# Patient Record
Sex: Male | Born: 1960 | Race: White | Hispanic: No | Marital: Married | Smoking: Never smoker
Health system: Southern US, Community
[De-identification: ages and names within clinical notes are randomized; demographics above are authoritative.]

## PROBLEM LIST (undated history)

## (undated) DIAGNOSIS — R5381 Other malaise: Secondary | ICD-10-CM

## (undated) DIAGNOSIS — J0100 Acute maxillary sinusitis, unspecified: Secondary | ICD-10-CM

## (undated) DIAGNOSIS — E785 Hyperlipidemia, unspecified: Secondary | ICD-10-CM

## (undated) DIAGNOSIS — J069 Acute upper respiratory infection, unspecified: Secondary | ICD-10-CM

## (undated) DIAGNOSIS — K219 Gastro-esophageal reflux disease without esophagitis: Secondary | ICD-10-CM

## (undated) DIAGNOSIS — Z136 Encounter for screening for cardiovascular disorders: Secondary | ICD-10-CM

## (undated) DIAGNOSIS — R5383 Other fatigue: Secondary | ICD-10-CM

## (undated) DIAGNOSIS — I10 Essential (primary) hypertension: Secondary | ICD-10-CM

## (undated) DIAGNOSIS — K644 Residual hemorrhoidal skin tags: Secondary | ICD-10-CM

## (undated) DIAGNOSIS — J9383 Other pneumothorax: Secondary | ICD-10-CM

## (undated) DIAGNOSIS — R609 Edema, unspecified: Secondary | ICD-10-CM

## (undated) HISTORY — PX: LAPAROSCOPIC INGUINAL HERNIA REPAIR: SUR788

## (undated) HISTORY — DX: Other pneumothorax: J93.83

## (undated) HISTORY — DX: Residual hemorrhoidal skin tags: K64.4

## (undated) HISTORY — DX: Hyperlipidemia, unspecified: E78.5

## (undated) HISTORY — DX: Gastro-esophageal reflux disease without esophagitis: K21.9

## (undated) HISTORY — DX: Acute maxillary sinusitis, unspecified: J01.00

## (undated) HISTORY — DX: Acute upper respiratory infection, unspecified: J06.9

## (undated) HISTORY — PX: TALC PLEURODESIS: SHX2506

## (undated) HISTORY — DX: Other malaise: R53.83

## (undated) HISTORY — DX: Edema, unspecified: R60.9

## (undated) HISTORY — DX: Encounter for screening for cardiovascular disorders: Z13.6

## (undated) HISTORY — PX: INGUINAL HERNIA REPAIR: SUR1180

## (undated) HISTORY — DX: Essential (primary) hypertension: I10

## (undated) HISTORY — PX: OTHER SURGICAL HISTORY: SHX169

## (undated) HISTORY — DX: Other malaise: R53.81

## (undated) HISTORY — PX: HEMORRHOID SURGERY: SHX153

---

## 1985-03-01 HISTORY — PX: THORACOTOMY: SUR1349

## 1986-03-01 HISTORY — PX: COLOSTOMY REVERSAL: SHX5782

## 1986-03-01 HISTORY — PX: EXPLORATORY LAPAROTOMY: SUR591

## 1995-03-02 HISTORY — PX: THORACOTOMY: SUR1349

## 2003-11-30 ENCOUNTER — Encounter: Payer: Self-pay | Admitting: General Practice

## 2003-12-31 ENCOUNTER — Encounter: Payer: Self-pay | Admitting: General Practice

## 2004-03-01 DIAGNOSIS — Z136 Encounter for screening for cardiovascular disorders: Secondary | ICD-10-CM

## 2004-03-01 HISTORY — DX: Encounter for screening for cardiovascular disorders: Z13.6

## 2004-05-04 ENCOUNTER — Ambulatory Visit: Payer: Self-pay | Admitting: Family Medicine

## 2004-05-11 ENCOUNTER — Ambulatory Visit: Payer: Self-pay | Admitting: Family Medicine

## 2004-11-16 ENCOUNTER — Ambulatory Visit: Payer: Self-pay | Admitting: Urology

## 2009-07-22 LAB — HEPATIC FUNCTION PANEL
ALT: 59 U/L — AB (ref 10–40)
AST: 41 U/L — AB (ref 14–40)
Bilirubin, Total: 1.3 mg/dL

## 2010-03-01 HISTORY — PX: INGUINAL HERNIA REPAIR: SUR1180

## 2010-10-15 ENCOUNTER — Encounter: Payer: Self-pay | Admitting: Internal Medicine

## 2010-10-26 ENCOUNTER — Encounter: Payer: Self-pay | Admitting: Internal Medicine

## 2010-10-26 ENCOUNTER — Other Ambulatory Visit: Payer: Self-pay | Admitting: Internal Medicine

## 2010-10-26 ENCOUNTER — Ambulatory Visit (INDEPENDENT_AMBULATORY_CARE_PROVIDER_SITE_OTHER): Payer: Managed Care, Other (non HMO) | Admitting: Internal Medicine

## 2010-10-26 DIAGNOSIS — R079 Chest pain, unspecified: Secondary | ICD-10-CM

## 2010-10-26 DIAGNOSIS — E785 Hyperlipidemia, unspecified: Secondary | ICD-10-CM

## 2010-10-26 DIAGNOSIS — Z79899 Other long term (current) drug therapy: Secondary | ICD-10-CM

## 2010-10-26 DIAGNOSIS — I10 Essential (primary) hypertension: Secondary | ICD-10-CM

## 2010-10-26 LAB — COMPREHENSIVE METABOLIC PANEL
ALT: 34 U/L (ref 0–53)
CO2: 29 mEq/L (ref 19–32)
Calcium: 9.3 mg/dL (ref 8.4–10.5)
Chloride: 105 mEq/L (ref 96–112)
GFR: 83.4 mL/min (ref >60.00)
Glucose, Bld: 97 mg/dL (ref 70–99)
Sodium: 141 mEq/L (ref 135–145)
Total Protein: 7.7 g/dL (ref 6.0–8.3)

## 2010-10-26 LAB — LIPID PANEL
Total CHOL/HDL Ratio: 3
Triglycerides: 51 mg/dL (ref 0.0–149.0)

## 2010-10-26 NOTE — Patient Instructions (Signed)
I am referring you for a CARDIOLOGIC EVALUATION OF YOUR CHEST PAIN. If your evaluation is normal, we will then refer you back to Dr Carl Best for your colonoscopy and to evaluate your esophagus for spasms.   We did not change any of your medications today

## 2010-10-26 NOTE — Progress Notes (Signed)
  Subjective:    Patient ID: Jonathan Simon, male    DOB: 1960-12-10, 50 y.o.   MRN: [id number]  HPI  Mr. Artman is a 50 yr old male with a history of hypertension and reflux , last seen Sept 2011 who presents with recurrent episodes of sharp left sided chest pain  accompanied by dizziness without nausea or diaphoresis.  He reports feeling hard heart beat but no racing or irregular pulse.  The episodes last  about 3 to 5 minutes occurring about once or twice daily for the past month and have been occurring both at rest and with exertion.  Has denies any  Overt reflux symptoms but has also noticed periodic episodes of dysphagia for all food types  and liquids.   Review of Systems  Constitutional: Negative for fever, chills, activity change, appetite change, fatigue and unexpected weight change.  Eyes: Negative for visual disturbance.  Respiratory: Negative for cough and shortness of breath.   Cardiovascular: Negative for chest pain, palpitations and leg swelling.  Gastrointestinal: Negative for abdominal pain and abdominal distention.  Genitourinary: Negative for dysuria, urgency and difficulty urinating.  Musculoskeletal: Negative for arthralgias and gait problem.  Skin: Negative for color change and rash.  Hematological: Negative for adenopathy.  Psychiatric/Behavioral: Negative for sleep disturbance and dysphoric mood. The patient is not nervous/anxious.        Objective:   Physical Exam  Constitutional: He is oriented to person, place, and time. He appears well-developed and well-nourished.  HENT:  Head: Normocephalic.  Eyes: EOM are normal. Pupils are equal, round, and reactive to light.  Neck: Normal range of motion. No JVD present. No thyromegaly present.  Cardiovascular: Normal rate, regular rhythm and normal heart sounds.   Pulmonary/Chest: Effort normal and breath sounds normal.  Abdominal: Soft. Bowel sounds are normal. There is no tenderness. There is no rebound.    Musculoskeletal: Normal range of motion.  Lymphadenopathy:    He has no cervical adenopathy.  Neurological: He is alert and oriented to person, place, and time. He has normal reflexes.  Skin: Skin is warm and dry.  Psychiatric: He has a normal mood and affect.          Assessment & Plan:  Chest pain:  New onset, atypical , with cardiac risk factors of  HTN and relative hyperlipidemia (LDL 96 today).  EKG in office shows TWI in lead IIII only and his last episode was yesterday. Since the episodes are accomopanied by dizziness, we also need to rule out arrhythmias.  Will refer to Dossie Arbour for cardiac evaluation.  If cardiac eval is normal, will proceed with GI evaluation for esophageal spasm and overdue colonoscopy.   Hypertension:  Currently controlled,  No changes to regimen.   Hyperlipidemia: relative, LDL 96 which in the absenceof heart disease is fine.   will defer treatment for now.      Reflux:  continue omeprazole daily

## 2010-10-27 ENCOUNTER — Encounter: Payer: Self-pay | Admitting: Internal Medicine

## 2010-10-27 DIAGNOSIS — Z136 Encounter for screening for cardiovascular disorders: Secondary | ICD-10-CM | POA: Insufficient documentation

## 2010-10-27 DIAGNOSIS — J9383 Other pneumothorax: Secondary | ICD-10-CM | POA: Insufficient documentation

## 2010-10-27 LAB — TROPONIN I: Troponin I: <0.01 ng/mL (ref <0.06)

## 2010-10-27 NOTE — Progress Notes (Signed)
Can you check and see if the consult was placed for me in [facility] patient thx

## 2010-10-30 LAB — TROPONIN I: Troponin I: <0.01 ng/mL (ref <0.06)

## 2010-11-09 ENCOUNTER — Other Ambulatory Visit: Payer: Self-pay | Admitting: *Deleted

## 2010-11-09 MED ORDER — AMLODIPINE BESYLATE 10 MG PO TABS: 10.0000 mg | ORAL_TABLET | Freq: Every day | ORAL | Status: DC

## 2010-11-09 NOTE — Telephone Encounter (Signed)
Received faxed refill request from pharmacy. Is it okay to refill Amlodipine?

## 2010-11-10 ENCOUNTER — Encounter: Payer: Self-pay | Admitting: Cardiovascular Disease

## 2010-11-10 ENCOUNTER — Encounter: Payer: Self-pay | Admitting: *Deleted

## 2010-11-10 ENCOUNTER — Ambulatory Visit (INDEPENDENT_AMBULATORY_CARE_PROVIDER_SITE_OTHER): Payer: Managed Care, Other (non HMO) | Admitting: Cardiovascular Disease

## 2010-11-10 DIAGNOSIS — R079 Chest pain, unspecified: Secondary | ICD-10-CM | POA: Insufficient documentation

## 2010-11-10 DIAGNOSIS — R42 Dizziness and giddiness: Secondary | ICD-10-CM

## 2010-11-10 NOTE — Progress Notes (Signed)
Exercise Treadmill Test  Treadmill ordered for recent epsiodes of chest pain and dizziness.  Resting EKG shows NSR with rate of 63 bpm, no significant ST or T wave changes Resting blood pressure of 1230/80. Stand bruce protocal was used.  Patient exercised for 10 min Peak heart rate of 162 bpm.  This was 96% of the maximum predicted heart rate (target heart rate 168). No symptoms of chest pain or lightheadedness were reported at peak stress or in recovery.  Peak Blood pressure recorded was 164/98. Heart rate at 3 minutes in recovery was 79 bpm.  FINAL IMPRESSION: Normal exercise stress test. No significant EKG changes concerning for ischemia. Excellent exercise tolerance.

## 2010-11-10 NOTE — Progress Notes (Signed)
Patient ID: Jonathan Simon, male    DOB: 09-21-1960, 50 y.o.   MRN: [id number]  HPI Comments: Jonathan Simon is a very pleasant 50 year old gentleman, patient of Dr. Darrick Huntsman, who presents by referral for symptoms of arm pain, chest pain and dizziness.  He reports that he has had symptoms for 2 months, approximately 40 episodes. It starts as a ache in his arm, radiating to his chest described as a light pressure. He feels lightheaded for approximately 15 minutes and then it resolves on its own. Typically this occurs while he is standing or doing something. He is able to be very active with minimal symptoms. For example, he can push while once or twice a week on a incline with no significant symptoms.  He does have some trouble swallowing, sometimes after eating and wonders if it could be related.  It is uncertain if his symptoms are secondary to previous treatment for pneumothorax with corrective surgery bilaterally.  He states that his father was a smoker and had a heart attack and passed away at age 35. Grandfather passed away at 54. He is otherwise healthy, active, no smoking history, no diabetes.   EKG shows normal sinus rhythm with rate 60 beats per minute with no significant ST or T wave changes ( borderline T wave in lead 3)   Outpatient Encounter Prescriptions as of 11/10/2010  Medication Sig Dispense Refill  . amLODipine (NORVASC) 10 MG tablet Take 1 tablet (10 mg total) by mouth daily.  90 tablet  3  . budesonide-formoterol (SYMBICORT) 160-4.5 MCG/ACT inhaler Inhale 2 puffs into the lungs daily.        . fluticasone (VERAMYST) 27.5 MCG/SPRAY nasal spray Place 2 sprays into the nose daily.        Marland Kitchen lisinopril (PRINIVIL,ZESTRIL) 40 MG tablet Take 40 mg by mouth daily.        Marland Kitchen losartan (COZAAR) 50 MG tablet Take 50 mg by mouth daily.        . niacin 500 MG tablet Take 500 mg by mouth daily with breakfast.        . omeprazole (PRILOSEC) 40 MG capsule Take 40 mg by mouth daily.        .  pseudoephedrine (SUDAFED) 30 MG tablet Take 30 mg by mouth every 6 (six) hours as needed.        . simvastatin (ZOCOR) 20 MG tablet Take 10 mg by mouth at bedtime.           Review of Systems  Constitutional: Negative.   HENT: Negative.   Eyes: Negative.   Respiratory: Negative.   Cardiovascular: Positive for chest pain.       Pain radiating to the arm on left  Gastrointestinal: Negative.   Musculoskeletal: Negative.   Skin: Negative.   Neurological: Positive for dizziness.  Hematological: Negative.   Psychiatric/Behavioral: Negative.   All other systems reviewed and are negative.    BP 157/94  Pulse 60  Ht 5\' 10"  (1.778 m)  Wt 210 lb (95.255 kg)  BMI 30.13 kg/m2  Physical Exam  Nursing note and vitals reviewed. Constitutional: He is oriented to person, place, and time. He appears well-developed and well-nourished.  HENT:  Head: Normocephalic.  Nose: Nose normal.  Mouth/Throat: Oropharynx is clear and moist.  Eyes: Conjunctivae are normal. Pupils are equal, round, and reactive to light.  Neck: Normal range of motion. Neck supple. No JVD present.  Cardiovascular: Normal rate, regular rhythm, S1 normal, S2 normal, normal heart sounds and intact  distal pulses.  Exam reveals no gallop and no friction rub.   No murmur heard. Pulmonary/Chest: Effort normal and breath sounds normal. No respiratory distress. He has no wheezes. He has no rales. He exhibits no tenderness.  Abdominal: Soft. Bowel sounds are normal. He exhibits no distension. There is no tenderness.  Musculoskeletal: Normal range of motion. He exhibits no edema and no tenderness.  Lymphadenopathy:    He has no cervical adenopathy.  Neurological: He is alert and oriented to person, place, and time. Coordination normal.  Skin: Skin is warm and dry. No rash noted. No erythema.  Psychiatric: He has a normal mood and affect. His behavior is normal. Judgment and thought content normal.           Assessment and Plan

## 2010-11-10 NOTE — Assessment & Plan Note (Signed)
He does have dizziness with his episodes of chest pain. Uncertain if this could be secondary to arrhythmia. I have asked him to by a automatic blood pressure cuff or borrow one and monitor his blood pressure and heart rate when he has these episodes. If he has hypotension or underlying arrhythmia have asked him to contact the office for further evaluation. If heart rhythm is abnormal, we could perform a Holter monitor. If he is hypotensive, we could change his blood pressure medication.

## 2010-11-10 NOTE — Patient Instructions (Signed)
We will perform a treadmill today to rule out heart blockage. Please monitor your blood pressure and heart rate at home when you have the episodes of chest pain and dizziness. Call the office if you have low blood pressure or arrhythmia/low heart rate.  If it appears that you are having arrhythmia, we will order a holter monitor.  No medication changes were made. Please call us if you have new issues that need to be addressed

## 2010-11-10 NOTE — Assessment & Plan Note (Signed)
Etiology of his chest pain is uncertain though somewhat atypical for cardiac. We will schedule him for a routine treadmill study. His symptoms seem to present at rest, not as much with exertion also ordering for noncardiac etiology. He does have some dysphasia and he may benefit from followup with GI.

## 2010-11-10 NOTE — Progress Notes (Signed)
Addended by: Phoebe Sharps on: 11/10/2010 01:07 PM   Modules accepted: Level of Service

## 2010-12-11 ENCOUNTER — Other Ambulatory Visit: Payer: Self-pay | Admitting: Internal Medicine

## 2010-12-11 MED ORDER — OMEPRAZOLE 40 MG PO CPDR: 40.0000 mg | DELAYED_RELEASE_CAPSULE | Freq: Every day | ORAL | Status: DC

## 2010-12-29 ENCOUNTER — Emergency Department: Payer: Self-pay | Admitting: Emergency Medicine

## 2010-12-29 IMAGING — CT CT ABD-PELV W/ CM
1 of 3 series · 14 of 32 positions shown, 19 images · IV contrast (isovue)
Comparison: none

REASON FOR EXAM: (1) rlq pain and loss of appetite xc 2 weeks; (2) abd
pain
COMMENTS:

PROCEDURE:     CT  - CT ABDOMEN / PELVIS  W  - [DATE]  [DATE]
RESULT:     Comparison:  None
TECHNIQUE: Multiple axial images of the abdomen and pelvis were performed
from the lung bases to the pubic symphysis, with p.o. contrast and with 100
mL of Isovue 370 intravenous contrast.

[Series 2: 3mm soft tissue · axial · 0.79mm/px · z∈[-584,-134]mm · 14 of 168 slices shown, 19 images]
[im 9/168  soft-tissue]
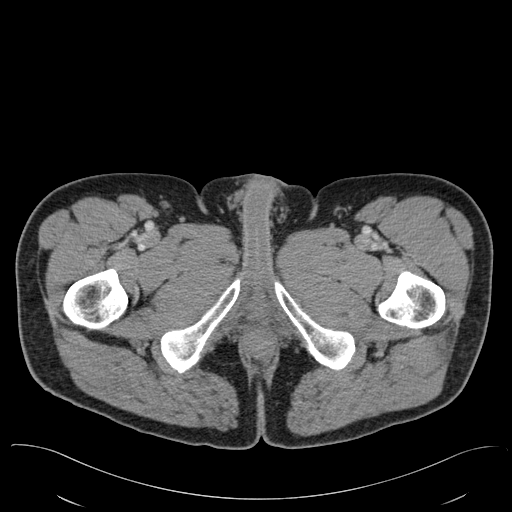
[im 9/168  bone]
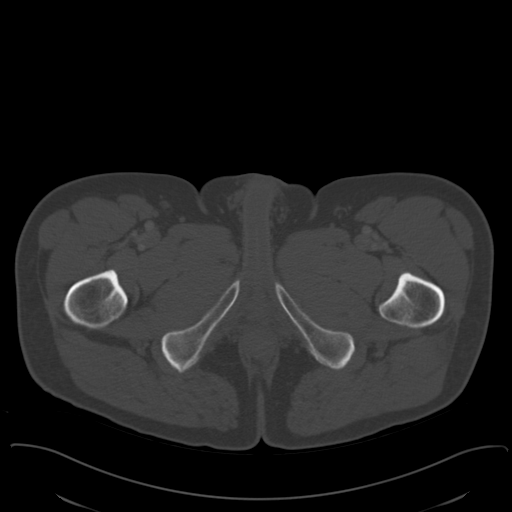
[im 27/168  soft-tissue]
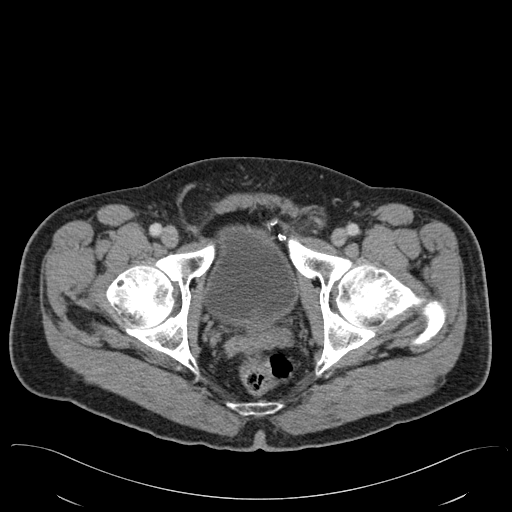
[im 36/168  soft-tissue]
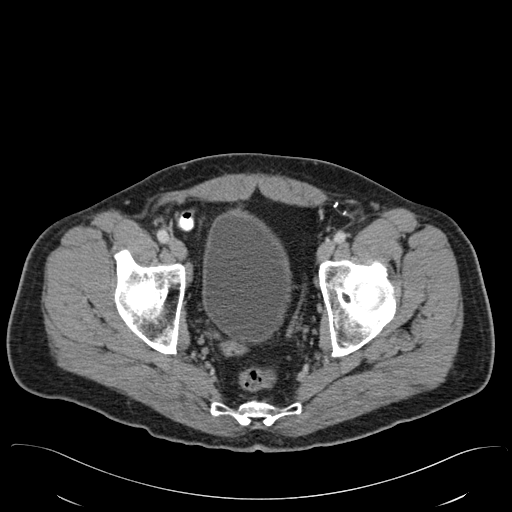
[im 44/168  soft-tissue]
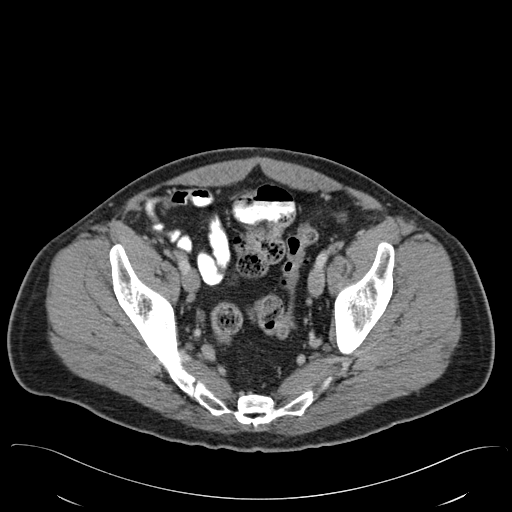
[im 62/168  soft-tissue]
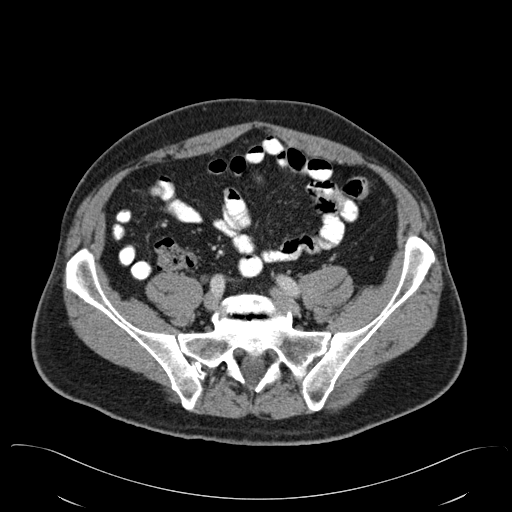
[im 71/168  soft-tissue]
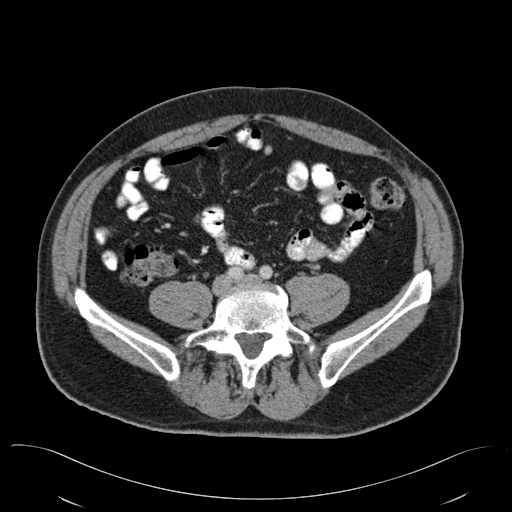
[im 88/168  soft-tissue]
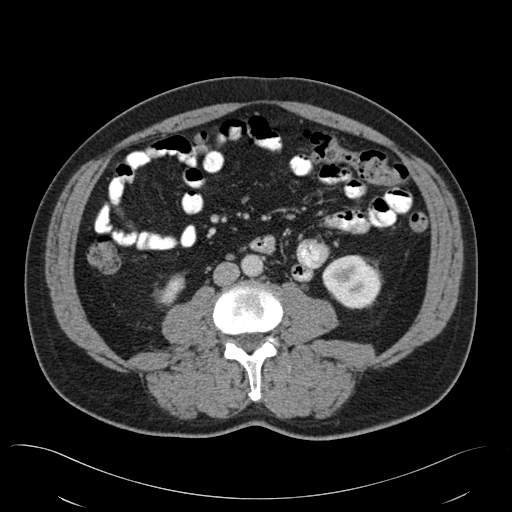
[im 97/168  soft-tissue]
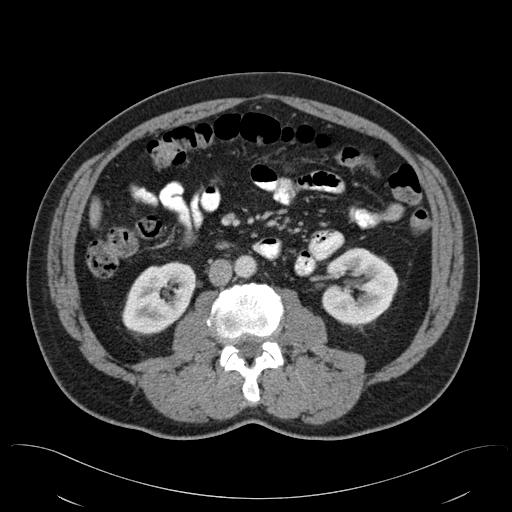
[im 106/168  soft-tissue]
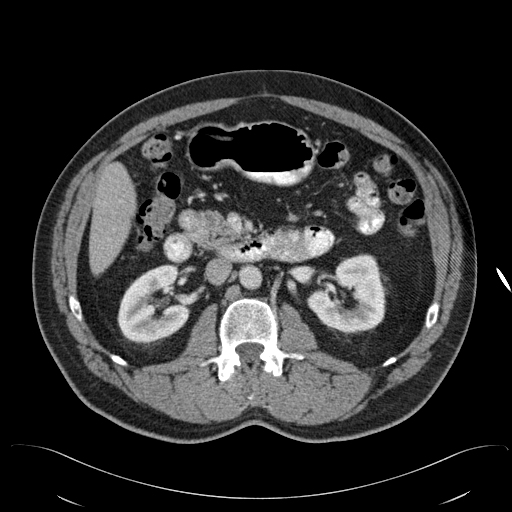
[im 106/168  bone]
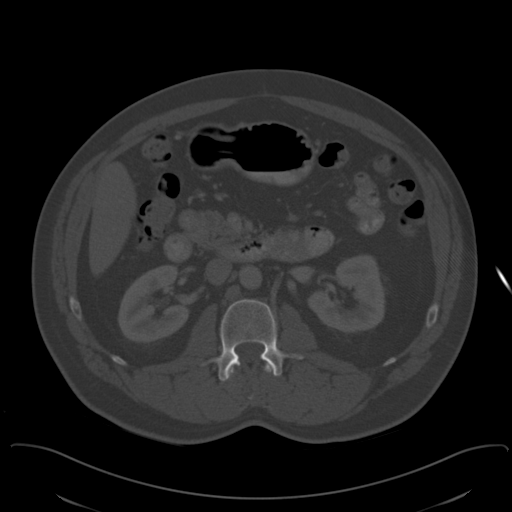
[im 124/168  soft-tissue]
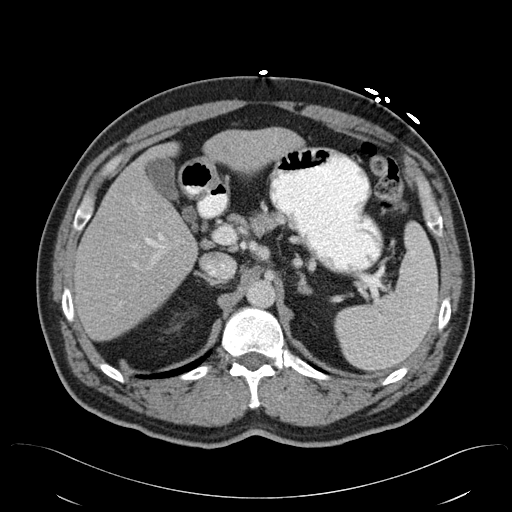
[im 132/168  soft-tissue]
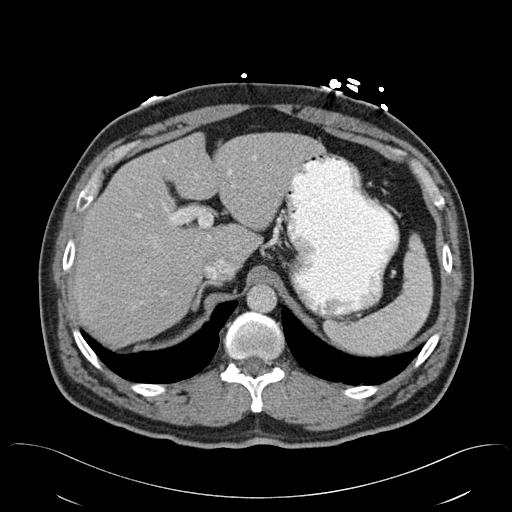
[im 132/168  lung]
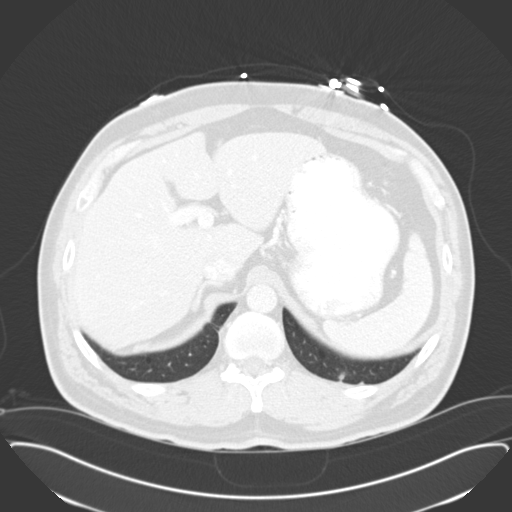
[im 141/168  soft-tissue]
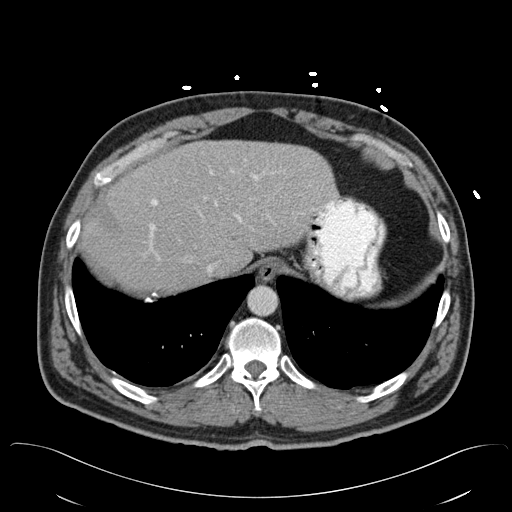
[im 141/168  lung]
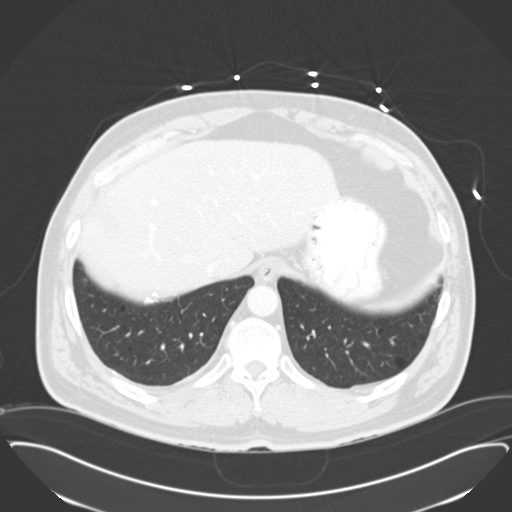
[im 150/168  lung]
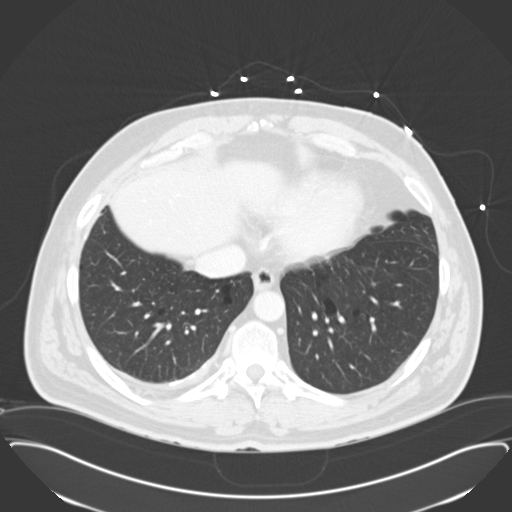
[im 159/168  soft-tissue]
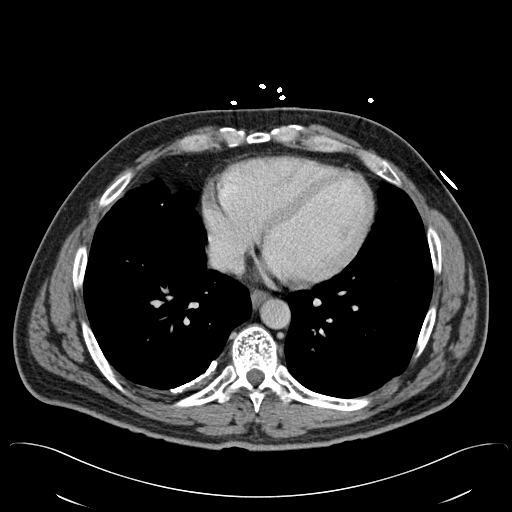
[im 159/168  lung]
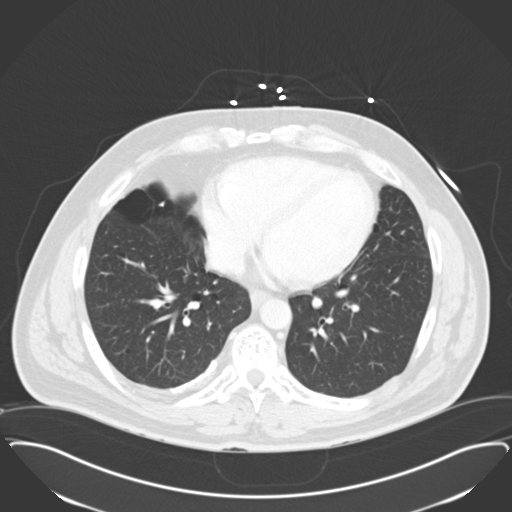

[14 of 32 positions shown; findings below may reference images not displayed]

FINDINGS: There are emphysematous changes in the right middle lobe and lingula, with
multiple bulla in the right middle lobe. There is mild centrilobular
emphysema in the lower lobes. Minimal linear opacities in the left lung base
likely represent atelectasis or scarring. There are calcified pleural
plaques in the right lower lobe, likely sequela of prior asbestos exposure.

Minimal low-attenuation along falciform ligament likely represents focal
fatty deposition. The gallbladder, spleen, adrenals, and pancreas are
unremarkable. There is a duodenal diverticulum extending from the second
portion the duodenum into the head of the pancreas. There is a 1.7 cm
low-attenuation lesion in the left kidney. This is indeterminate on this
study, but may represent a cyst. There may be a few internal septations.

The small and large bowel are normal in caliber. The appendix is not
visualized. However, there are no inflammatory changes at the base of the
cecum. There is a fat containing right inguinal hernia. Postsurgical changes
seen from left inguinal hernia repair.

No aggressive lytic or sclerotic osseous lesions are identified.
IMPRESSION: 1. No acute findings in the abdomen or pelvis.
2. Fat containing right inguinal hernia.
3. Small low-attenuation lesion in the left kidney may represent a cyst, but
is indeterminate. Further evaluation with dedicated multiphasic
contrast-enhanced renal CT or MRI is recommended.
4. Emphysematous changes at the lung bases.

## 2011-02-19 ENCOUNTER — Ambulatory Visit: Payer: Self-pay | Admitting: Surgery

## 2011-08-06 ENCOUNTER — Other Ambulatory Visit: Payer: Self-pay | Admitting: Internal Medicine

## 2011-09-20 ENCOUNTER — Other Ambulatory Visit: Payer: Self-pay | Admitting: Internal Medicine

## 2011-11-23 ENCOUNTER — Other Ambulatory Visit: Payer: Self-pay | Admitting: Internal Medicine

## 2011-11-23 NOTE — Telephone Encounter (Signed)
Patient hasn't been seen since 09/2010, ok to refill?

## 2011-11-23 NOTE — Telephone Encounter (Signed)
30 days  Only,  Needs to be seen

## 2011-12-07 ENCOUNTER — Encounter: Payer: Self-pay | Admitting: Internal Medicine

## 2011-12-07 ENCOUNTER — Ambulatory Visit (INDEPENDENT_AMBULATORY_CARE_PROVIDER_SITE_OTHER): Payer: Managed Care, Other (non HMO) | Admitting: Internal Medicine

## 2011-12-07 VITALS — BP 118/78 | HR 60 | Temp 98.7°F | Ht 70.0 in | Wt 215.2 lb

## 2011-12-07 DIAGNOSIS — Z0000 Encounter for general adult medical examination without abnormal findings: Secondary | ICD-10-CM

## 2011-12-07 DIAGNOSIS — Z1211 Encounter for screening for malignant neoplasm of colon: Secondary | ICD-10-CM

## 2011-12-07 DIAGNOSIS — D126 Benign neoplasm of colon, unspecified: Secondary | ICD-10-CM | POA: Insufficient documentation

## 2011-12-07 DIAGNOSIS — I10 Essential (primary) hypertension: Secondary | ICD-10-CM | POA: Insufficient documentation

## 2011-12-07 DIAGNOSIS — Z23 Encounter for immunization: Secondary | ICD-10-CM

## 2011-12-07 DIAGNOSIS — Z13220 Encounter for screening for lipoid disorders: Secondary | ICD-10-CM

## 2011-12-07 DIAGNOSIS — Z79899 Other long term (current) drug therapy: Secondary | ICD-10-CM

## 2011-12-07 DIAGNOSIS — Z125 Encounter for screening for malignant neoplasm of prostate: Secondary | ICD-10-CM

## 2011-12-07 DIAGNOSIS — E785 Hyperlipidemia, unspecified: Secondary | ICD-10-CM

## 2011-12-07 MED ORDER — ALBUTEROL SULFATE HFA 108 (90 BASE) MCG/ACT IN AERS: 2.0000 | INHALATION_SPRAY | Freq: Four times a day (QID) | RESPIRATORY_TRACT | Status: AC | PRN

## 2011-12-07 NOTE — Patient Instructions (Addendum)
[employer] makes a low carb tortilla "CARB BALANCE"   6 NET Carbs   ,  26 g fibers  ([employer])   You received the TDaP shot today

## 2011-12-07 NOTE — Assessment & Plan Note (Signed)
Annual PE with DRE and testicular exam done. Patient will return for fasting labs.

## 2011-12-07 NOTE — Assessment & Plan Note (Signed)
Managed with zocor.  Fasting lipids are pending

## 2011-12-07 NOTE — Progress Notes (Signed)
Patient ID: Jonathan Simon, male   DOB: November 24, 1960, 51 y.o.   MRN: [id number]  Patient Active Problem List  Diagnosis  . Spontaneous pneumothorax  . Treadmill stress test negative for angina pectoris  . Routine general medical examination at a health care facility  . Hypertension  . Hyperlipidemia LDL goal < 100    Subjective:  CC:   Chief Complaint  Patient presents with  . Annual Exam    HPI:   Jonathan Phillipsis a 51 y.o. male who presents  Past Medical History  Diagnosis Date  . Essential hypertension, benign   . Acute upper respiratory infections of unspecified site   . Acute maxillary sinusitis   . External hemorrhoids without mention of complication   . Esophageal reflux   . Other malaise and fatigue   . Other and unspecified hyperlipidemia   . Edema   . Spontaneous pneumothorax     bilateral s/p surgery/pleurodesis [facility]   . Treadmill stress test negative for angina pectoris 2006    [facility]    Past Surgical History  Procedure Date  . [place] pleurodesis     of bilateral pneumothrax  . Inguinal hernia repair     bilateral  . 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 Hemorrhoid surgery          The following portions of the patient's history were reviewed and updated as appropriate: Allergies, current medications, and problem list.    Review of Systems:   12 Pt  review of systems was negative except those addressed in the HPI,     History   Social History  . Marital Status: Married    Spouse Name: N/A    Number of Children: N/A  . Years of Education: N/A   Occupational History  . Not on file.   Social History Main Topics  . Smoking status: Never Smoker   . Smokeless tobacco: Never Used  . Alcohol Use: Yes     occasional  . Drug Use: No  . Sexually Active: Not on file   Other Topics Concern  . Not on file   Social History Narrative  . No narrative on file    Objective: BP 118/78  Pulse 60  Temp 98.7  F (37.1 C) (Oral)  Ht 5\' 10"  (1.778 m)  Wt 215 lb 4 oz (97.637 kg)  BMI 30.89 kg/m2  SpO2 98%  General Appearance:    Alert, cooperative, no distress, appears stated age  Head:    Normocephalic, without obvious abnormality, atraumatic  Eyes:    PERRL, conjunctiva/corneas clear, EOM's intact, fundi    benign, both eyes       Ears:    Normal TM's and external ear canals, both ears  Nose:   Nares normal, septum midline, mucosa normal, no drainage   or sinus tenderness  Throat:   Lips, mucosa, and tongue normal; teeth and gums normal  Neck:   Supple, symmetrical, trachea midline, no adenopathy;       thyroid:  No enlargement/tenderness/nodules; no carotid   bruit or JVD  Back:     Symmetric, no curvature, ROM normal, no CVA tenderness  Lungs:     Clear to auscultation bilaterally, respirations unlabored  Chest wall:    No tenderness or deformity  Heart:    Regular rate and rhythm, S1 and S2 normal, no murmur, rub   or gallop  Abdomen:     Soft, non-tender, bowel sounds active all four quadrants,  no masses, no organomegaly. Vertical midline scar  Genitalia:    Normal male without lesion, discharge or tenderness  Rectal:    Normal tone, normal prostate, no masses or tenderness;     Extremities:   Extremities normal, atraumatic, no cyanosis or edema  Pulses:   2+ and symmetric all extremities  Skin:   Skin color, texture, turgor normal, no rashes or lesions  Lymph nodes:   Cervical, supraclavicular, and axillary nodes normal  Neurologic:   CNII-XII intact. Normal strength, sensation and reflexes      throughout   Skin: Skin color, texture, turgor normal. No rashes or lesions Lymph nodes: Cervical, supraclavicular, and axillary nodes normal.  Assessment and Plan:  Routine general medical examination at a health care facility Annual PE with DRE and testicular exam done. Patient will return for fasting labs.   Hypertension Well controlled on current regimen. Renal function  pending , no changes today.  Hyperlipidemia LDL goal < 100 Managed with zocor.  Fasting lipids are pending   Screening for colon cancer Referral to [facility] for screening colonoscopy   Updated Medication List Outpatient Encounter Prescriptions as of 12/07/2011  Medication Sig Dispense Refill  . amLODipine (NORVASC) 10 MG tablet TAKE ONE (1) TABLET EACH DAY  30 tablet  0  . dexlansoprazole (DEXILANT) 60 MG capsule Take 60 mg by mouth daily.      . fluticasone (VERAMYST) 27.5 MCG/SPRAY nasal spray Place 2 sprays into the nose daily.        Marland Kitchen losartan (COZAAR) 50 MG tablet TAKE 1 TABLET BY MOUTH EVERY DAY.  90 tablet  3  . niacin 500 MG tablet Take 500 mg by mouth daily with breakfast.        . pseudoephedrine (SUDAFED) 30 MG tablet Take 30 mg by mouth every 6 (six) hours as needed.        . simvastatin (ZOCOR) 20 MG tablet TAKE 1/2 TABLET BY MOUTH EVERY DAY  45 tablet  3  . DISCONTD: budesonide-formoterol (SYMBICORT) 160-4.5 MCG/ACT inhaler Inhale 2 puffs into the lungs daily.       Marland Kitchen albuterol (PROVENTIL HFA;VENTOLIN HFA) 108 (90 BASE) MCG/ACT inhaler Inhale 2 puffs into the lungs every 6 (six) hours as needed for wheezing.  1 Inhaler  0  . lisinopril (PRINIVIL,ZESTRIL) 40 MG tablet Take 40 mg by mouth daily.        Marland Kitchen DISCONTD: omeprazole (PRILOSEC) 40 MG capsule Take 1 capsule (40 mg total) by mouth daily.  90 capsule  3

## 2011-12-07 NOTE — Assessment & Plan Note (Signed)
Well controlled on current regimen. Renal function pending, no changes today. 

## 2011-12-07 NOTE — Assessment & Plan Note (Signed)
Referral to [facility] for screening colonoscopy

## 2011-12-09 ENCOUNTER — Other Ambulatory Visit: Payer: Managed Care, Other (non HMO)

## 2011-12-16 ENCOUNTER — Other Ambulatory Visit (INDEPENDENT_AMBULATORY_CARE_PROVIDER_SITE_OTHER): Payer: Managed Care, Other (non HMO)

## 2011-12-16 DIAGNOSIS — Z125 Encounter for screening for malignant neoplasm of prostate: Secondary | ICD-10-CM

## 2011-12-16 DIAGNOSIS — Z79899 Other long term (current) drug therapy: Secondary | ICD-10-CM

## 2011-12-16 DIAGNOSIS — Z13220 Encounter for screening for lipoid disorders: Secondary | ICD-10-CM

## 2011-12-16 LAB — PSA: PSA: 0.83 ng/mL (ref 0.10–4.00)

## 2011-12-16 LAB — LIPID PANEL
Cholesterol: 121 mg/dL (ref 0–200)
LDL Cholesterol: 90 mg/dL (ref 0–99)
Total CHOL/HDL Ratio: 9
Triglycerides: 92 mg/dL (ref 0.0–149.0)
VLDL: 18.4 mg/dL (ref 0.0–40.0)

## 2011-12-16 LAB — COMPREHENSIVE METABOLIC PANEL
ALT: 41 U/L (ref 0–53)
Albumin: 3.7 g/dL (ref 3.5–5.2)
CO2: 29 mEq/L (ref 19–32)
Calcium: 9.2 mg/dL (ref 8.4–10.5)
Chloride: 102 mEq/L (ref 96–112)
GFR: 80.25 mL/min (ref >60.00)
Potassium: 4.5 mEq/L (ref 3.5–5.1)
Sodium: 138 mEq/L (ref 135–145)
Total Protein: 6.9 g/dL (ref 6.0–8.3)

## 2011-12-17 ENCOUNTER — Encounter: Payer: Managed Care, Other (non HMO) | Admitting: Internal Medicine

## 2011-12-27 ENCOUNTER — Other Ambulatory Visit: Payer: Self-pay | Admitting: Internal Medicine

## 2011-12-28 ENCOUNTER — Telehealth: Payer: Self-pay | Admitting: Internal Medicine

## 2011-12-28 NOTE — Telephone Encounter (Signed)
Refill request for dexilant 60 mg cer #90 Sig: take one capsule by mouth every morning 30 to 45 minutes before first meal

## 2011-12-29 ENCOUNTER — Other Ambulatory Visit: Payer: Self-pay

## 2011-12-29 MED ORDER — DEXLANSOPRAZOLE 60 MG PO CPDR: 60.0000 mg | DELAYED_RELEASE_CAPSULE | Freq: Every day | ORAL | Status: DC

## 2011-12-29 NOTE — Telephone Encounter (Signed)
Refill request for Dexilant 60 mg #30 ok to refill?

## 2012-05-09 ENCOUNTER — Telehealth: Payer: Self-pay | Admitting: General Practice

## 2012-05-09 NOTE — Telephone Encounter (Signed)
Pt called and states that his insurance will no longer cover Dexilant. Pt was advised to call insurance company to find out what alternative they will cover. Waiting for pt to return call.

## 2012-05-10 NOTE — Telephone Encounter (Signed)
Received a call from [facility] wanting to know what the doctor would like to change the Dexilant to. Advised pharmacist that we are waiting to hear back from the patient as to what his insurance will cover at a lower cost. Spoke to patient and advised him that we are waiting to hear from him about what his insurance will cover for a lower cost. Patient states that he has been without power, but will make the call now and call back with the information.

## 2012-05-26 NOTE — Telephone Encounter (Signed)
HIS FORMULARY dose not cover PPIs. There are at least two available OTC:  prilosec OTC and zegerid OTC 1 daily .  If he has a Government social research officer he can get the generic prilosec (omeprazole 20 mg ) and take 1 or 2 daily

## 2012-05-26 NOTE — Telephone Encounter (Signed)
Pt had dropped off a formulary to the office however there is no mention of PPI's. This was placed in your red folder on 3/27.

## 2012-05-26 NOTE — Telephone Encounter (Signed)
Pt wife notified. Will notify the office as to what formulary tier they fall under.

## 2012-10-03 ENCOUNTER — Other Ambulatory Visit: Payer: Self-pay | Admitting: Internal Medicine

## 2012-10-13 ENCOUNTER — Ambulatory Visit (INDEPENDENT_AMBULATORY_CARE_PROVIDER_SITE_OTHER): Payer: Managed Care, Other (non HMO) | Admitting: Internal Medicine

## 2012-10-13 ENCOUNTER — Encounter: Payer: Self-pay | Admitting: Internal Medicine

## 2012-10-13 ENCOUNTER — Ambulatory Visit: Payer: Self-pay | Admitting: Internal Medicine

## 2012-10-13 VITALS — BP 124/80 | HR 52 | Temp 98.2°F | Resp 14 | Wt 203.5 lb

## 2012-10-13 DIAGNOSIS — M62838 Other muscle spasm: Secondary | ICD-10-CM

## 2012-10-13 DIAGNOSIS — I10 Essential (primary) hypertension: Secondary | ICD-10-CM

## 2012-10-13 DIAGNOSIS — Z79899 Other long term (current) drug therapy: Secondary | ICD-10-CM

## 2012-10-13 DIAGNOSIS — Z6825 Body mass index (BMI) 25.0-25.9, adult: Secondary | ICD-10-CM

## 2012-10-13 DIAGNOSIS — M26609 Unspecified temporomandibular joint disorder, unspecified side: Secondary | ICD-10-CM | POA: Insufficient documentation

## 2012-10-13 DIAGNOSIS — E785 Hyperlipidemia, unspecified: Secondary | ICD-10-CM

## 2012-10-13 DIAGNOSIS — E663 Overweight: Secondary | ICD-10-CM | POA: Insufficient documentation

## 2012-10-13 LAB — LIPID PANEL
Cholesterol: 142 mg/dL (ref 0–200)
LDL Cholesterol: 87 mg/dL (ref 0–99)

## 2012-10-13 LAB — COMPREHENSIVE METABOLIC PANEL
ALT: 32 U/L (ref 0–53)
AST: 31 U/L (ref 0–37)
Alkaline Phosphatase: 57 U/L (ref 39–117)
CO2: 29 mEq/L (ref 19–32)
Creatinine, Ser: 1.1 mg/dL (ref 0.4–1.5)
GFR: 71.88 mL/min (ref >60.00)
Total Bilirubin: 1.5 mg/dL — ABNORMAL HIGH (ref 0.3–1.2)

## 2012-10-13 LAB — TSH: TSH: 0.97 u[IU]/mL (ref 0.35–5.50)

## 2012-10-13 IMAGING — CR MANDIBLE - 4+ VIEW
1 series · 4 of 4 positions shown · non-contrast
Comparison: none

REASON FOR EXAM: left sides TMJ pain
COMMENTS:

PROCEDURE:     DXR - DXR MANDIBLE COMP WITH OBLIQUES  - [DATE]  [DATE]
RESULT:     Findings: 4 views of the mandible are provided. There is no
fracture or dislocation. The visualized paranasal sinuses are clear.

[Series 1: (person_name) · 0.14mm/px · 4 of 4 slices shown]
[im 1/4]
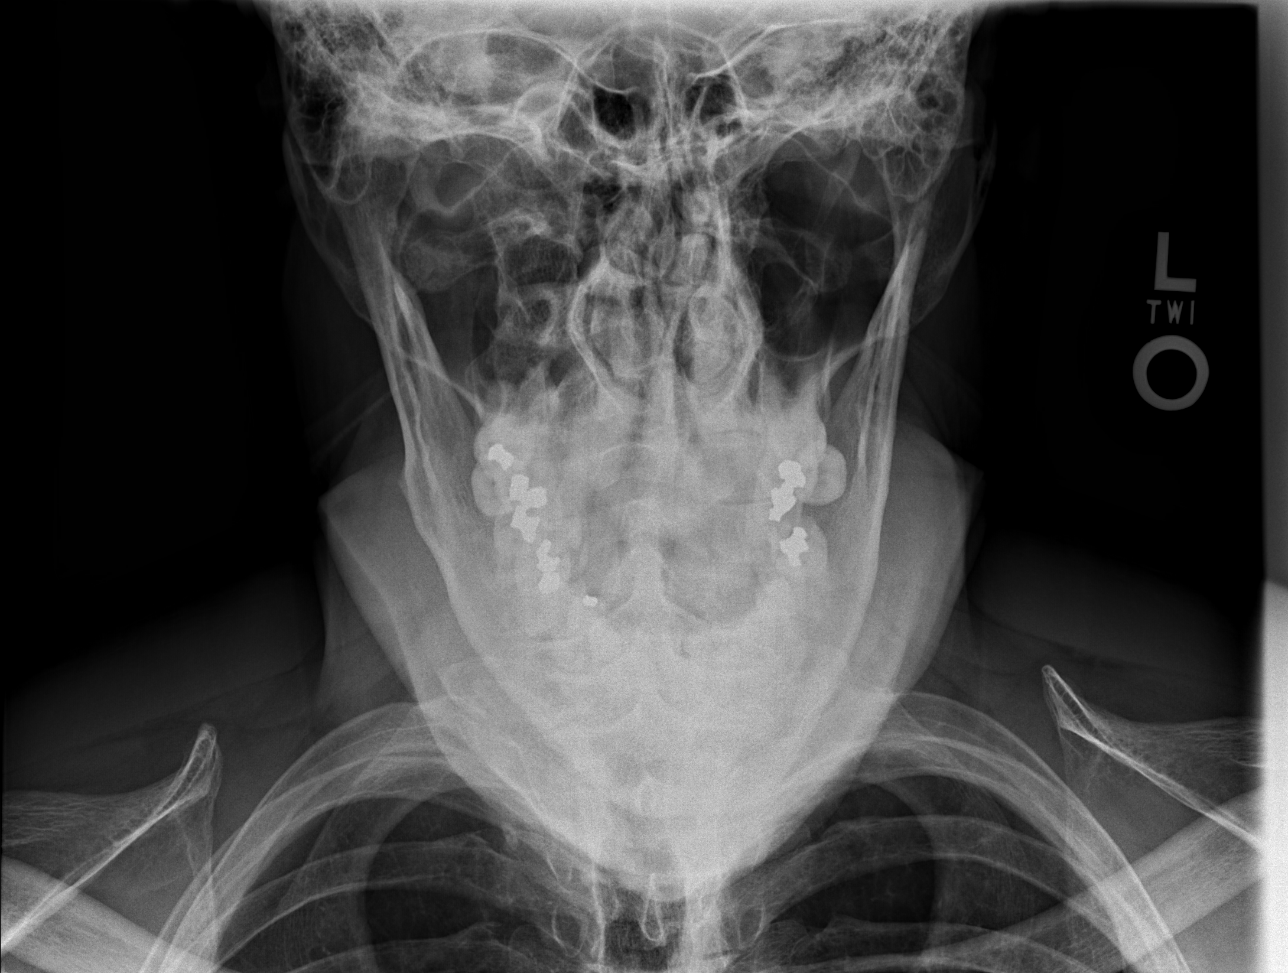
[im 2/4]
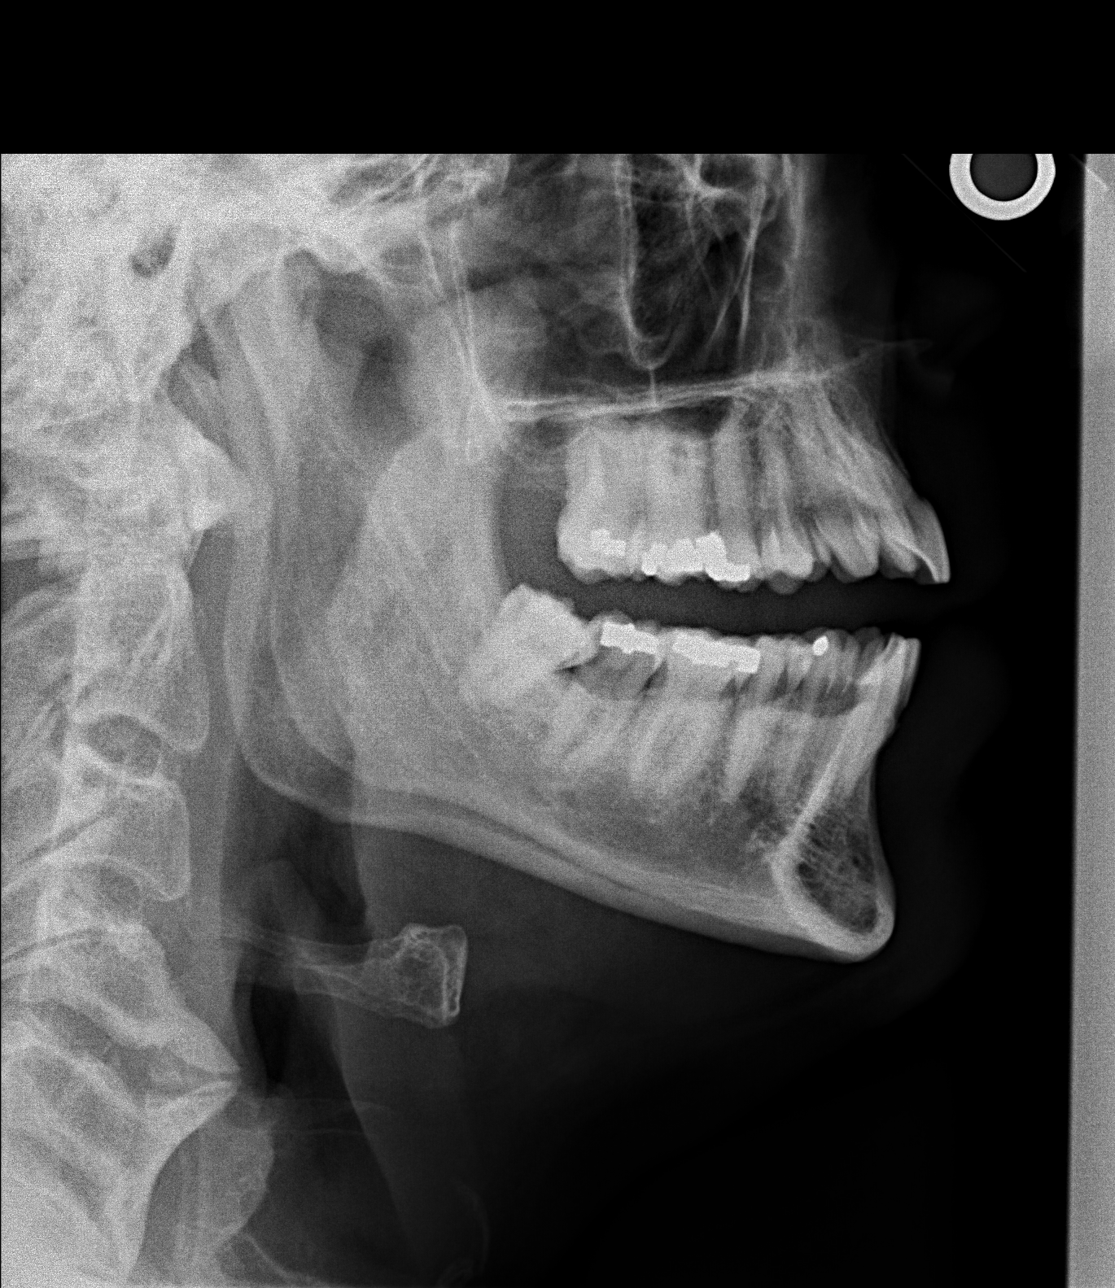
[im 3/4]
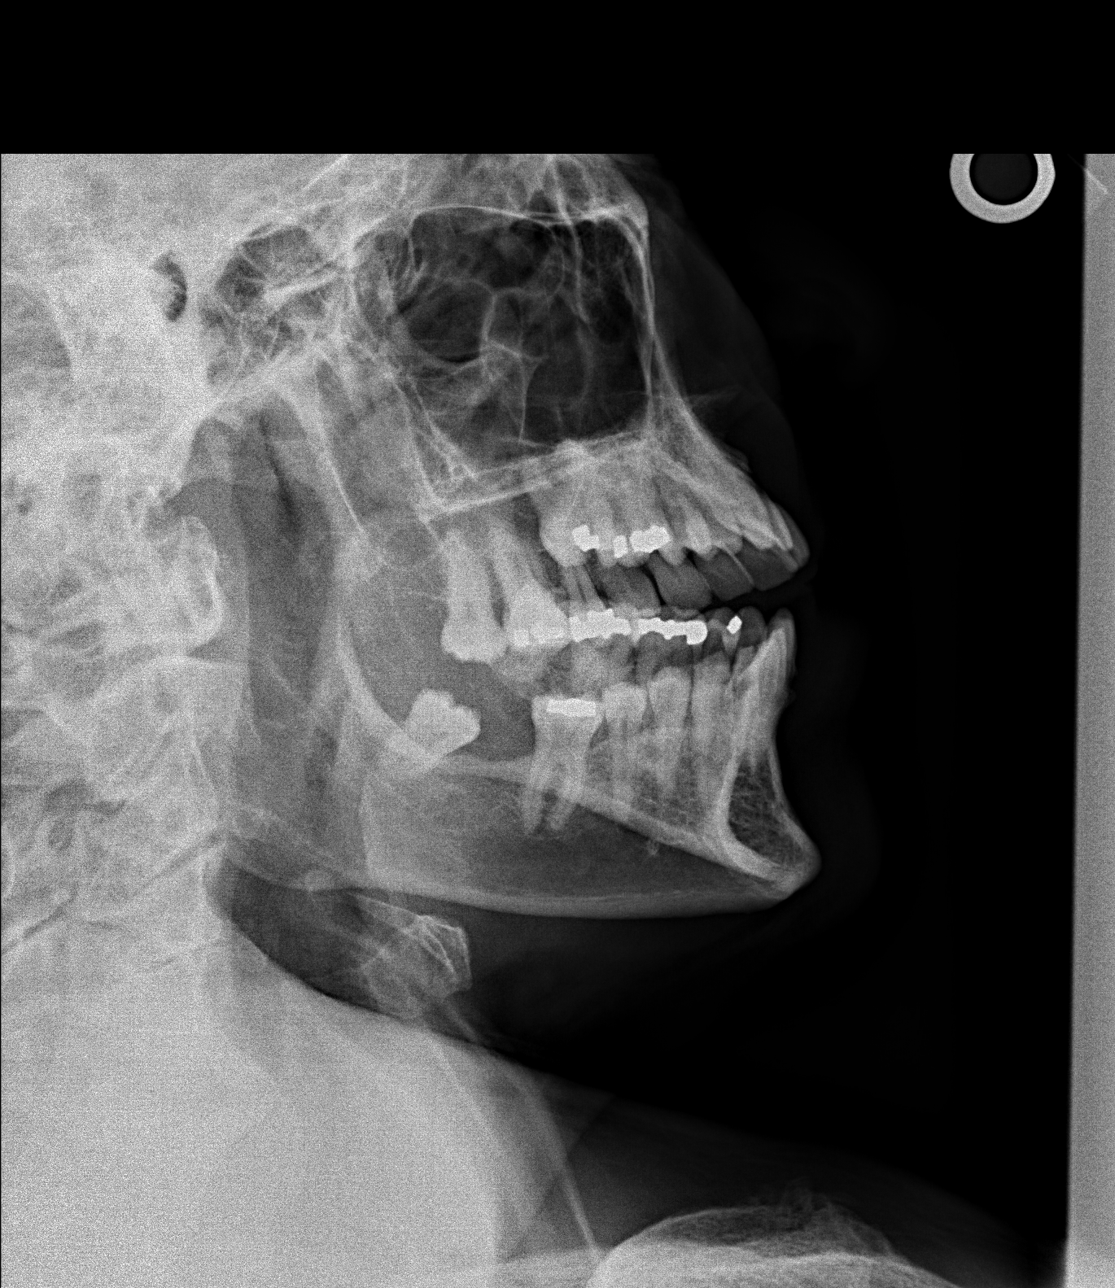
[im 4/4]
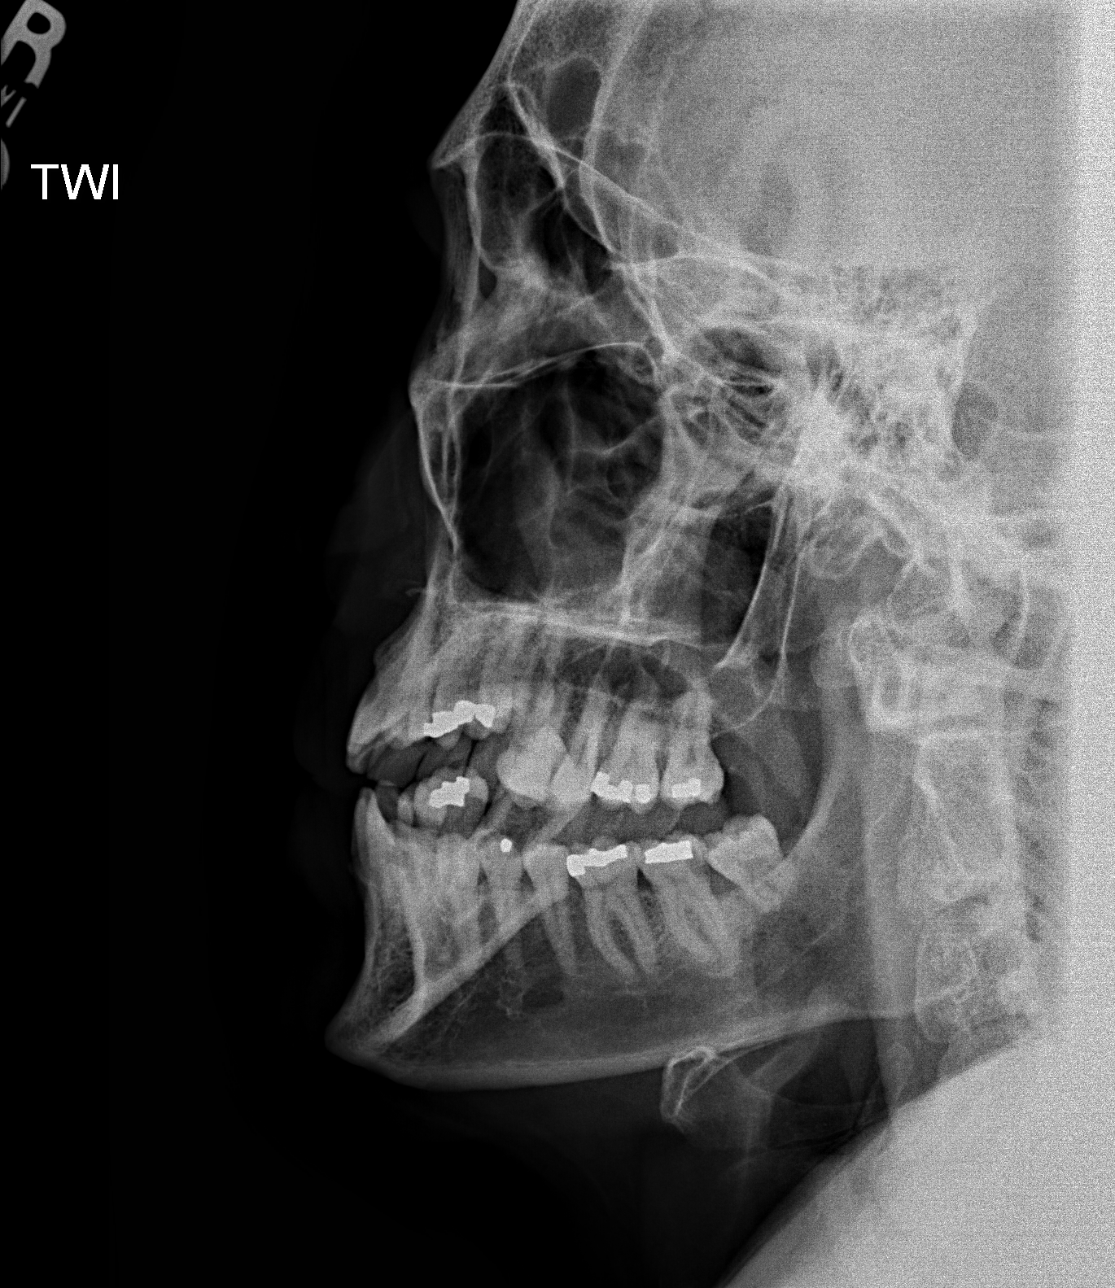

[4 of 4 positions shown; findings below may reference images not displayed]

IMPRESSION: No acute osseous injury of the mandible. If there is further clinical
concern regarding the upper mandibular joint further evaluation with a CT or
MRI of the temporomandibular joint is recommended.

## 2012-10-13 MED ORDER — DICLOFENAC SODIUM 75 MG PO TBEC: 75.0000 mg | DELAYED_RELEASE_TABLET | Freq: Two times a day (BID) | ORAL | Status: DC

## 2012-10-13 MED ORDER — TIZANIDINE HCL 4 MG PO CAPS: 4.0000 mg | ORAL_CAPSULE | Freq: Three times a day (TID) | ORAL | Status: DC | PRN

## 2012-10-13 NOTE — Patient Instructions (Addendum)
I an prescribing an anti inflammatory (voltaren one tablt twice daily) ,  A muscle relaxer (zanaflex , up to 3 times daily if tolerated)  And x rays of your jaw to evaluated your jaw pain.  Please read the handouts for some helpful tips to resolve your condition without further intervention.  We may end up referring you to an oral surgeon

## 2012-10-13 NOTE — Assessment & Plan Note (Signed)
Plain films of jaw ordered.  Electrolytes and TSH pening.  Trial fo voltarn and zanaflex. If no improvement refer to oral surgery

## 2012-10-13 NOTE — Assessment & Plan Note (Addendum)
Well controlled on current regimen of losartan and amlodipine.  Renal function due, no changes today.

## 2012-10-13 NOTE — Assessment & Plan Note (Signed)
LDL and triglycerides are at goal on current medications. He has no side effects and liver enzymes are normal. No changes today  

## 2012-10-13 NOTE — Progress Notes (Signed)
Patient ID: Jonathan Simon, male   DOB: 11/23/1960, 52 y.o.   MRN: [id number]  Patient Active Problem List   Diagnosis Date Noted  . TMJ (temporomandibular joint disorder) 10/13/2012  . Overweight (BMI 25.0-29.9) 10/13/2012  . Routine general medical examination at a health care facility 12/07/2011  . Hypertension 12/07/2011  . Hyperlipidemia LDL goal < 100 12/07/2011  . Screening for colon cancer 12/07/2011  . Spontaneous pneumothorax   . Treadmill stress test negative for angina pectoris     Subjective:  CC:   Chief Complaint  Patient presents with  . Follow-up    Refill on medications    HPI:   Jonathan Phillipsis a 52 y.o. male who presents Follow up on chronic  conditions including hypertension, hyperlipidemia and BMI of 30.  He was last seen  seen at annual exam October 2013 at which time He had no new complaints   New issue:   3 month history jaw pain on the left.  Started one night when he developed leg cramps in bed,  woke up the next morning with left side jaw clenched,  Has been unable to open left side fully since then despite using ibuprofren  400 mg every 6 to 8 hours . No history of trauma or infection .  Up to date on dental exam.   History of BMI > 30:  Has lost 12 lbs intentionally with diet and exercise.  Doing  cardio at least 4 days a week.     Past Medical History  Diagnosis Date  . Essential hypertension, benign   . Acute upper respiratory infections of unspecified site   . Acute maxillary sinusitis   . External hemorrhoids without mention of complication   . Esophageal reflux   . Other malaise and fatigue   . Other and unspecified hyperlipidemia   . Edema   . Spontaneous pneumothorax     bilateral s/p surgery/pleurodesis [facility]   . Treadmill stress test negative for angina pectoris 2006    [facility]    Past Surgical History  Procedure Laterality Date  . [place] pleurodesis      of bilateral pneumothrax  . Inguinal hernia repair      bilateral  .  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 Hemorrhoid surgery         The following portions of the patient's history were reviewed and updated as appropriate: Allergies, current medications, and problem list.    Review of Systems:   12 Pt  review of systems was negative except those addressed in the HPI,     History   Social History  . Marital Status: Married    Spouse Name: N/A    Number of Children: N/A  . Years of Education: N/A   Occupational History  . Not on file.   Social History Main Topics  . Smoking status: Never Smoker   . Smokeless tobacco: Never Used  . Alcohol Use: 4.2 oz/week    7 Cans of beer per week     Comment: occasional  . Drug Use: No  . Sexual Activity: Yes   Other Topics Concern  . Not on file   Social History Narrative  . No narrative on file    Objective:  Filed Vitals:   10/13/12 0806  BP: 124/80  Pulse: 52  Temp: 98.2 F (36.8 C)  Resp: 14     General appearance: alert, cooperative and appears stated age Ears: normal TM's  and external ear canals both ears Throat: lips, mucosa, and tongue normal; teeth and gums normal Neck: no adenopathy, no carotid bruit, supple, symmetrical, trachea midline and thyroid not enlarged, symmetric, no tenderness/mass/nodules Mouth: tender over left TMGJ area, no popping or masses  Back: symmetric, no curvature. ROM normal. No CVA tenderness. Lungs: clear to auscultation bilaterally Heart: regular rate and rhythm, S1, S2 normal, no murmur, click, rub or gallop Abdomen: soft, non-tender; bowel sounds normal; no masses,  no organomegaly Pulses: 2+ and symmetric Skin: Skin color, texture, turgor normal. No rashes or lesions Lymph nodes: Cervical, supraclavicular, and axillary nodes normal.  Assessment and Plan:  Hypertension Well controlled on current regimen of losartan and amlodipine.  Renal function due, no changes today.  Hyperlipidemia LDL goal < 100 LDL  and triglycerides are at goal on current medications. He has no side effects and liver enzymes are normal. No changes today   TMJ (temporomandibular joint disorder) Plain films of jaw ordered.  Electrolytes and TSH pening.  Trial fo voltarn and zanaflex. If no improvement refer to oral surgery   Overweight (BMI 25.0-29.9) Patient's BMI  is not an accurate assessment of his appropriate weight and metabolic state given his muscular build. Nevertheless he has dropped 12 lbs since last visit.  Diet reviewed .  He is exercising regularly.    Updated Medication List Outpatient Encounter Prescriptions as of 10/13/2012  Medication Sig Dispense Refill  . albuterol (PROVENTIL HFA;VENTOLIN HFA) 108 (90 BASE) MCG/ACT inhaler Inhale 2 puffs into the lungs every 6 (six) hours as needed for wheezing.  1 Inhaler  0  . amLODipine (NORVASC) 10 MG tablet TAKE ONE (1) TABLET EACH DAY  30 tablet  11  . fluticasone (VERAMYST) 27.5 MCG/SPRAY nasal spray Place 2 sprays into the nose daily.        Marland Kitchen losartan (COZAAR) 50 MG tablet TAKE 1 TABLET BY MOUTH EVERY DAY.  30 tablet  0  . niacin 500 MG tablet Take 500 mg by mouth daily with breakfast.        . pseudoephedrine (SUDAFED) 30 MG tablet Take 30 mg by mouth every 6 (six) hours as needed.        . simvastatin (ZOCOR) 20 MG tablet TAKE 1/2 TABLET BY MOUTH EVERY DAY  45 tablet  3  . diclofenac (VOLTAREN) 75 MG EC tablet Take 1 tablet (75 mg total) by mouth 2 (two) times daily.  60 tablet  2  . tiZANidine (ZANAFLEX) 4 MG capsule Take 1 capsule (4 mg total) by mouth 3 (three) times daily as needed for muscle spasms (or jaw pain).  60 capsule  1  . [DISCONTINUED] dexlansoprazole (DEXILANT) 60 MG capsule Take 1 capsule (60 mg total) by mouth daily.  30 capsule  5  . [DISCONTINUED] lisinopril (PRINIVIL,ZESTRIL) 40 MG tablet Take 40 mg by mouth daily.         No facility-administered encounter medications on file as of 10/13/2012.     Orders Placed This Encounter   Procedures  . DG Mandible 4 Views  . Comprehensive metabolic panel  . Lipid panel  . TSH  . Magnesium  . CK    No Follow-up on file.

## 2012-10-13 NOTE — Assessment & Plan Note (Signed)
Patient's BMI  is not an accurate assessment of his appropriate weight and metabolic state given his muscular build. Nevertheless he has dropped 12 lbs since last visit.  Diet reviewed .  He is exercising regularly.

## 2012-10-16 ENCOUNTER — Encounter: Payer: Self-pay | Admitting: *Deleted

## 2012-10-16 ENCOUNTER — Telehealth: Payer: Self-pay | Admitting: Internal Medicine

## 2012-10-16 NOTE — Telephone Encounter (Signed)
X rays of jaw are normal.  If his jaw pain persists after medicaiton treatment x 2 weeks,  Let me know

## 2012-10-16 NOTE — Telephone Encounter (Signed)
Patient notified and voiced understanding.

## 2012-10-25 ENCOUNTER — Encounter: Payer: Self-pay | Admitting: Internal Medicine

## 2012-11-03 ENCOUNTER — Other Ambulatory Visit: Payer: Self-pay | Admitting: Internal Medicine

## 2012-11-03 NOTE — Telephone Encounter (Signed)
Eprescribed.

## 2012-11-17 ENCOUNTER — Other Ambulatory Visit: Payer: Self-pay | Admitting: Internal Medicine

## 2012-11-17 NOTE — Telephone Encounter (Signed)
Eprescribed.

## 2013-01-06 ENCOUNTER — Other Ambulatory Visit: Payer: Self-pay | Admitting: Internal Medicine

## 2013-02-02 ENCOUNTER — Telehealth: Payer: Self-pay | Admitting: Internal Medicine

## 2013-02-02 NOTE — Telephone Encounter (Signed)
Left message for pt. Notified refill was completed 01/06/13 to [facility]. Requested call back with further questions of concerns

## 2013-02-02 NOTE — Telephone Encounter (Signed)
Pt left vm.  States he was here in August for refills and states he is already having a problem with his amlodipine refill.  Please contact.

## 2013-05-07 ENCOUNTER — Other Ambulatory Visit: Payer: Self-pay | Admitting: Internal Medicine

## 2013-05-21 ENCOUNTER — Other Ambulatory Visit: Payer: Self-pay | Admitting: Internal Medicine

## 2013-07-06 ENCOUNTER — Telehealth: Payer: Self-pay | Admitting: *Deleted

## 2013-07-06 MED ORDER — AMLODIPINE BESYLATE 10 MG PO TABS: ORAL_TABLET | ORAL | Status: DC

## 2013-07-06 NOTE — Telephone Encounter (Signed)
Refill sent per protocol, for amlodipine 10 mg

## 2013-10-16 ENCOUNTER — Other Ambulatory Visit: Payer: Self-pay | Admitting: Internal Medicine

## 2013-11-08 ENCOUNTER — Encounter: Payer: Self-pay | Admitting: Internal Medicine

## 2013-11-08 ENCOUNTER — Telehealth: Payer: Self-pay | Admitting: *Deleted

## 2013-11-08 ENCOUNTER — Ambulatory Visit (INDEPENDENT_AMBULATORY_CARE_PROVIDER_SITE_OTHER): Payer: Managed Care, Other (non HMO) | Admitting: Internal Medicine

## 2013-11-08 VITALS — BP 110/76 | HR 56 | Temp 98.4°F | Resp 14 | Ht 70.0 in | Wt 199.2 lb

## 2013-11-08 DIAGNOSIS — N529 Male erectile dysfunction, unspecified: Secondary | ICD-10-CM

## 2013-11-08 DIAGNOSIS — Z1211 Encounter for screening for malignant neoplasm of colon: Secondary | ICD-10-CM

## 2013-11-08 DIAGNOSIS — Z125 Encounter for screening for malignant neoplasm of prostate: Secondary | ICD-10-CM

## 2013-11-08 DIAGNOSIS — E663 Overweight: Secondary | ICD-10-CM

## 2013-11-08 DIAGNOSIS — Z0000 Encounter for general adult medical examination without abnormal findings: Secondary | ICD-10-CM

## 2013-11-08 DIAGNOSIS — Z79899 Other long term (current) drug therapy: Secondary | ICD-10-CM

## 2013-11-08 DIAGNOSIS — N400 Enlarged prostate without lower urinary tract symptoms: Secondary | ICD-10-CM | POA: Insufficient documentation

## 2013-11-08 DIAGNOSIS — R5381 Other malaise: Secondary | ICD-10-CM | POA: Insufficient documentation

## 2013-11-08 DIAGNOSIS — R5383 Other fatigue: Secondary | ICD-10-CM

## 2013-11-08 DIAGNOSIS — E785 Hyperlipidemia, unspecified: Secondary | ICD-10-CM | POA: Insufficient documentation

## 2013-11-08 DIAGNOSIS — I10 Essential (primary) hypertension: Secondary | ICD-10-CM

## 2013-11-08 LAB — CBC WITH DIFFERENTIAL/PLATELET
Basophils Absolute: 0 10*3/uL (ref 0.0–0.1)
Basophils Relative: 0.6 % (ref 0.0–3.0)
EOS PCT: 2.7 % (ref 0.0–5.0)
Eosinophils Absolute: 0.1 10*3/uL (ref 0.0–0.7)
HCT: 45.9 % (ref 39.0–52.0)
Hemoglobin: 15.4 g/dL (ref 13.0–17.0)
Lymphocytes Relative: 30 % (ref 12.0–46.0)
Lymphs Abs: 1.6 10*3/uL (ref 0.7–4.0)
MCHC: 33.6 g/dL (ref 30.0–36.0)
MCV: 90 fl (ref 78.0–100.0)
MONO ABS: 0.5 10*3/uL (ref 0.1–1.0)
MONOS PCT: 10.1 % (ref 3.0–12.0)
NEUTROS PCT: 56.6 % (ref 43.0–77.0)
Neutro Abs: 3.1 10*3/uL (ref 1.4–7.7)
Platelets: 204 10*3/uL (ref 150.0–400.0)
RBC: 5.1 Mil/uL (ref 4.22–5.81)
RDW: 13 % (ref 11.5–15.5)
WBC: 5.5 10*3/uL (ref 4.0–10.5)

## 2013-11-08 LAB — LIPID PANEL
CHOLESTEROL: 160 mg/dL (ref 0–200)
HDL: 47.7 mg/dL (ref >39.00)
LDL Cholesterol: 96 mg/dL (ref 0–99)
NonHDL: 112.3
Total CHOL/HDL Ratio: 3
Triglycerides: 82 mg/dL (ref 0.0–149.0)
VLDL: 16.4 mg/dL (ref 0.0–40.0)

## 2013-11-08 LAB — PSA: PSA: 0.64 ng/mL (ref 0.10–4.00)

## 2013-11-08 LAB — TSH: TSH: 1.42 u[IU]/mL (ref 0.35–4.50)

## 2013-11-08 MED ORDER — TADALAFIL 2.5 MG PO TABS: 1.0000 | ORAL_TABLET | Freq: Every day | ORAL | Status: DC

## 2013-11-08 NOTE — Telephone Encounter (Signed)
States Cialis is not covered under his insurance. Requesting another Rx. Spoke to pt, requested he call his insurance company to see what brand they will cover. Verbalized understanding.

## 2013-11-08 NOTE — Patient Instructions (Addendum)
You had your annual  wellness exam today  Please use the stool kit as your annual colon CA screening test.    We will contact you with the bloodwork results  I sent an rx for cialis to your pharmacy ,  If it is affordable, you make take it daily for BPH/ED.  If it is not,  We can try Flomax.  Benign Prostatic Hypertrophy The prostate gland is part of the reproductive system of men. A normal prostate is about the size and shape of a walnut. The prostate gland produces a fluid that is mixed with sperm to make semen. This gland surrounds the urethra and is located in front of the rectum and just below the bladder. The bladder is where urine is stored. The urethra is the tube through which urine passes from the bladder to get out of the body. The prostate grows as a man ages. An enlarged prostate not caused by cancer is called benign prostatic hypertrophy (BPH). An enlarged prostate can press on the urethra. This can make it harder to pass urine. In the early stages of enlargement, the bladder can get by with a narrowed urethra by forcing the urine through. If the problem gets worse, medical or surgical treatment may be required.  This condition should be followed by your health care provider. The accumulation of urine in the bladder can cause infection. Back pressure and infection can progress to bladder damage and kidney (renal) failure. If needed, your health care provider may refer you to a specialist in kidney and prostate disease (urologist). CAUSES  BPH is a common health problem in men older than 50 years. This condition is a normal part of aging. However, not all men will develop problems from this condition. If the enlargement grows away from the urethra, then there will not be any compression of the urethra and resistance to urine flow.If the growth is toward the urethra and compresses it, you will experience difficulty urinating.  SYMPTOMS   Not able to completely empty your  bladder.  Getting up often during the night to urinate.  Need to urinate frequently during the day.  Difficultly starting urine flow.  Decrease in size and strength of your urine stream.  Dribbling after urination.  Pain on urination (more common with infection).  Inability to pass urine. This needs immediate treatment.  The development of a urinary tract infection. DIAGNOSIS  These tests will help your health care provider understand your problem:  A thorough history and physical examination.  A urination history, with the number of times you urinate, the amounts of urine, the strength of the urine stream, and the feeling of emptiness or fullness after urinating.  A postvoid bladder scan that measures any amount of urine that may remain in your bladder after you finish urinating.  Digital rectal exam. In a rectal exam, your health care provider checks your prostate by putting a gloved, lubricated finger into your rectum to feel the back of your prostate gland. This exam detects the size of your gland and abnormal lumps or growths.  Exam of your urine (urinalysis).  Prostate specific antigen (PSA) screening. This is a blood test used to screen for prostate cancer.  Rectal ultrasonography. This test uses sound waves to electronically produce a picture of your prostate gland. TREATMENT  Once symptoms begin, your health care provider will monitor your condition. Of the men with this condition, one third will have symptoms that stabilize, one third will have symptoms that improve,  and one third will have symptoms that progress in the first year. Mild symptoms may not need treatment. Simple observation and yearly exams may be all that is required. Medicines and surgery are options for more severe problems. Your health care provider can help you make an informed decision for what is best. Two classes of medicines are available for relief of prostate symptoms:  Medicines that shrink  the prostate. This helps relieve symptoms. These medicines take time to work, and it may be months before any improvement is seen.  Uncommon side effects include problems with sexual function.  Medicines to relax the muscle of the prostate. This also relieves the obstruction by reducing any compression on the urethra.This group of medicines work much faster than those that reduce the size of the prostate gland. Usually, one can experience improvement in days to weeks..  Side effects can include dizziness, fatigue, lightheadedness, and retrograde ejaculation (diminished volume of ejaculate). Several types of surgical treatments are available for relief of prostate symptoms:  Transurethral resection of the prostate (TURP)--In this treatment, an instrument is inserted through opening at the tip of the penis. It is used to cut away pieces of the inner core of the prostate. The pieces are removed through the same opening of the penis. This removes the obstruction and helps get rid of the symptoms.  Transurethral incision (TUIP)--In this procedure, small cuts are made in the prostate. This lessens the prostates pressure on the urethra.  Transurethral microwave thermotherapy (TUMT)--This procedure uses microwaves to create heat. The heat destroys and removes a small amount of prostate tissue.  Transurethral needle ablation (TUNA)--This is a procedure that uses radio frequencies to do the same as TUMT.  Interstitial laser coagulation (ILC)--This is a procedure that uses a laser to do the same as TUMT and TUNA.  Transurethral electrovaporization (TUVP)--This is a procedure that uses electrodes to do the same as the procedures listed above. SEEK MEDICAL CARE IF:   You develop a fever.  There is unexplained back pain.  Symptoms are not helped by medicines prescribed.  You develop side effects from the medicine you are taking.  Your urine becomes very dark or has a bad smell.  Your lower abdomen  becomes distended and you have difficulty passing your urine. SEEK IMMEDIATE MEDICAL CARE IF:   You are suddenly unable to urinate. This is an emergency. You should be seen immediately.  There are large amounts of blood or clots in the urine.  Your urinary problems become unmanageable.  You develop lightheadedness, severe dizziness, or you feel faint.  You develop moderate to severe low back or flank pain.  You develop chills or fever. Document Released: 02/15/2005 Document Revised: 02/20/2013 Document Reviewed: 08/31/2012 [facility] Patient Information 2015 [place], [place]. This information is not intended to replace advice given to you by your health care provider. Make sure you discuss any questions you have with your health care provider.

## 2013-11-08 NOTE — Progress Notes (Signed)
Pre visit review using our clinic review tool, if applicable. No additional management support is needed unless otherwise documented below in the visit note. 

## 2013-11-10 ENCOUNTER — Encounter: Payer: Self-pay | Admitting: Internal Medicine

## 2013-11-10 DIAGNOSIS — Z1211 Encounter for screening for malignant neoplasm of colon: Secondary | ICD-10-CM | POA: Insufficient documentation

## 2013-11-10 NOTE — Assessment & Plan Note (Signed)
Annual male exam was done including testicular and prostate exam, which were normal.   Lab Results  Component Value Date   PSA 0.64 11/08/2013   PSA 0.83 12/16/2011

## 2013-11-10 NOTE — Assessment & Plan Note (Addendum)
He has been taking current statin for the past year without side effects> LDL is at goal  but is overdue for liver enzymes.  No changes.   Lab Results  Component Value Date   CHOL 160 11/08/2013   HDL 47.70 11/08/2013   LDLCALC 96 11/08/2013   TRIG 82.0 11/08/2013   CHOLHDL 3 11/08/2013

## 2013-11-10 NOTE — Progress Notes (Signed)
Patient ID: Jonathan Simon, male   DOB: 20-Sep-1960, 53 y.o.   MRN: [id number] The patient is here for his annual male physical examination and management of other chronic and acute problems, including hypertension, hyperlipidemia, and overweight.   He was last seen a year ago,  And has lost 4 lbs since last visit  Through diet and regular exercsie. Marland Kitchen  He has no new issues.    The risk factors are reflected in the social history.  The roster of all physicians providing medical care to patient - is listed in the Snapshot section of the chart.  Activities of daily living:  The patient is 100% independent in all ADLs: dressing, toileting, feeding as well as independent mobility  Home safety : The patient has smoke detectors in the home. He wears seatbelts.  There are no firearms at home. There is no violence in the home.   There is no risks for hepatitis, STDs or HIV. There is no   history of blood transfusion. There is no travel history to infectious disease endemic areas of the world.  The patient has seen their dentist in the last six month and  their eye doctor in the last year.  They do not  have excessive sun exposure. They have seen a dermatoloigist in the last year. Discussed the need for sun protection: hats, long sleeves and use of sunscreen if there is significant sun exposure.   Diet: the importance of a healthy diet is discussed. They do have a healthy diet.  The benefits of regular aerobic exercise were discussed. He exercises a minimum of 30 minutes  5 days per week. Depression screen: there are no signs or vegative symptoms of depression- irritability, change in appetite, anhedonia, sadness/tearfullness.  The following portions of the patient's history were reviewed and updated as appropriate: allergies, current medications, past family history, past medical history,  past surgical history, past social history  and problem list.  Visual acuity was not assessed per patient  preference since he has regular follow up with his ophthalmologist. Hearing and body mass index were assessed and reviewed.   During the course of the visit the patient was educated and counseled about appropriate screening and preventive services including :  nutrition counseling, colorectal cancer screening, and recommended immunizations.    Objective: BP 110/76  Pulse 56  Temp(Src) 98.4 F (36.9 C) (Oral)  Resp 14  Ht 5\' 10"  (1.778 m)  Wt 199 lb 4 oz (90.379 kg)  BMI 28.59 kg/m2  SpO2 99%  General Appearance:    Alert, cooperative, no distress, appears stated age  Head:    Normocephalic, without obvious abnormality, atraumatic  Eyes:    PERRL, conjunctiva/corneas clear, EOM's intact, fundi    benign, both eyes       Ears:    Normal TM's and external ear canals, both ears  Nose:   Nares normal, septum midline, mucosa normal, no drainage   or sinus tenderness  Throat:   Lips, mucosa, and tongue normal; teeth and gums normal  Neck:   Supple, symmetrical, trachea midline, no adenopathy;       thyroid:  No enlargement/tenderness/nodules; no carotid   bruit or JVD  Back:     Symmetric, no curvature, ROM normal, no CVA tenderness  Lungs:     Clear to auscultation bilaterally, respirations unlabored  Chest wall:    No tenderness or deformity  Heart:    Regular rate and rhythm, S1 and S2 normal, no murmur, rub  or gallop  Abdomen:     Soft, non-tender, bowel sounds active all four quadrants,    no masses, no organomegaly  Genitalia:    Normal male without lesion, discharge or tenderness  Rectal:    Normal tone, normal prostate, no masses or tenderness;   guaiac negative stool  Extremities:   Extremities normal, atraumatic, no cyanosis or edema  Pulses:   2+ and symmetric all extremities  Skin:   Skin color, texture, turgor normal, no rashes or lesions  Lymph nodes:   Cervical, supraclavicular, and axillary nodes normal  Neurologic:   CNII-XII intact. Normal strength, sensation and  reflexes      throughout   Assessment and Plan:  Hypertension Well controlled on current regimen. He is overdue for assessment of lytes and renal function  But will return for fasting labs.  No changes today   Lab Results  Component Value Date   CREATININE 1.1 10/13/2012   Lab Results  Component Value Date   NA 138 10/13/2012   K 4.6 10/13/2012   CL 103 10/13/2012   CO2 29 10/13/2012     BPH (benign prostatic hypertrophy) By history.  He has noted increased frequency and decreased stream over the last year without dysuria or hematuria.  He has also reported ED.Marland Kitchen  His exam is normal today.  Given both issues,  Trial of cialis daily.   Erectile dysfunction Trial of cialis given concurrent symptoms of BPH.   Hyperlipidemia with target LDL less than 100 He has been taking current statin for the past year without side effects> LDL is at goal  but is overdue for liver enzymes.  No changes.   Lab Results  Component Value Date   CHOL 160 11/08/2013   HDL 47.70 11/08/2013   LDLCALC 96 11/08/2013   TRIG 82.0 11/08/2013   CHOLHDL 3 11/08/2013       Overweight (BMI 25.0-29.9) I have address his gradual  Reduction in  BMI to < 29 and encouraged  Continued weight loss with goal of 10% of body weigh over the next 6 months using a low glycemic index diet and regular exercise a minimum of 5 days per week.    Screening for prostate cancer Annual male exam was done including testicular and prostate exam, which were normal.   Lab Results  Component Value Date   PSA 0.64 11/08/2013   PSA 0.83 12/16/2011     Encounter for preventive health examination Annual male exam was done including testicular exam and check for hernias.  Screening for STDS and depressive symptoms was negative. Vaccines were brought up to date.  Healthy habits were reviewed including use of seatbelts 100% of the time , moderate alcohol consumption, regular exercise, and the  mediterranean diet. Health maintenance handout  was given,    Updated Medication List Outpatient Encounter Prescriptions as of 11/08/2013  Medication Sig  . albuterol (PROVENTIL HFA;VENTOLIN HFA) 108 (90 BASE) MCG/ACT inhaler Inhale 2 puffs into the lungs every 6 (six) hours as needed for wheezing.  Marland Kitchen amLODipine (NORVASC) 10 MG tablet TAKE ONE (1) TABLET EACH DAY  . diclofenac (VOLTAREN) 75 MG EC tablet Take 1 tablet (75 mg total) by mouth 2 (two) times daily.  . fluticasone (VERAMYST) 27.5 MCG/SPRAY nasal spray Place 2 sprays into the nose daily.    Marland Kitchen losartan (COZAAR) 50 MG tablet TAKE 1 TABLET BY MOUTH EVERY DAY.  . niacin 500 MG tablet Take 500 mg by mouth daily with breakfast.    .  pseudoephedrine (SUDAFED) 30 MG tablet Take 30 mg by mouth every 6 (six) hours as needed.    . simvastatin (ZOCOR) 20 MG tablet TAKE ONE-HALF (1/2) TABLET EVERY DAY  . tiZANidine (ZANAFLEX) 4 MG capsule Take 1 capsule (4 mg total) by mouth 3 (three) times daily as needed for muscle spasms (or jaw pain).  . Tadalafil 2.5 MG TABS Take 1 tablet (2.5 mg total) by mouth daily.

## 2013-11-10 NOTE — Assessment & Plan Note (Signed)
I have address his gradual  Reduction in  BMI to < 29 and encouraged  Continued weight loss with goal of 10% of body weigh over the next 6 months using a low glycemic index diet and regular exercise a minimum of 5 days per week.

## 2013-11-10 NOTE — Assessment & Plan Note (Signed)
Well controlled on current regimen. He is overdue for assessment of lytes and renal function  But will return for fasting labs.  No changes today   Lab Results  Component Value Date   CREATININE 1.1 10/13/2012   Lab Results  Component Value Date   NA 138 10/13/2012   K 4.6 10/13/2012   CL 103 10/13/2012   CO2 29 10/13/2012

## 2013-11-10 NOTE — Assessment & Plan Note (Signed)
By history.  He has noted increased frequency and decreased stream over the last year without dysuria or hematuria.  He has also reported ED.Jonathan Simon  His exam is normal today.  Given both issues,  Trial of cialis daily.

## 2013-11-10 NOTE — Assessment & Plan Note (Signed)
Annual male exam was done including testicular exam and check for hernias.  Screening for STDS and depressive symptoms was negative. Vaccines were brought up to date.  Healthy habits were reviewed including use of seatbelts 100% of the time , moderate alcohol consumption, regular exercise, and the  mediterranean diet. Health maintenance handout was given,

## 2013-11-10 NOTE — Assessment & Plan Note (Signed)
Trial of cialis given concurrent symptoms of BPH.

## 2013-11-12 ENCOUNTER — Other Ambulatory Visit: Payer: Self-pay | Admitting: Internal Medicine

## 2013-11-14 ENCOUNTER — Other Ambulatory Visit (INDEPENDENT_AMBULATORY_CARE_PROVIDER_SITE_OTHER): Payer: Managed Care, Other (non HMO)

## 2013-11-14 DIAGNOSIS — Z79899 Other long term (current) drug therapy: Secondary | ICD-10-CM

## 2013-11-15 LAB — COMPREHENSIVE METABOLIC PANEL
ALT: 31 U/L (ref 0–53)
AST: 33 U/L (ref 0–37)
Albumin: 4.4 g/dL (ref 3.5–5.2)
Alkaline Phosphatase: 56 U/L (ref 39–117)
BUN: 13 mg/dL (ref 6–23)
CALCIUM: 9.4 mg/dL (ref 8.4–10.5)
CHLORIDE: 105 meq/L (ref 96–112)
CO2: 24 mEq/L (ref 19–32)
Creatinine, Ser: 1.1 mg/dL (ref 0.4–1.5)
GFR: 74.63 mL/min (ref >60.00)
GLUCOSE: 100 mg/dL — AB (ref 70–99)
Potassium: 4.2 mEq/L (ref 3.5–5.1)
Sodium: 138 mEq/L (ref 135–145)
Total Bilirubin: 1.6 mg/dL — ABNORMAL HIGH (ref 0.2–1.2)
Total Protein: 7.4 g/dL (ref 6.0–8.3)

## 2013-11-19 ENCOUNTER — Encounter: Payer: Self-pay | Admitting: *Deleted

## 2013-11-26 ENCOUNTER — Other Ambulatory Visit (INDEPENDENT_AMBULATORY_CARE_PROVIDER_SITE_OTHER): Payer: Managed Care, Other (non HMO)

## 2013-11-26 ENCOUNTER — Other Ambulatory Visit: Payer: Self-pay | Admitting: *Deleted

## 2013-11-26 DIAGNOSIS — Z1211 Encounter for screening for malignant neoplasm of colon: Secondary | ICD-10-CM

## 2013-11-26 LAB — FECAL OCCULT BLOOD, IMMUNOCHEMICAL: FECAL OCCULT BLD: NEGATIVE

## 2013-11-28 ENCOUNTER — Encounter: Payer: Self-pay | Admitting: *Deleted

## 2014-02-19 ENCOUNTER — Other Ambulatory Visit: Payer: Self-pay | Admitting: Internal Medicine

## 2014-05-17 ENCOUNTER — Other Ambulatory Visit: Payer: Self-pay | Admitting: Internal Medicine

## 2014-08-26 ENCOUNTER — Other Ambulatory Visit: Payer: Self-pay | Admitting: Internal Medicine

## 2014-08-26 NOTE — Telephone Encounter (Signed)
Last visit 11/08/13, no appt scheduled. Refill?

## 2014-08-27 NOTE — Telephone Encounter (Signed)
Ok to refill,  Refill sent.   needs appt

## 2014-10-21 ENCOUNTER — Ambulatory Visit: Payer: Managed Care, Other (non HMO) | Admitting: Internal Medicine

## 2014-10-23 ENCOUNTER — Ambulatory Visit
Admission: RE | Admit: 2014-10-23 | Discharge: 2014-10-23 | Disposition: A | Discharge status: Home / Self Care | Payer: Managed Care, Other (non HMO) | Source: Ambulatory Visit | Attending: Internal Medicine | Admitting: Internal Medicine

## 2014-10-23 ENCOUNTER — Encounter (INDEPENDENT_AMBULATORY_CARE_PROVIDER_SITE_OTHER): Payer: Self-pay

## 2014-10-23 ENCOUNTER — Ambulatory Visit (INDEPENDENT_AMBULATORY_CARE_PROVIDER_SITE_OTHER): Payer: Managed Care, Other (non HMO) | Admitting: Internal Medicine

## 2014-10-23 ENCOUNTER — Encounter: Payer: Self-pay | Admitting: Internal Medicine

## 2014-10-23 VITALS — BP 118/84 | HR 65 | Temp 98.4°F | Resp 14 | Ht 70.0 in | Wt 200.2 lb

## 2014-10-23 DIAGNOSIS — G8929 Other chronic pain: Secondary | ICD-10-CM | POA: Insufficient documentation

## 2014-10-23 DIAGNOSIS — E663 Overweight: Secondary | ICD-10-CM

## 2014-10-23 DIAGNOSIS — E785 Hyperlipidemia, unspecified: Secondary | ICD-10-CM | POA: Diagnosis not present

## 2014-10-23 DIAGNOSIS — R1031 Right lower quadrant pain: Secondary | ICD-10-CM

## 2014-10-23 DIAGNOSIS — I10 Essential (primary) hypertension: Secondary | ICD-10-CM

## 2014-10-23 DIAGNOSIS — K5900 Constipation, unspecified: Secondary | ICD-10-CM

## 2014-10-23 DIAGNOSIS — Z113 Encounter for screening for infections with a predominantly sexual mode of transmission: Secondary | ICD-10-CM

## 2014-10-23 LAB — URINALYSIS, ROUTINE W REFLEX MICROSCOPIC
BILIRUBIN URINE: NEGATIVE
HGB URINE DIPSTICK: NEGATIVE
KETONES UR: NEGATIVE
Leukocytes, UA: NEGATIVE
NITRITE: NEGATIVE
RBC / HPF: NONE SEEN (ref 0–?)
Specific Gravity, Urine: 1.02 (ref 1.000–1.030)
TOTAL PROTEIN, URINE-UPE24: NEGATIVE
URINE GLUCOSE: NEGATIVE
Urobilinogen, UA: 0.2 (ref 0.0–1.0)
pH: 6 (ref 5.0–8.0)

## 2014-10-23 LAB — COMPREHENSIVE METABOLIC PANEL
ALT: 25 U/L (ref 0–53)
AST: 25 U/L (ref 0–37)
Albumin: 4.5 g/dL (ref 3.5–5.2)
Alkaline Phosphatase: 58 U/L (ref 39–117)
BILIRUBIN TOTAL: 1.7 mg/dL — AB (ref 0.2–1.2)
BUN: 12 mg/dL (ref 6–23)
CALCIUM: 9.8 mg/dL (ref 8.4–10.5)
CHLORIDE: 103 meq/L (ref 96–112)
CO2: 25 meq/L (ref 19–32)
Creatinine, Ser: 0.85 mg/dL (ref 0.40–1.50)
GFR: 99.1 mL/min (ref >60.00)
GLUCOSE: 96 mg/dL (ref 70–99)
POTASSIUM: 4.1 meq/L (ref 3.5–5.1)
Sodium: 140 mEq/L (ref 135–145)
Total Protein: 7.5 g/dL (ref 6.0–8.3)

## 2014-10-23 LAB — LIPID PANEL
CHOLESTEROL: 156 mg/dL (ref 0–200)
HDL: 49.2 mg/dL (ref >39.00)
LDL Cholesterol: 81 mg/dL (ref 0–99)
NonHDL: 106.62
TRIGLYCERIDES: 129 mg/dL (ref 0.0–149.0)
Total CHOL/HDL Ratio: 3
VLDL: 25.8 mg/dL (ref 0.0–40.0)

## 2014-10-23 LAB — POCT URINALYSIS DIPSTICK
Bilirubin, UA: NEGATIVE
Blood, UA: NEGATIVE
GLUCOSE UA: NEGATIVE
Ketones, UA: NEGATIVE
LEUKOCYTES UA: NEGATIVE
NITRITE UA: NEGATIVE
PROTEIN UA: NEGATIVE
SPEC GRAV UA: 1.02
UROBILINOGEN UA: 0.2
pH, UA: 5.5

## 2014-10-23 LAB — LDL CHOLESTEROL, DIRECT: Direct LDL: 88 mg/dL

## 2014-10-23 IMAGING — CR DG ABDOMEN 2V
1 series · 3 of 3 positions shown · non-contrast
Comparison: CT scan of [DATE].

CLINICAL DATA: Right lower quadrant abdominal pain for 3 months.

EXAM:
ABDOMEN - 2 VIEW

[Series 1: dg abd 2 views · 0.14mm/px · 3 of 3 slices shown]
[im 1/3]
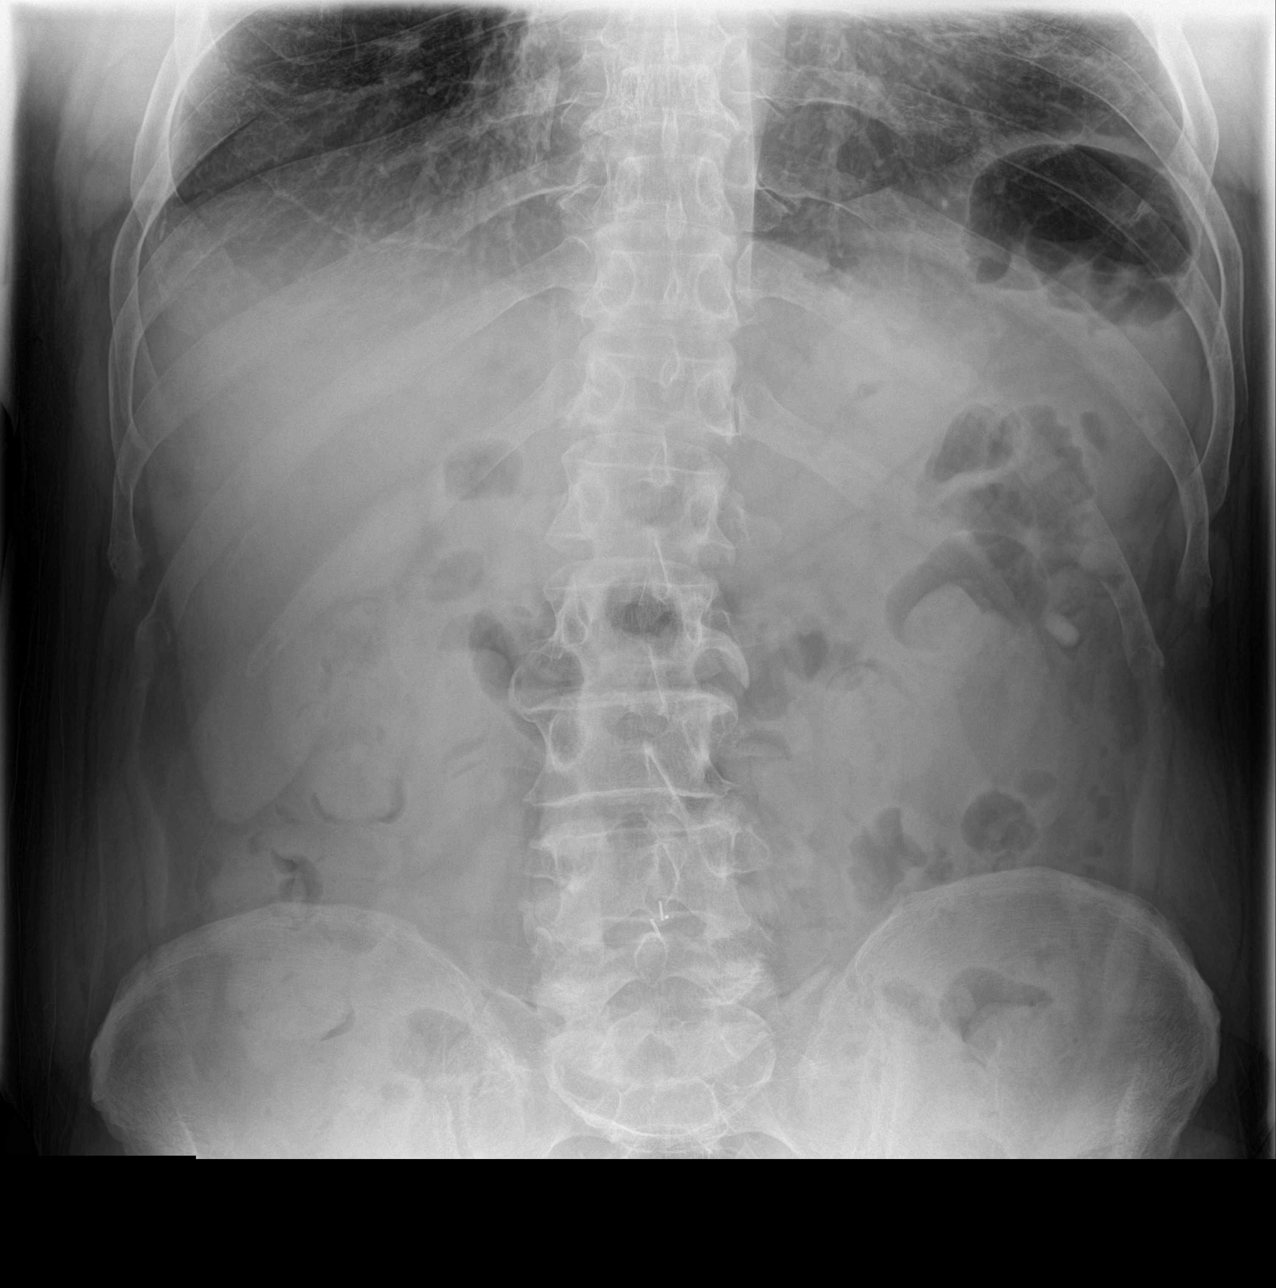
[im 2/3]
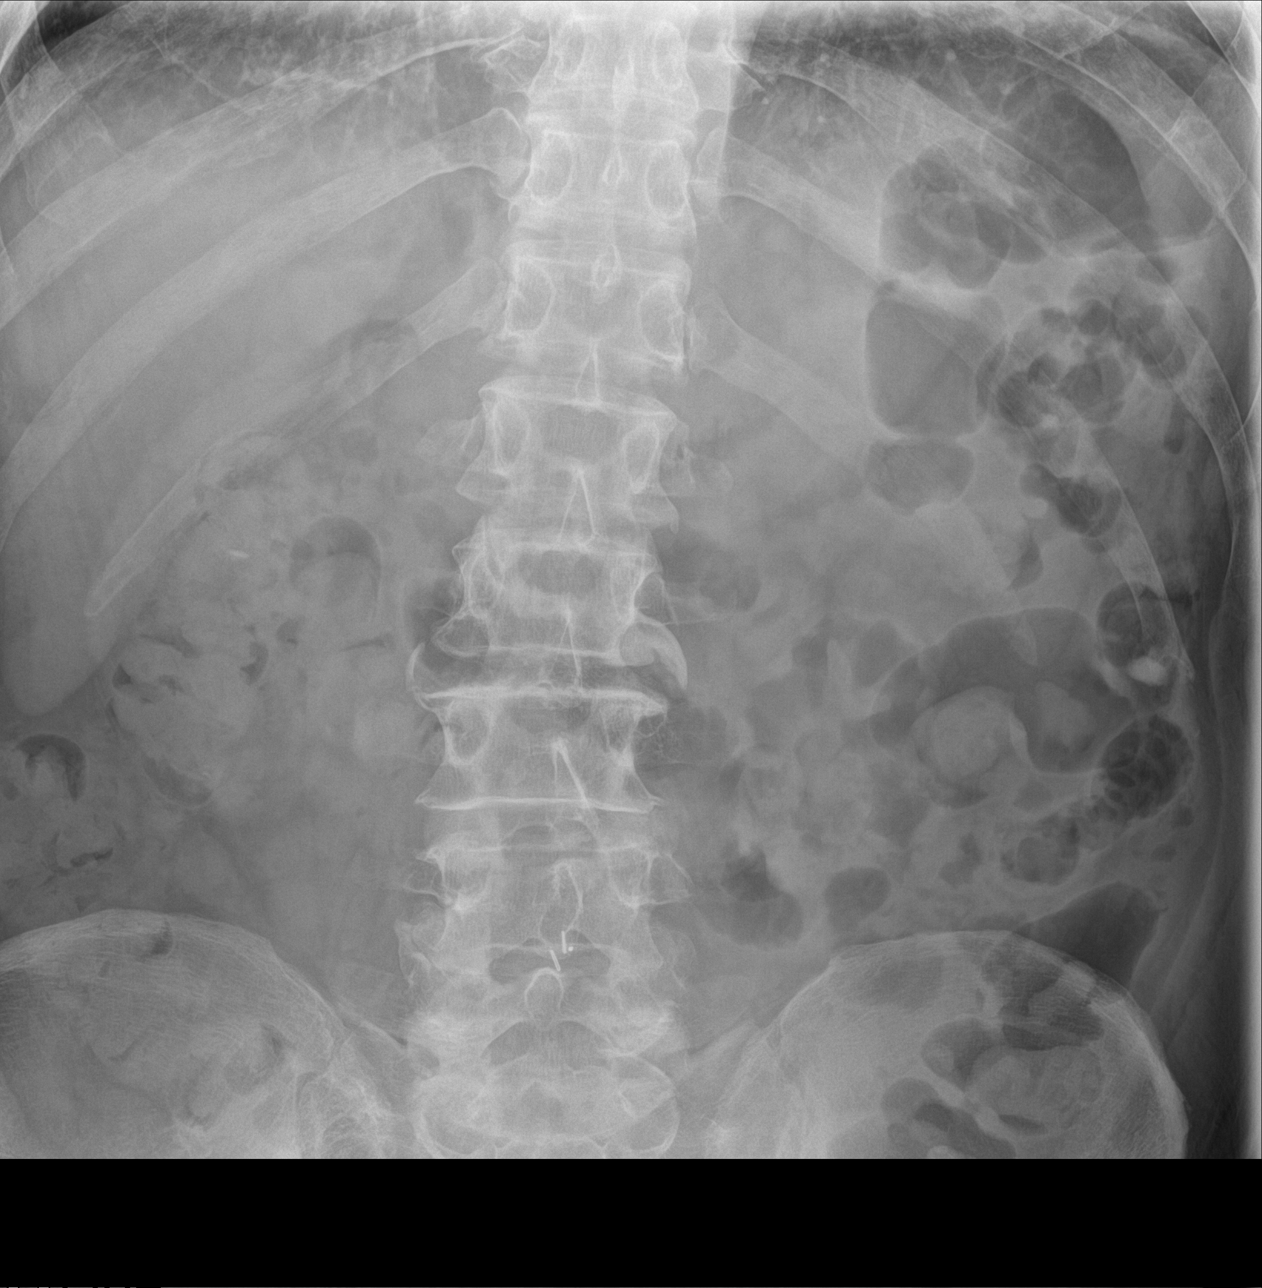
[im 3/3]
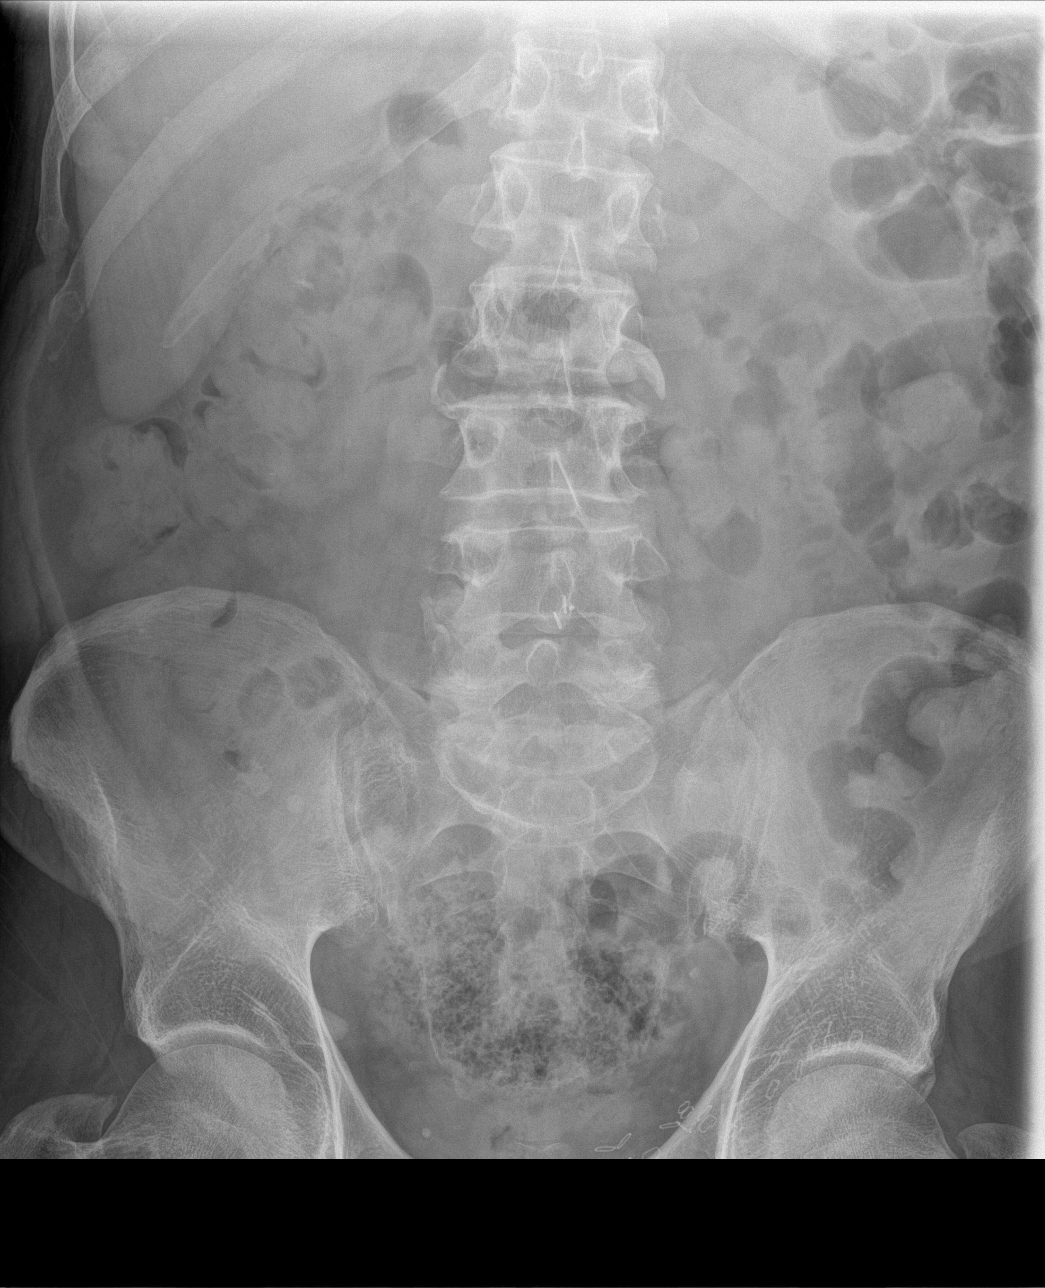

[3 of 3 positions shown; findings below may reference images not displayed]

FINDINGS: The bowel gas pattern is normal. There is no evidence of free air.
Surgical sutures and phleboliths are noted in the pelvis. Moderate
stool burden is noted.
IMPRESSION: No evidence of bowel obstruction or ileus.

## 2014-10-23 NOTE — Patient Instructions (Signed)
Please go to [facility] for abdominal films today  Your pain may be from chronic constipation , urinary retention,  Or from diverticulosis  Treatment will depend on the films and labs done today

## 2014-10-23 NOTE — Progress Notes (Signed)
Subjective:  Patient ID: Jonathan Simon, male    DOB: Nov 24, 1960  Age: 54 y.o. MRN: [id number]  CC: The primary encounter diagnosis was Hyperlipidemia with target LDL less than 100. Diagnoses of Essential hypertension, Abdominal pain, chronic, right lower quadrant, Constipation, unspecified constipation type, Screen for STD (sexually transmitted disease), and Overweight (BMI 25.0-29.9) were also pertinent to this visit.  HPI Jonathan Simon presents for evaluation and management of a 3 month history of bilateral lower flank pain, and bloating .   The pain is constant,  occasionally worse on one side or the other . The pain is occasionally sharp in the groin, and he reports suprapubic pain which iis severe and transient lasting < 5 seconds.  No history of kidney stones.  No hematuria .  Stools have become irregular and hard, and his BMs feel incomplete.   Has had occasional post prandial nausea    Which lasts less than 15 minutes   History of  Bilateral inguinal hernia surgery  Last one was 2003 on the right side    2) follow up on chronic problems including hypertension, hyperlipidemia and overweight .  He was last seen Sept 12 2015.     Outpatient Prescriptions Prior to Visit  Medication Sig Dispense Refill  . albuterol (PROVENTIL HFA;VENTOLIN HFA) 108 (90 BASE) MCG/ACT inhaler Inhale 2 puffs into the lungs every 6 (six) hours as needed for wheezing. 1 Inhaler 0  . amLODipine (NORVASC) 10 MG tablet TAKE ONE (1) TABLET EACH DAY 30 tablet 1  . diclofenac (VOLTAREN) 75 MG EC tablet Take 1 tablet (75 mg total) by mouth 2 (two) times daily. 60 tablet 2  . fluticasone (VERAMYST) 27.5 MCG/SPRAY nasal spray Place 2 sprays into the nose daily.      Marland Kitchen losartan (COZAAR) 50 MG tablet TAKE 1 TABLET BY MOUTH EVERY DAY. 30 tablet 0  . niacin 500 MG tablet Take 500 mg by mouth daily with breakfast.      . pseudoephedrine (SUDAFED) 30 MG tablet Take 30 mg by mouth every 6 (six) hours as needed.      .  simvastatin (ZOCOR) 20 MG tablet TAKE ONE-HALF (1/2) TABLET EVERY DAY 45 tablet 5  . Tadalafil 2.5 MG TABS Take 1 tablet (2.5 mg total) by mouth daily. 60 each 2  . tiZANidine (ZANAFLEX) 4 MG capsule Take 1 capsule (4 mg total) by mouth 3 (three) times daily as needed for muscle spasms (or jaw pain). 60 capsule 1   No facility-administered medications prior to visit.    Review of Systems;  Patient denies headache, fevers, malaise, unintentional weight loss, skin rash, eye pain, sinus congestion and sinus pain, sore throat, dysphagia,  hemoptysis , cough, dyspnea, wheezing, chest pain, palpitations, orthopnea, edema, abdominal pain, nausea, melena, diarrhea, constipation, flank pain, dysuria, hematuria, urinary  Frequency, nocturia, numbness, tingling, seizures,  Focal weakness, Loss of consciousness,  Tremor, insomnia, depression, anxiety, and suicidal ideation.      Objective:  BP 118/84 mmHg  Pulse 65  Temp(Src) 98.4 F (36.9 C) (Oral)  Resp 14  Ht 5\' 10"  (1.778 m)  Wt 200 lb 4 oz (90.833 kg)  BMI 28.73 kg/m2  SpO2 97%  BP Readings from Last 3 Encounters:  10/23/14 118/84  11/08/13 110/76  10/13/12 124/80    Wt Readings from Last 3 Encounters:  10/23/14 200 lb 4 oz (90.833 kg)  11/08/13 199 lb 4 oz (90.379 kg)  10/13/12 203 lb 8 oz (92.307 kg)  General appearance: alert, cooperative and appears stated age Ears: normal TM's and external ear canals both ears Throat: lips, mucosa, and tongue normal; teeth and gums normal Neck: no adenopathy, no carotid bruit, supple, symmetrical, trachea midline and thyroid not enlarged, symmetric, no tenderness/mass/nodules Back: symmetric, no curvature. ROM normal. No CVA tenderness. Lungs: clear to auscultation bilaterally Heart: regular rate and rhythm, S1, S2 normal, no murmur, click, rub or gallop Abdomen: soft, tender to palpation in all quadrants  Without guarding, bowel sounds normal; no masses,  no organomegaly. No inguinal  hernias.   Pulses: 2+ and symmetric Skin: Skin color, texture, turgor normal. No rashes or lesions Lymph nodes: Cervical, supraclavicular, and axillary nodes normal.  No results found for: HGBA1C  Lab Results  Component Value Date   CREATININE 0.85 10/23/2014   CREATININE 1.1 11/14/2013   CREATININE 1.1 10/13/2012    Lab Results  Component Value Date   WBC 5.5 11/08/2013   HGB 15.4 11/08/2013   HCT 45.9 11/08/2013   PLT 204.0 11/08/2013   GLUCOSE 96 10/23/2014   CHOL 156 10/23/2014   TRIG 129.0 10/23/2014   HDL 49.20 10/23/2014   LDLDIRECT 88.0 10/23/2014   LDLCALC 81 10/23/2014   ALT 25 10/23/2014   AST 25 10/23/2014   NA 140 10/23/2014   K 4.1 10/23/2014   CL 103 10/23/2014   CREATININE 0.85 10/23/2014   BUN 12 10/23/2014   CO2 25 10/23/2014   TSH 1.42 11/08/2013   PSA 0.64 11/08/2013    No results found.  Assessment & Plan:   Problem List Items Addressed This Visit      Unprioritized   Hypertension    Well controlled on current regimen. Renal function stable, no changes today.  Lab Results  Component Value Date   CREATININE 0.85 10/23/2014   Lab Results  Component Value Date   NA 140 10/23/2014   K 4.1 10/23/2014   CL 103 10/23/2014   CO2 25 10/23/2014         Relevant Orders   Comprehensive metabolic panel (Completed)   DG Abd 2 Views (Completed)   Hyperlipidemia with target LDL less than 100 - Primary    LDL and triglycerides are at goal on current medications. He has no side effects and liver enzymes are normal. No changes today  Lab Results  Component Value Date   CHOL 156 10/23/2014   HDL 49.20 10/23/2014   LDLCALC 81 10/23/2014   LDLDIRECT 88.0 10/23/2014   TRIG 129.0 10/23/2014   CHOLHDL 3 10/23/2014   Lab Results  Component Value Date   ALT 25 10/23/2014   AST 25 10/23/2014   ALKPHOS 58 10/23/2014   BILITOT 1.7* 10/23/2014           Relevant Orders   LDL cholesterol, direct (Completed)   Lipid panel (Completed)    Overweight (BMI 25.0-29.9)    I have addressed  BMI and recommended wt loss of 10% of body weigh over the next 6 months using a low glycemic index diet and regular exercise a minimum of 5 days per week.   Body mass index is 28.73 kg/(m^2).       Abdominal pain, chronic, right lower quadrant    History , exam and plain films of abdomen done today suggest constipation as the cause,  Not recurrent inguinal hernia .  Will implement a bowel cleansing regimen with GoLytely.  If no improvement ,  CT abd and pelvis may  Be indicated.  Relevant Orders   Urinalysis, Routine w reflex microscopic   DG Abd 2 Views (Completed)   POCT urinalysis dipstick (Completed)    Other Visit Diagnoses    Constipation, unspecified constipation type        Relevant Orders    Comprehensive metabolic panel (Completed)    Screen for STD (sexually transmitted disease)        Relevant Orders    HIV antibody (Completed)    Hepatitis C antibody (Completed)       I am having Mr. Juday maintain his pseudoephedrine, fluticasone, niacin, albuterol, tiZANidine, diclofenac, Tadalafil, simvastatin, losartan, and amLODipine.  No orders of the defined types were placed in this encounter.    There are no discontinued medications.  Follow-up: No Follow-up on file.   Crecencio Mc, MD

## 2014-10-23 NOTE — Progress Notes (Signed)
Pre-visit discussion using our clinic review tool. No additional management support is needed unless otherwise documented below in the visit note.  

## 2014-10-24 ENCOUNTER — Telehealth: Payer: Self-pay | Admitting: *Deleted

## 2014-10-24 LAB — HIV ANTIBODY (ROUTINE TESTING W REFLEX): HIV 1&2 Ab, 4th Generation: NONREACTIVE

## 2014-10-24 LAB — HEPATITIS C ANTIBODY: HCV Ab: NEGATIVE

## 2014-10-24 MED ORDER — PEG 3350-KCL-NA BICARB-NACL 420 G PO SOLR: ORAL | Status: DC

## 2014-10-24 NOTE — Telephone Encounter (Signed)
Pt aware Rx sent.  

## 2014-10-24 NOTE — Telephone Encounter (Signed)
Pt called states the Rx for Go Lightly was not sent to the pharmacy.  Please advise

## 2014-10-24 NOTE — Telephone Encounter (Signed)
Was waiting for the x rays and lab results.   Xrays show moderate constipation. Labs normal.  Go lytely sent to local pharmacy,  Drink an 8 ounce glass every 30 minutes until constipation is relieved .  Should take about 4 or 5 glasses.

## 2014-10-25 NOTE — Assessment & Plan Note (Signed)
LDL and triglycerides are at goal on current medications. He has no side effects and liver enzymes are normal. No changes today  Lab Results  Component Value Date   CHOL 156 10/23/2014   HDL 49.20 10/23/2014   LDLCALC 81 10/23/2014   LDLDIRECT 88.0 10/23/2014   TRIG 129.0 10/23/2014   CHOLHDL 3 10/23/2014   Lab Results  Component Value Date   ALT 25 10/23/2014   AST 25 10/23/2014   ALKPHOS 58 10/23/2014   BILITOT 1.7* 10/23/2014

## 2014-10-25 NOTE — Assessment & Plan Note (Signed)
I have addressed  BMI and recommended wt loss of 10% of body weigh over the next 6 months using a low glycemic index diet and regular exercise a minimum of 5 days per week.   Body mass index is 28.73 kg/(m^2).

## 2014-10-25 NOTE — Assessment & Plan Note (Signed)
History , exam and plain films of abdomen done today suggest constipation as the cause,  Not recurrent inguinal hernia .  Will implement a bowel cleansing regimen with GoLytely.  If no improvement ,  CT abd and pelvis may  Be indicated.

## 2014-10-25 NOTE — Assessment & Plan Note (Signed)
Well controlled on current regimen. Renal function stable, no changes today.  Lab Results  Component Value Date   CREATININE 0.85 10/23/2014   Lab Results  Component Value Date   NA 140 10/23/2014   K 4.1 10/23/2014   CL 103 10/23/2014   CO2 25 10/23/2014

## 2014-11-29 ENCOUNTER — Other Ambulatory Visit: Payer: Self-pay

## 2014-11-29 MED ORDER — SIMVASTATIN 20 MG PO TABS: 20.0000 mg | ORAL_TABLET | Freq: Every day | ORAL | Status: DC

## 2014-12-06 ENCOUNTER — Other Ambulatory Visit: Payer: Self-pay

## 2014-12-06 MED ORDER — AMLODIPINE BESYLATE 10 MG PO TABS: 10.0000 mg | ORAL_TABLET | Freq: Every day | ORAL | Status: DC

## 2014-12-26 ENCOUNTER — Other Ambulatory Visit: Payer: Self-pay

## 2014-12-26 MED ORDER — LOSARTAN POTASSIUM 50 MG PO TABS: 50.0000 mg | ORAL_TABLET | Freq: Every day | ORAL | Status: DC

## 2015-01-31 ENCOUNTER — Encounter: Payer: Self-pay | Admitting: Emergency Medicine

## 2015-01-31 ENCOUNTER — Emergency Department
Admission: EM | Admit: 2015-01-31 | Discharge: 2015-01-31 | Disposition: A | Discharge status: Home / Self Care | Payer: Managed Care, Other (non HMO) | Attending: Emergency Medicine | Admitting: Emergency Medicine

## 2015-01-31 ENCOUNTER — Encounter: Payer: Self-pay | Admitting: Internal Medicine

## 2015-01-31 DIAGNOSIS — Z791 Long term (current) use of non-steroidal anti-inflammatories (NSAID): Secondary | ICD-10-CM | POA: Diagnosis not present

## 2015-01-31 DIAGNOSIS — R55 Syncope and collapse: Secondary | ICD-10-CM | POA: Insufficient documentation

## 2015-01-31 DIAGNOSIS — I10 Essential (primary) hypertension: Secondary | ICD-10-CM | POA: Diagnosis not present

## 2015-01-31 DIAGNOSIS — Z79899 Other long term (current) drug therapy: Secondary | ICD-10-CM | POA: Insufficient documentation

## 2015-01-31 LAB — CBC
HEMATOCRIT: 47 % (ref 40.0–52.0)
Hemoglobin: 15.6 g/dL (ref 13.0–18.0)
MCH: 29.9 pg (ref 26.0–34.0)
MCHC: 33.2 g/dL (ref 32.0–36.0)
MCV: 89.8 fL (ref 80.0–100.0)
PLATELETS: 209 10*3/uL (ref 150–440)
RBC: 5.23 MIL/uL (ref 4.40–5.90)
RDW: 13.3 % (ref 11.5–14.5)
WBC: 8.8 10*3/uL (ref 3.8–10.6)

## 2015-01-31 LAB — TROPONIN I: Troponin I: <0.03 ng/mL (ref <0.031)

## 2015-01-31 LAB — BASIC METABOLIC PANEL
Anion gap: 4 — ABNORMAL LOW (ref 5–15)
BUN: 12 mg/dL (ref 6–20)
CHLORIDE: 107 mmol/L (ref 101–111)
CO2: 28 mmol/L (ref 22–32)
CREATININE: 0.92 mg/dL (ref 0.61–1.24)
Calcium: 9.2 mg/dL (ref 8.9–10.3)
GFR calc Af Amer: >60 mL/min (ref >60)
GFR calc non Af Amer: >60 mL/min (ref >60)
GLUCOSE: 92 mg/dL (ref 65–99)
POTASSIUM: 3.9 mmol/L (ref 3.5–5.1)
SODIUM: 139 mmol/L (ref 135–145)

## 2015-01-31 NOTE — Discharge Instructions (Signed)
Please seek medical attention for any high fevers, chest pain, shortness of breath, change in behavior, persistent vomiting, bloody stool or any other new or concerning symptoms. ° °Syncope, commonly known as fainting, is a temporary loss of consciousness. It occurs when the blood flow to the brain is reduced. Vasovagal syncope (also called neurocardiogenic syncope) is a fainting spell in which the blood flow to the brain is reduced because of a sudden drop in heart rate and blood pressure. Vasovagal syncope occurs when the brain and the cardiovascular system (blood vessels) do not adequately communicate and respond to each other. This is the most common cause of fainting. It often occurs in response to fear or some other type of emotional or physical stress. The body has a reaction in which the heart starts beating too slowly or the blood vessels expand, reducing blood pressure. This type of fainting spell is generally considered harmless. However, injuries can occur if a person takes a sudden fall during a fainting spell.  °CAUSES  °Vasovagal syncope occurs when a person's blood pressure and heart rate decrease suddenly, usually in response to a trigger. Many things and situations can trigger an episode. Some of these include:  °· Pain.   °· Fear.   °· The sight of blood or medical procedures, such as blood being drawn from a vein.   °· Common activities, such as coughing, swallowing, stretching, or going to the bathroom.   °· Emotional stress.   °· Prolonged standing, especially in a warm environment.   °· Lack of sleep or rest.   °· Prolonged lack of food.   °· Prolonged lack of fluids.   °· Recent illness. °· The use of certain drugs that affect blood pressure, such as cocaine, alcohol, marijuana, inhalants, and opiates.   °SYMPTOMS  °Before the fainting episode, you may:  °· Feel dizzy or light headed.   °· Become pale. °· Sense that you are going to faint.   °· Feel like the room is spinning.   °· Have tunnel  vision, only seeing directly in front of you.   °· Feel sick to your stomach (nauseous).   °· See spots or slowly lose vision.   °· Hear ringing in your ears.   °· Have a headache.   °· Feel warm and sweaty.   °· Feel a sensation of pins and needles. °During the fainting spell, you will generally be unconscious for no longer than a couple minutes before waking up and returning to normal. If you get up too quickly before your body can recover, you may faint again. Some twitching or jerky movements may occur during the fainting spell.  °DIAGNOSIS  °Your health care provider will ask about your symptoms, take a medical history, and perform a physical exam. Various tests may be done to rule out other causes of fainting. These may include blood tests and tests to check the heart, such as electrocardiography, echocardiography, and possibly an electrophysiology study. When other causes have been ruled out, a test may be done to check the body's response to changes in position (tilt table test). °TREATMENT  °Most cases of vasovagal syncope do not require treatment. Your health care provider may recommend ways to avoid fainting triggers and may provide home strategies for preventing fainting. If you must be exposed to a possible trigger, you can drink additional fluids to help reduce your chances of having an episode of vasovagal syncope. If you have warning signs of an oncoming episode, you can respond by positioning yourself favorably (lying down). °If your fainting spells continue, you may be given medicines to prevent   fainting. Some medicines may help make you more resistant to repeated episodes of vasovagal syncope. Special exercises or compression stockings may be recommended. In rare cases, the surgical placement of a pacemaker is considered. °HOME CARE INSTRUCTIONS  °· Learn to identify the warning signs of vasovagal syncope.   °· Sit or lie down at the first warning sign of a fainting spell. If sitting, put your  head down between your legs. If you lie down, swing your legs up in the air to increase blood flow to the brain.   °· Avoid hot tubs and saunas. °· Avoid prolonged standing. °· Drink enough fluids to keep your urine clear or pale yellow. Avoid caffeine. °· Increase salt in your diet as directed by your health care provider.   °· If you have to stand for a long time, perform movements such as:   °¨ Crossing your legs.   °¨ Flexing and stretching your leg muscles.   °¨ Squatting.   °¨ Moving your legs.   °¨ Bending over.   °· Only take over-the-counter or prescription medicines as directed by your health care provider. Do not suddenly stop any medicines without asking your health care provider first.  °SEEK MEDICAL CARE IF:  °· Your fainting spells continue or happen more frequently in spite of treatment.   °· You lose consciousness for more than a couple minutes. °· You have fainting spells during or after exercising or after being startled.   °· You have new symptoms that occur with the fainting spells, such as:   °¨ Shortness of breath. °¨ Chest pain.   °¨ Irregular heartbeat.   °· You have episodes of twitching or jerky movements that last longer than a few seconds. °· You have episodes of twitching or jerky movements without obvious fainting. °SEEK IMMEDIATE MEDICAL CARE IF:  °· You have injuries or bleeding after a fainting spell.   °· You have episodes of twitching or jerky movements that last longer than 5 minutes.   °· You have more than one spell of twitching or jerky movements before returning to consciousness after fainting. °  °This information is not intended to replace advice given to you by your health care provider. Make sure you discuss any questions you have with your health care provider. °  °Document Released: 02/02/2012 Document Revised: 07/02/2014 Document Reviewed: 02/02/2012 °Elsevier Interactive Patient Education ©2016 [employer] ° °

## 2015-01-31 NOTE — ED Notes (Addendum)
Had a syncopal episode at the dentist this am they staff noticed that his blood pressure dropped  pale

## 2015-01-31 NOTE — ED Provider Notes (Signed)
[facility] Emergency Department Provider Note   ____________________________________________  Time seen: 1415  I have reviewed the triage vital signs and the nursing notes.   HISTORY  Chief Complaint Near Syncope   History limited by: Not Limited   HPI Jonathan Simon is a 54 y.o. male who presents to the emergency department today after a syncopal episode. Patient states that he was at his dentist office. He started feeling hot. He then stood up and started walking down the hallway when he collapsed. He was only out for a couple of seconds. He denied any chest pain at that time. He was in a fair amount of pain secondary to a tooth issue. At the dentist office they checked his blood pressure were they got a systolic in the [id number] and a pulse rate in the 40s. The patient did then get his tooth pulled by the dentist. He states that he now feels much better. He has never had a syncopal episode in the past. He has not had any recent fevers chest pain or shortness breath. Denies any recent palpitations.   Past Medical History  Diagnosis Date  . Essential hypertension, benign   . Acute upper respiratory infections of unspecified site   . Acute maxillary sinusitis   . External hemorrhoids without mention of complication   . Esophageal reflux   . Other malaise and fatigue   . Other and unspecified hyperlipidemia   . Edema   . Spontaneous pneumothorax     bilateral s/p surgery/pleurodesis [facility]   . Treadmill stress test negative for angina pectoris 2006    [facility]    Patient Active Problem List   Diagnosis Date Noted  . Abdominal pain, chronic, right lower quadrant 10/23/2014  . Screening for prostate cancer 11/10/2013  . Other malaise and fatigue 11/08/2013  . BPH (benign prostatic hypertrophy) 11/08/2013  . Erectile dysfunction 11/08/2013  . TMJ (temporomandibular joint disorder) 10/13/2012  . Overweight (BMI 25.0-29.9) 10/13/2012  . Encounter for preventive  health examination 12/07/2011  . Hypertension 12/07/2011  . Hyperlipidemia with target LDL less than 100 12/07/2011  . Screening for colon cancer 12/07/2011  . Treadmill stress test negative for angina pectoris     Past Surgical History  Procedure Laterality Date  . [place] pleurodesis      of bilateral pneumothrax  . Inguinal hernia repair      bilateral  . 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 Hemorrhoid surgery      Current Outpatient Rx  Name  Route  Sig  Dispense  Refill  . albuterol (PROVENTIL HFA;VENTOLIN HFA) 108 (90 BASE) MCG/ACT inhaler   Inhalation   Inhale 2 puffs into the lungs every 6 (six) hours as needed for wheezing.   1 Inhaler   0   . amLODipine (NORVASC) 10 MG tablet   Oral   Take 1 tablet (10 mg total) by mouth daily.   30 tablet   5     PATIENT NEEDS APPT   . diclofenac (VOLTAREN) 75 MG EC tablet   Oral   Take 1 tablet (75 mg total) by mouth 2 (two) times daily.   60 tablet   2   . fluticasone (VERAMYST) 27.5 MCG/SPRAY nasal spray   Nasal   Place 2 sprays into the nose daily.           Marland Kitchen losartan (COZAAR) 50 MG tablet   Oral   Take 1 tablet (50 mg  total) by mouth daily.   30 tablet   3   . niacin 500 MG tablet   Oral   Take 500 mg by mouth daily with breakfast.           . polyethylene glycol-electrolytes (NULYTELY/GOLYTELY) 420 G solution      Drink an 8 ounce glass every 30 minutes  Until constipation is relieved   4000 mL   0   . pseudoephedrine (SUDAFED) 30 MG tablet   Oral   Take 30 mg by mouth every 6 (six) hours as needed.           . simvastatin (ZOCOR) 20 MG tablet   Oral   Take 1 tablet (20 mg total) by mouth daily at 6 PM.   45 tablet   5   . Tadalafil 2.5 MG TABS   Oral   Take 1 tablet (2.5 mg total) by mouth daily.   60 each   2   . tiZANidine (ZANAFLEX) 4 MG capsule   Oral   Take 1 capsule (4 mg total) by mouth 3 (three) times daily as needed for muscle spasms (or jaw  pain).   60 capsule   1     Allergies Review of patient's allergies indicates no known allergies.  Family History  Problem Relation Age of Onset  . Heart disease Father   . Heart disease Paternal Grandfather     Social History Social History  Substance Use Topics  . Smoking status: Never Smoker   . Smokeless tobacco: Never Used  . Alcohol Use: 4.2 oz/week    7 Cans of beer per week     Comment: occasional    Review of Systems  Constitutional: Negative for fever. Cardiovascular: Negative for chest pain. Respiratory: Negative for shortness of breath. Gastrointestinal: Negative for abdominal pain, vomiting and diarrhea. Genitourinary: Negative for dysuria. Musculoskeletal: Negative for back pain. Skin: Negative for rash. Neurological: Negative for headaches, focal weakness or numbness.  10-point ROS otherwise negative.  ____________________________________________   PHYSICAL EXAM:  VITAL SIGNS: ED Triage Vitals  Enc Vitals Group     BP 01/31/15 1220 136/80 mmHg     Pulse Rate 01/31/15 1220 58     Resp 01/31/15 1220 20     Temp 01/31/15 1220 97.6 F (36.4 C)     Temp Source 01/31/15 1220 Oral     SpO2 01/31/15 1220 98 %     Weight 01/31/15 1220 198 lb (89.812 kg)     Height 01/31/15 1220 5\' 11"  (1.803 m)   Constitutional: Alert and oriented. Well appearing and in no distress. Eyes: Conjunctivae are normal. PERRL. Normal extraocular movements. ENT   Head: Normocephalic and atraumatic.   Nose: No congestion/rhinnorhea.   Mouth/Throat: Mucous membranes are moist.   Neck: No stridor. Hematological/Lymphatic/Immunilogical: No cervical lymphadenopathy. Cardiovascular: Normal rate, regular rhythm.  No murmurs, rubs, or gallops. Respiratory: Normal respiratory effort without tachypnea nor retractions. Breath sounds are clear and equal bilaterally. No wheezes/rales/rhonchi. Gastrointestinal: Soft and nontender. No distention.  Genitourinary:  Deferred Musculoskeletal: Normal range of motion in all extremities. No joint effusions.  No lower extremity tenderness nor edema. Neurologic:  Normal speech and language. No gross focal neurologic deficits are appreciated.  Skin:  Skin is warm, dry and intact. No rash noted. Psychiatric: Mood and affect are normal. Speech and behavior are normal. Patient exhibits appropriate insight and judgment.  ____________________________________________    LABS (pertinent positives/negatives)  Labs Reviewed  BASIC METABOLIC PANEL - Abnormal; Notable for  the following:    Anion gap 4 (*)    All other components within normal limits  CBC  TROPONIN I     ____________________________________________   EKG  I, Nance Pear, attending physician, personally viewed and interpreted this EKG  EKG Time: 1202 Rate: 53 Rhythm: sinus bradycardia Axis: normal Intervals: qtc 427 QRS: narrow ST changes: no st elevation, t wave inversion V1-V3 Impression: abnormal ekg   ____________________________________________    RADIOLOGY  None   ____________________________________________   PROCEDURES  Procedure(s) performed: None  Critical Care performed: No  ____________________________________________   INITIAL IMPRESSION / ASSESSMENT AND PLAN / ED COURSE  Pertinent labs & imaging results that were available during my care of the patient were reviewed by me and considered in my medical decision making (see chart for details).  Patient presented to the emergency department today after a syncopal episode at the dentist office. It does sound like the patient suffered a vasovagal cause of syncope. This point EKG and blood work without any concerning findings except for some T-wave inversion. This point I think patient is safe for discharge home. Discussed return precautions. ____________________________________________   FINAL CLINICAL IMPRESSION(S) / ED DIAGNOSES  Final diagnoses:   Syncope and collapse     Nance Pear, MD 01/31/15 1427

## 2015-01-31 NOTE — Progress Notes (Signed)
Agree with plan. Reviewed with nurse. Oxygen sat was 97% on RA.  Would recommend follow up with Dr. Derrel Nip if any recurrent symptoms.

## 2015-01-31 NOTE — Progress Notes (Unsigned)
Patient walked into clinic c/o hypotension, patient stated he was seen at dentist for tooth extraction this am and had syncope episode in waiting room before extraction. BP reported by family member from dentist office was 77/45 with pulse of 41. Patient was alert oriented to person, place, and time.  Checked vitals and patient vitals were 128/82 BP, pulse 72 02 sat @ 97. Patient advised nurse he has been taking Vicodin cough medication , doxycycline and nasal spray for sinus infection prescribed by Acute Care. Patient was advised to return to acute care clinic closing and no MD available, and patient agreed. Patient left with family member.

## 2015-05-21 ENCOUNTER — Other Ambulatory Visit: Payer: Self-pay

## 2015-05-21 MED ORDER — LOSARTAN POTASSIUM 50 MG PO TABS: 50.0000 mg | ORAL_TABLET | Freq: Every day | ORAL | Status: DC

## 2015-06-20 ENCOUNTER — Other Ambulatory Visit: Payer: Self-pay | Admitting: Surgical

## 2015-06-20 MED ORDER — AMLODIPINE BESYLATE 10 MG PO TABS: 10.0000 mg | ORAL_TABLET | Freq: Every day | ORAL | Status: DC

## 2015-08-05 ENCOUNTER — Other Ambulatory Visit: Payer: Self-pay

## 2015-08-05 MED ORDER — AMLODIPINE BESYLATE 10 MG PO TABS: 10.0000 mg | ORAL_TABLET | Freq: Every day | ORAL | Status: DC

## 2015-08-05 NOTE — Telephone Encounter (Signed)
Pt instructed at last refill to have OV for further refills. Gave 15 tablets until pt can schedule OV.

## 2015-08-22 ENCOUNTER — Other Ambulatory Visit: Payer: Self-pay | Admitting: *Deleted

## 2015-08-22 MED ORDER — AMLODIPINE BESYLATE 10 MG PO TABS: 10.0000 mg | ORAL_TABLET | Freq: Every day | ORAL | Status: DC

## 2015-08-22 NOTE — Telephone Encounter (Signed)
Spoke with wife & asked her to have patient call the office to schedule an appt soon.

## 2015-09-23 ENCOUNTER — Other Ambulatory Visit: Payer: Self-pay

## 2015-09-23 MED ORDER — AMLODIPINE BESYLATE 10 MG PO TABS: 10.0000 mg | ORAL_TABLET | Freq: Every day | ORAL | 3 refills | Status: DC

## 2015-09-23 NOTE — Telephone Encounter (Signed)
Has appointment 10/02/2015. Renaldo Fiddler, CMA

## 2015-10-02 ENCOUNTER — Ambulatory Visit (INDEPENDENT_AMBULATORY_CARE_PROVIDER_SITE_OTHER): Payer: Managed Care, Other (non HMO) | Admitting: Internal Medicine

## 2015-10-02 ENCOUNTER — Encounter: Payer: Self-pay | Admitting: Internal Medicine

## 2015-10-02 VITALS — BP 110/78 | HR 56 | Temp 97.9°F | Ht 69.5 in | Wt 195.5 lb

## 2015-10-02 DIAGNOSIS — E785 Hyperlipidemia, unspecified: Secondary | ICD-10-CM

## 2015-10-02 DIAGNOSIS — I10 Essential (primary) hypertension: Secondary | ICD-10-CM

## 2015-10-02 DIAGNOSIS — Z1211 Encounter for screening for malignant neoplasm of colon: Secondary | ICD-10-CM | POA: Diagnosis not present

## 2015-10-02 DIAGNOSIS — R5383 Other fatigue: Secondary | ICD-10-CM | POA: Diagnosis not present

## 2015-10-02 DIAGNOSIS — Z0000 Encounter for general adult medical examination without abnormal findings: Secondary | ICD-10-CM | POA: Diagnosis not present

## 2015-10-02 DIAGNOSIS — Z125 Encounter for screening for malignant neoplasm of prostate: Secondary | ICD-10-CM

## 2015-10-02 DIAGNOSIS — R0781 Pleurodynia: Secondary | ICD-10-CM

## 2015-10-02 LAB — COMPREHENSIVE METABOLIC PANEL
ALT: 17 U/L (ref 0–53)
AST: 19 U/L (ref 0–37)
Albumin: 4.5 g/dL (ref 3.5–5.2)
Alkaline Phosphatase: 76 U/L (ref 39–117)
BUN: 14 mg/dL (ref 6–23)
CHLORIDE: 105 meq/L (ref 96–112)
CO2: 28 meq/L (ref 19–32)
Calcium: 9.7 mg/dL (ref 8.4–10.5)
Creatinine, Ser: 0.88 mg/dL (ref 0.40–1.50)
GFR: 94.89 mL/min (ref >60.00)
GLUCOSE: 88 mg/dL (ref 70–99)
POTASSIUM: 4.4 meq/L (ref 3.5–5.1)
SODIUM: 139 meq/L (ref 135–145)
TOTAL PROTEIN: 7.1 g/dL (ref 6.0–8.3)
Total Bilirubin: 1.8 mg/dL — ABNORMAL HIGH (ref 0.2–1.2)

## 2015-10-02 LAB — CBC WITH DIFFERENTIAL/PLATELET
BASOS ABS: 0 10*3/uL (ref 0.0–0.1)
BASOS PCT: 0.7 % (ref 0.0–3.0)
Eosinophils Absolute: 0.1 10*3/uL (ref 0.0–0.7)
Eosinophils Relative: 1.9 % (ref 0.0–5.0)
HEMATOCRIT: 44.4 % (ref 39.0–52.0)
Hemoglobin: 15.1 g/dL (ref 13.0–17.0)
LYMPHS ABS: 1.5 10*3/uL (ref 0.7–4.0)
Lymphocytes Relative: 31.8 % (ref 12.0–46.0)
MCHC: 34 g/dL (ref 30.0–36.0)
MCV: 89.3 fl (ref 78.0–100.0)
MONOS PCT: 12.8 % — AB (ref 3.0–12.0)
Monocytes Absolute: 0.6 10*3/uL (ref 0.1–1.0)
NEUTROS ABS: 2.5 10*3/uL (ref 1.4–7.7)
NEUTROS PCT: 52.8 % (ref 43.0–77.0)
PLATELETS: 216 10*3/uL (ref 150.0–400.0)
RBC: 4.97 Mil/uL (ref 4.22–5.81)
RDW: 13.3 % (ref 11.5–15.5)
WBC: 4.8 10*3/uL (ref 4.0–10.5)

## 2015-10-02 LAB — LIPID PANEL
CHOLESTEROL: 144 mg/dL (ref 0–200)
HDL: 51.1 mg/dL (ref >39.00)
LDL CALC: 78 mg/dL (ref 0–99)
NonHDL: 92.67
Total CHOL/HDL Ratio: 3
Triglycerides: 71 mg/dL (ref 0.0–149.0)
VLDL: 14.2 mg/dL (ref 0.0–40.0)

## 2015-10-02 LAB — PSA: PSA: 0.65 ng/mL (ref 0.10–4.00)

## 2015-10-02 LAB — TSH: TSH: 1.02 u[IU]/mL (ref 0.35–4.50)

## 2015-10-02 NOTE — Patient Instructions (Addendum)
[employer] pasta (great tasting,  Low carb)    You need an eye exam.    If YOUR LEFT SIDE IS  still tender   in a few weeks.  Call to set up x ray  [place] Maintenance, Male A healthy lifestyle and preventative care can promote health and wellness.  Maintain regular health, dental, and eye exams.  Eat a healthy diet. Foods like vegetables, fruits, whole grains, low-fat dairy products, and lean protein foods contain the nutrients you need and are low in calories. Decrease your intake of foods high in solid fats, added sugars, and salt. Get information about a proper diet from your health care provider, if necessary.  Regular physical exercise is one of the most important things you can do for your health. Most adults should get at least 150 minutes of moderate-intensity exercise (any activity that increases your heart rate and causes you to sweat) each week. In addition, most adults need muscle-strengthening exercises on 2 or more days a week.   Maintain a healthy weight. The body mass index (BMI) is a screening tool to identify possible weight problems. It provides an estimate of body fat based on height and weight. Your health care provider can find your BMI and can help you achieve or maintain a healthy weight. For males 20 years and older:  A BMI below 18.5 is considered underweight.  A BMI of 18.5 to 24.9 is normal.  A BMI of 25 to 29.9 is considered overweight.  A BMI of 30 and above is considered obese.  Maintain normal blood lipids and cholesterol by exercising and minimizing your intake of saturated fat. Eat a balanced diet with plenty of fruits and vegetables. Blood tests for lipids and cholesterol should begin at age 3 and be repeated every 5 years. If your lipid or cholesterol levels are high, you are over age 58, or you are at high risk for heart disease, you may need your cholesterol levels checked more frequently.Ongoing high lipid and cholesterol levels should be  treated with medicines if diet and exercise are not working.  If you smoke, find out from your health care provider how to quit. If you do not use tobacco, do not start.  Lung cancer screening is recommended for adults aged 40-80 years who are at high risk for developing lung cancer because of a history of smoking. A yearly low-dose CT scan of the lungs is recommended for people who have at least a 30-pack-year history of smoking and are current smokers or have quit within the past 15 years. A pack year of smoking is smoking an average of 1 pack of cigarettes a day for 1 year (for example, a 30-pack-year history of smoking could mean smoking 1 pack a day for 30 years or 2 packs a day for 15 years). Yearly screening should continue until the smoker has stopped smoking for at least 15 years. Yearly screening should be stopped for people who develop a health problem that would prevent them from having lung cancer treatment.  If you choose to drink alcohol, do not have more than 2 drinks per day. One drink is considered to be 12 oz (360 mL) of beer, 5 oz (150 mL) of wine, or 1.5 oz (45 mL) of liquor.  Avoid the use of street drugs. Do not share needles with anyone. Ask for help if you need support or instructions about stopping the use of drugs.  High blood pressure causes heart disease and  increases the risk of stroke. High blood pressure is more likely to develop in:  People who have blood pressure in the end of the normal range (100-139/85-89 mm Hg).  People who are overweight or obese.  People who are African American.  If you are 55-71 years of age, have your blood pressure checked every 3-5 years. If you are 66 years of age or older, have your blood pressure checked every year. You should have your blood pressure measured twice--once when you are at a hospital or clinic, and once when you are not at a hospital or clinic. Record the average of the two measurements. To check your blood pressure  when you are not at a hospital or clinic, you can use:  An automated blood pressure machine at a pharmacy.  A home blood pressure monitor.  If you are 76-64 years old, ask your health care provider if you should take aspirin to prevent heart disease.  Diabetes screening involves taking a blood sample to check your fasting blood sugar level. This should be done once every 3 years after age 37 if you are at a normal weight and without risk factors for diabetes. Testing should be considered at a younger age or be carried out more frequently if you are overweight and have at least 1 risk factor for diabetes.  Colorectal cancer can be detected and often prevented. Most routine colorectal cancer screening begins at the age of 29 and continues through age 47. However, your health care provider may recommend screening at an earlier age if you have risk factors for colon cancer. On a yearly basis, your health care provider may provide home test kits to check for hidden blood in the stool. A small camera at the end of a tube may be used to directly examine the colon (sigmoidoscopy or colonoscopy) to detect the earliest forms of colorectal cancer. Talk to your health care provider about this at age 79 when routine screening begins. A direct exam of the colon should be repeated every 5-10 years through age 92, unless early forms of precancerous polyps or small growths are found.  People who are at an increased risk for hepatitis B should be screened for this virus. You are considered at high risk for hepatitis B if:  You were born in a country where hepatitis B occurs often. Talk with your health care provider about which countries are considered high risk.  Your parents were born in a high-risk country and you have not received a shot to protect against hepatitis B (hepatitis B vaccine).  You have HIV or AIDS.  You use needles to inject street drugs.  You live with, or have sex with, someone who has  hepatitis B.  You are a man who has sex with other men (MSM).  You get hemodialysis treatment.  You take certain medicines for conditions like cancer, organ transplantation, and autoimmune conditions.  Hepatitis C blood testing is recommended for all people born from [birth year] through 1967 and any individual with known risk factors for hepatitis C.  Healthy men should no longer receive prostate-specific antigen (PSA) blood tests as part of routine cancer screening. Talk to your health care provider about prostate cancer screening.  Testicular cancer screening is not recommended for adolescents or adult males who have no symptoms. Screening includes self-exam, a health care provider exam, and other screening tests. Consult with your health care provider about any symptoms you have or any concerns you have about testicular cancer.  Practice safe sex. Use condoms and avoid high-risk sexual practices to reduce the spread of sexually transmitted infections (STIs).  You should be screened for STIs, including gonorrhea and chlamydia if:  You are sexually active and are younger than 24 years.  You are older than 24 years, and your health care provider tells you that you are at risk for this type of infection.  Your sexual activity has changed since you were last screened, and you are at an increased risk for chlamydia or gonorrhea. Ask your health care provider if you are at risk.  If you are at risk of being infected with HIV, it is recommended that you take a prescription medicine daily to prevent HIV infection. This is called pre-exposure prophylaxis (PrEP). You are considered at risk if:  You are a man who has sex with other men (MSM).  You are a heterosexual man who is sexually active with multiple partners.  You take drugs by injection.  You are sexually active with a partner who has HIV.  Talk with your health care provider about whether you are at high risk of being infected with HIV. If  you choose to begin PrEP, you should first be tested for HIV. You should then be tested every 3 months for as long as you are taking PrEP.  Use sunscreen. Apply sunscreen liberally and repeatedly throughout the day. You should seek shade when your shadow is shorter than you. Protect yourself by wearing long sleeves, pants, a wide-brimmed hat, and sunglasses year round whenever you are outdoors.  Tell your health care provider of new moles or changes in moles, especially if there is a change in shape or color. Also, tell your health care provider if a mole is larger than the size of a pencil eraser.  A one-time screening for abdominal aortic aneurysm (AAA) and surgical repair of large AAAs by ultrasound is recommended for men aged 68-75 years who are current or former smokers.  Stay current with your vaccines (immunizations).   This information is not intended to replace advice given to you by your health care provider. Make sure you discuss any questions you have with your health care provider.   Document Released: 08/14/2007 Document Revised: 03/08/2014 Document Reviewed: 07/13/2010 Elsevier Interactive Patient Education [employer].

## 2015-10-02 NOTE — Progress Notes (Signed)
Patient ID: Jonathan Simon, male    DOB: [birth year]  Age: 55 y.o. MRN: [id number]  The patient is here for annual wellness examination,  management of hypertension and hyperlipidemia as well as any acute problems.   The risk factors are reflected in the social history.  The roster of all physicians providing medical care to patient - is listed in the Snapshot section of the chart. Home safety : The patient has smoke detectors in the home. They wear seatbelts.  There are no firearms at home. There is no violence in the home.   There is no risks for hepatitis, STDs or HIV. There is no   history of blood transfusion. They have no travel history to infectious disease endemic areas of the world.  The patient has seen their dentist in the last six month. T   They do not  have excessive sun exposure. Discussed the need for sun protection: hats, long sleeves and use of sunscreen if there is significant sun exposure.   Diet: the importance of a healthy diet is discussed. They do have a healthy diet.  The benefits of regular aerobic exercise were discussed. She walks 4 times per week ,  20 minutes.   Depression screen: there are no signs or vegative symptoms of depression- irritability, change in appetite, anhedonia, sadness/tearfullness.   The following portions of the patient's history were reviewed and updated as appropriate: allergies, current medications, past family history, past medical history,  past surgical history, past social history  and problem list.  Visual acuity was not assessed per patient preference since he has regular follow up with her ophthalmologist. Hearing and body mass index were assessed and reviewed.   During the course of the visit the patient was educated and counseled about appropriate screening and preventive services including :  , diabetes screening, nutrition counseling, colorectal cancer screening, and recommended immunizations.    CC: The primary encounter  diagnosis was Essential hypertension. Diagnoses of Hyperlipidemia, Prostate cancer screening, Other fatigue, Hyperlipidemia with target LDL less than 100, Encounter for preventive health examination, Rib pain on left side, and Screening for colon cancer were also pertinent to this visit.  Had an ER visit in December after a syncopal episode which occurred while waiting at the dentist's office to have a tooth abscess treated at the dentist's office.  Syncope was brought on fatigue and pain from several sleepless nights leading up to the dental procedure.  The abscess was treated before he went to the  ER .  ER visit reviewed with patient today including any labs imaging studies and EKGS  More recently he had an ER visit after  fall which occurred while climbing up the steps  [place]. He had severe left sided rib pain, was evaluated at hte local ER with plain films which failed to show a rib fracture. He continues to report pain on his left lateral side with side bends and twisting .    History K[place] has a past medical history of Acute maxillary sinusitis; Acute upper respiratory infections of unspecified site; Edema; Esophageal reflux; Essential hypertension, benign; External hemorrhoids without mention of complication; Other and unspecified hyperlipidemia; Other malaise and fatigue; Spontaneous pneumothorax; and Treadmill stress test negative for angina pectoris (2006).   He has a past surgical history that includes [place] pleurodesis; Inguinal hernia repair; laparatomy; and Hemorrhoid surgery.   His family history includes COPD (age of onset: 27) in his mother; Heart disease in his paternal grandfather;  Heart disease (age of onset: 7) in his father.He reports that he has never smoked. He has never used smokeless tobacco. He reports that he drinks about 4.2 oz of alcohol per week . He reports that he does not use drugs.  Outpatient Medications Prior to Visit  Medication Sig Dispense  Refill  . albuterol (PROVENTIL HFA;VENTOLIN HFA) 108 (90 BASE) MCG/ACT inhaler Inhale 2 puffs into the lungs every 6 (six) hours as needed for wheezing. 1 Inhaler 0  . amLODipine (NORVASC) 10 MG tablet Take 1 tablet (10 mg total) by mouth daily. 30 tablet 3  . fluticasone (VERAMYST) 27.5 MCG/SPRAY nasal spray Place 2 sprays into the nose daily.      [place] losartan (COZAAR) 50 MG tablet Take 1 tablet (50 mg total) by mouth daily. 30 tablet 5  . niacin 500 MG tablet Take 500 mg by mouth daily with breakfast.      . polyethylene glycol-electrolytes (NULYTELY/GOLYTELY) 420 G solution Drink an 8 ounce glass every 30 minutes  Until constipation is relieved 4000 mL 0  . pseudoephedrine (SUDAFED) 30 MG tablet Take 30 mg by mouth every 6 (six) hours as needed.      . simvastatin (ZOCOR) 20 MG tablet Take 1 tablet (20 mg total) by mouth daily at 6 PM. 45 tablet 5  . diclofenac (VOLTAREN) 75 MG EC tablet Take 1 tablet (75 mg total) by mouth 2 (two) times daily. 60 tablet 2  . Tadalafil 2.5 MG TABS Take 1 tablet (2.5 mg total) by mouth daily. 60 each 2  . tiZANidine (ZANAFLEX) 4 MG capsule Take 1 capsule (4 mg total) by mouth 3 (three) times daily as needed for muscle spasms (or jaw pain). 60 capsule 1   No facility-administered medications prior to visit.     Review of Systems   Patient denies headache, fevers, malaise, unintentional weight loss, skin rash, eye pain, sinus congestion and sinus pain, sore throat, dysphagia,  hemoptysis , cough, dyspnea, wheezing, chest pain, palpitations, orthopnea, edema, abdominal pain, nausea, melena, diarrhea, constipation, flank pain, dysuria, hematuria, urinary  Frequency, nocturia, numbness, tingling, seizures,  Focal weakness, Loss of consciousness,  Tremor, insomnia, depression, anxiety, and suicidal ideation.     Objective:  BP 110/78   Pulse (!) 56   Temp 97.9 F (36.6 C) (Oral)   Ht 5' 9.5" (1.765 m)   Wt 195 lb 8 oz (88.7 kg)   SpO2 97%   BMI 28.46 kg/m    Physical Exam   General appearance: alert, cooperative and appears stated age Ears: normal TM's and external ear canals both ears Throat: lips, mucosa, and tongue normal; teeth and gums normal Neck: no adenopathy, no carotid bruit, supple, symmetrical, trachea midline and thyroid not enlarged, symmetric, no tenderness/mass/nodules Back: symmetric, no curvature. ROM normal.Tenderness over lump om left lateral chest wall.  No bruising . Lungs: clear to auscultation bilaterally Heart: regular rate and rhythm, S1, S2 normal, no murmur, click, rub or gallop Abdomen: soft, non-tender; bowel sounds normal; no masses,  no organomegaly Pulses: 2+ and symmetric Skin: Skin color, texture, turgor normal. No rashes or lesions Lymph nodes: Cervical, supraclavicular, and axillary nodes normal.    Assessment & Plan:   Problem List Items Addressed This Visit    Hypertension - Primary (Chronic)    Well controlled on current regimen. Renal function stable, no changes today. Lab Results  Component Value Date   CREATININE 0.88 10/02/2015   Lab Results  Component Value Date   NA 139 10/02/2015  K 4.4 10/02/2015   CL 105 10/02/2015   CO2 28 10/02/2015         Relevant Orders   Comprehensive metabolic panel (Completed)   Hyperlipidemia with target LDL less than 100 (Chronic)    LDL and triglycerides are at goal on current medications. He has no side effects and liver enzymes are normal. No changes today  Lab Results  Component Value Date   CHOL 144 10/02/2015   HDL 51.10 10/02/2015   LDLCALC 78 10/02/2015   LDLDIRECT 88.0 10/23/2014   TRIG 71.0 10/02/2015   CHOLHDL 3 10/02/2015   Lab Results  Component Value Date   ALT 17 10/02/2015   AST 19 10/02/2015   ALKPHOS 76 10/02/2015   BILITOT 1.8 (H) 10/02/2015           Encounter for preventive health examination    Annual comprehensive preventive exam was done as well as an evaluation and management of acute and chronic  conditions .  During the course of the visit the patient was educated and counseled about appropriate screening and preventive services including :  diabetes screening, lipid analysis with projected  10 year  risk for CAD , nutrition counseling, prostate and colorectal cancer screening, and recommended immunizations.  Printed recommendations for health maintenance screenings was given.       Screening for colon cancer    He was referred in 2013 for screening colonoscopy but deferred.  Referring again       Relevant Orders   Ambulatory referral to Gastroenterology   Rib pain on left side    Unilateral rib films offered to rule out fracture not seen initally However, given that treatment would be unchanged, he deferred       Other Visit Diagnoses    Hyperlipidemia       Relevant Orders   Lipid panel (Completed)   Prostate cancer screening       Relevant Orders   PSA (Completed)   Other fatigue       Relevant Orders   TSH (Completed)   CBC with Differential/Platelet (Completed)      I have discontinued Mr. Melland's tiZANidine, diclofenac, and Tadalafil. I am also having him maintain his pseudoephedrine, fluticasone, niacin, albuterol, polyethylene glycol-electrolytes, simvastatin, losartan, and amLODipine.  No orders of the defined types were placed in this encounter.   Medications Discontinued During This Encounter  Medication Reason  . tiZANidine (ZANAFLEX) 4 MG capsule Patient Preference  . Tadalafil 2.5 MG TABS Patient Preference  . diclofenac (VOLTAREN) 75 MG EC tablet Patient Preference    Follow-up: No Follow-up on file.   Crecencio Mc, MD

## 2015-10-05 DIAGNOSIS — R0781 Pleurodynia: Secondary | ICD-10-CM | POA: Insufficient documentation

## 2015-10-05 NOTE — Assessment & Plan Note (Signed)
LDL and triglycerides are at goal on current medications. He has no side effects and liver enzymes are normal. No changes today  Lab Results  Component Value Date   CHOL 144 10/02/2015   HDL 51.10 10/02/2015   LDLCALC 78 10/02/2015   LDLDIRECT 88.0 10/23/2014   TRIG 71.0 10/02/2015   CHOLHDL 3 10/02/2015   Lab Results  Component Value Date   ALT 17 10/02/2015   AST 19 10/02/2015   ALKPHOS 76 10/02/2015   BILITOT 1.8 (H) 10/02/2015

## 2015-10-05 NOTE — Assessment & Plan Note (Signed)
Well controlled on current regimen. Renal function stable, no changes today. Lab Results  Component Value Date   CREATININE 0.88 10/02/2015   Lab Results  Component Value Date   NA 139 10/02/2015   K 4.4 10/02/2015   CL 105 10/02/2015   CO2 28 10/02/2015

## 2015-10-05 NOTE — Assessment & Plan Note (Signed)

## 2015-10-05 NOTE — Assessment & Plan Note (Signed)
He was referred in 2013 for screening colonoscopy but deferred.  Referring again

## 2015-10-05 NOTE — Assessment & Plan Note (Signed)
Unilateral rib films offered to rule out fracture not seen initally However, given that treatment would be unchanged, he deferred

## 2015-10-06 NOTE — Progress Notes (Signed)
Note done in error.

## 2015-10-09 ENCOUNTER — Encounter: Payer: Self-pay | Admitting: Internal Medicine

## 2015-10-24 ENCOUNTER — Other Ambulatory Visit: Payer: Self-pay | Admitting: Internal Medicine

## 2015-10-24 MED ORDER — LOSARTAN POTASSIUM 50 MG PO TABS: 50.0000 mg | ORAL_TABLET | Freq: Every day | ORAL | 5 refills | Status: DC

## 2015-10-24 NOTE — Telephone Encounter (Signed)
Medication refill for losartan filled.

## 2015-12-24 ENCOUNTER — Telehealth: Payer: Self-pay | Admitting: Internal Medicine

## 2015-12-24 MED ORDER — SIMVASTATIN 20 MG PO TABS: 20.0000 mg | ORAL_TABLET | Freq: Every day | ORAL | 5 refills | Status: DC

## 2015-12-24 MED ORDER — AMLODIPINE BESYLATE 10 MG PO TABS: 10.0000 mg | ORAL_TABLET | Freq: Every day | ORAL | 3 refills | Status: DC

## 2015-12-24 NOTE — Telephone Encounter (Signed)
Pt called stating that he needs a new rx for simvastatin (ZOCOR) 20 MG tablet, losartan (COZAAR) 50 MG tablet, and amLODipine (NORVASC) 10 MG tablet. Thank you!  [place], [place]  Call pt @ [phone]

## 2015-12-24 NOTE — Telephone Encounter (Signed)
Refill sent.

## 2016-03-18 ENCOUNTER — Ambulatory Visit: Payer: Managed Care, Other (non HMO) | Admitting: Internal Medicine

## 2016-04-22 ENCOUNTER — Ambulatory Visit (INDEPENDENT_AMBULATORY_CARE_PROVIDER_SITE_OTHER): Payer: Managed Care, Other (non HMO) | Admitting: Internal Medicine

## 2016-04-22 ENCOUNTER — Encounter: Payer: Self-pay | Admitting: Internal Medicine

## 2016-04-22 VITALS — BP 128/90 | HR 61 | Resp 16 | Wt 207.0 lb

## 2016-04-22 DIAGNOSIS — Z1211 Encounter for screening for malignant neoplasm of colon: Secondary | ICD-10-CM

## 2016-04-22 DIAGNOSIS — I10 Essential (primary) hypertension: Secondary | ICD-10-CM | POA: Diagnosis not present

## 2016-04-22 DIAGNOSIS — E7800 Pure hypercholesterolemia, unspecified: Secondary | ICD-10-CM | POA: Diagnosis not present

## 2016-04-22 DIAGNOSIS — E785 Hyperlipidemia, unspecified: Secondary | ICD-10-CM | POA: Diagnosis not present

## 2016-04-22 DIAGNOSIS — E66811 Obesity, class 1: Secondary | ICD-10-CM

## 2016-04-22 DIAGNOSIS — R635 Abnormal weight gain: Secondary | ICD-10-CM | POA: Diagnosis not present

## 2016-04-22 DIAGNOSIS — R5383 Other fatigue: Secondary | ICD-10-CM

## 2016-04-22 DIAGNOSIS — E669 Obesity, unspecified: Secondary | ICD-10-CM

## 2016-04-22 DIAGNOSIS — F1010 Alcohol abuse, uncomplicated: Secondary | ICD-10-CM

## 2016-04-22 LAB — COMPREHENSIVE METABOLIC PANEL
ALT: 22 U/L (ref 0–53)
AST: 26 U/L (ref 0–37)
Albumin: 4.7 g/dL (ref 3.5–5.2)
Alkaline Phosphatase: 62 U/L (ref 39–117)
BILIRUBIN TOTAL: 1.8 mg/dL — AB (ref 0.2–1.2)
BUN: 12 mg/dL (ref 6–23)
CO2: 30 meq/L (ref 19–32)
Calcium: 9.6 mg/dL (ref 8.4–10.5)
Chloride: 105 mEq/L (ref 96–112)
Creatinine, Ser: 1 mg/dL (ref 0.40–1.50)
GFR: 81.72 mL/min (ref >60.00)
Glucose, Bld: 92 mg/dL (ref 70–99)
Potassium: 4.7 mEq/L (ref 3.5–5.1)
SODIUM: 138 meq/L (ref 135–145)
Total Protein: 7.1 g/dL (ref 6.0–8.3)

## 2016-04-22 LAB — CBC WITH DIFFERENTIAL/PLATELET
BASOS PCT: 0.9 % (ref 0.0–3.0)
Basophils Absolute: 0.1 10*3/uL (ref 0.0–0.1)
EOS ABS: 0.1 10*3/uL (ref 0.0–0.7)
Eosinophils Relative: 2.6 % (ref 0.0–5.0)
HCT: 46.4 % (ref 39.0–52.0)
HEMOGLOBIN: 15.8 g/dL (ref 13.0–17.0)
Lymphocytes Relative: 25.3 % (ref 12.0–46.0)
Lymphs Abs: 1.4 10*3/uL (ref 0.7–4.0)
MCHC: 34.1 g/dL (ref 30.0–36.0)
MCV: 89.7 fl (ref 78.0–100.0)
MONO ABS: 0.6 10*3/uL (ref 0.1–1.0)
Monocytes Relative: 11.5 % (ref 3.0–12.0)
NEUTROS ABS: 3.4 10*3/uL (ref 1.4–7.7)
Neutrophils Relative %: 59.7 % (ref 43.0–77.0)
PLATELETS: 218 10*3/uL (ref 150.0–400.0)
RBC: 5.17 Mil/uL (ref 4.22–5.81)
RDW: 13.4 % (ref 11.5–15.5)
WBC: 5.6 10*3/uL (ref 4.0–10.5)

## 2016-04-22 LAB — LIPID PANEL
CHOL/HDL RATIO: 3
Cholesterol: 161 mg/dL (ref 0–200)
HDL: 51.3 mg/dL (ref >39.00)
LDL Cholesterol: 89 mg/dL (ref 0–99)
NONHDL: 109.97
Triglycerides: 107 mg/dL (ref 0.0–149.0)
VLDL: 21.4 mg/dL (ref 0.0–40.0)

## 2016-04-22 LAB — TSH: TSH: 1.01 u[IU]/mL (ref 0.35–4.50)

## 2016-04-22 LAB — HEMOGLOBIN A1C: Hgb A1c MFr Bld: 5.2 % (ref 4.6–6.5)

## 2016-04-22 MED ORDER — AMLODIPINE BESYLATE 10 MG PO TABS: 10.0000 mg | ORAL_TABLET | Freq: Every day | ORAL | 3 refills | Status: DC

## 2016-04-22 MED ORDER — DISULFIRAM 250 MG PO TABS: 250.0000 mg | ORAL_TABLET | Freq: Every day | ORAL | 0 refills | Status: DC

## 2016-04-22 MED ORDER — LOSARTAN POTASSIUM 50 MG PO TABS: 50.0000 mg | ORAL_TABLET | Freq: Every day | ORAL | 5 refills | Status: DC

## 2016-04-22 MED ORDER — SIMVASTATIN 20 MG PO TABS: 20.0000 mg | ORAL_TABLET | Freq: Every day | ORAL | 5 refills | Status: DC

## 2016-04-22 NOTE — Progress Notes (Signed)
Subjective:  Patient ID: Jonathan Simon, male    DOB: [birth year]  Age: 56 y.o. MRN: [id number]  CC: The primary encounter diagnosis was Colon cancer screening. Diagnoses of Fatigue, unspecified type, Pure hypercholesterolemia, Weight gain, Alcohol abuse, Essential hypertension, Hyperlipidemia with target LDL less than 100, Screening for colon cancer, and Obesity (BMI 30.0-34.9) were also pertinent to this visit.  HPI Jonathan Simon presents for folllow up on hypertension and hyperlipidemia  Due for fasting labs and colonoscopy  He is requesting assistance with alcohol abuse.  He states that he drinks no alcohol during the week, but starts drinking  at noon every other Saturday (on the Saturdays he does not work).  Drinks beer mostly, .  Has been going on for the past 4 months.   No desire during the week.   No history of violence   DUI or arguments, but his wife is tired of him being asleep on the couch by 5 pm,  Denies being unhappy in marriage or at job.  No recent triggers, but would like to see a psychologist to help "get things off his chest" that happened in the past    He has one brother abuses alcohol. Sisters do not (one sister has died) .  Other sister drinks only socially .  He does not smoke    Lab Results  Component Value Date   CHOL 161 04/22/2016   HDL 51.30 04/22/2016   LDLCALC 89 04/22/2016   LDLDIRECT 88.0 10/23/2014   TRIG 107.0 04/22/2016   CHOLHDL 3 04/22/2016     Outpatient Medications Prior to Visit  Medication Sig Dispense Refill  . albuterol (PROVENTIL HFA;VENTOLIN HFA) 108 (90 BASE) MCG/ACT inhaler Inhale 2 puffs into the lungs every 6 (six) hours as needed for wheezing. 1 Inhaler 0  . fluticasone (VERAMYST) 27.5 MCG/SPRAY nasal spray Place 2 sprays into the nose daily.      . niacin 500 MG tablet Take 500 mg by mouth daily with breakfast.      . pseudoephedrine (SUDAFED) 30 MG tablet Take 30 mg by mouth every 6 (six) hours as needed.      Marland Kitchen  amLODipine (NORVASC) 10 MG tablet Take 1 tablet (10 mg total) by mouth daily. 30 tablet 3  . losartan (COZAAR) 50 MG tablet Take 1 tablet (50 mg total) by mouth daily. 30 tablet 5  . polyethylene glycol-electrolytes (NULYTELY/GOLYTELY) 420 G solution Drink an 8 ounce glass every 30 minutes  Until constipation is relieved 4000 mL 0  . simvastatin (ZOCOR) 20 MG tablet Take 1 tablet (20 mg total) by mouth daily at 6 PM. 45 tablet 5   No facility-administered medications prior to visit.     Review of Systems;  Patient denies headache, fevers, malaise, unintentional weight loss, skin rash, eye pain, sinus congestion and sinus pain, sore throat, dysphagia,  hemoptysis , cough, dyspnea, wheezing, chest pain, palpitations, orthopnea, edema, abdominal pain, nausea, melena, diarrhea, constipation, flank pain, dysuria, hematuria, urinary  Frequency, nocturia, numbness, tingling, seizures,  Focal weakness, Loss of consciousness,  Tremor, insomnia, depression, anxiety, and suicidal ideation.      Objective:  BP 128/90   Pulse 61   Resp 16   Wt 207 lb (93.9 kg)   SpO2 96%   BMI 30.13 kg/m   BP Readings from Last 3 Encounters:  04/22/16 128/90  10/02/15 110/78  01/31/15 (!) 151/100    Wt Readings from Last 3 Encounters:  04/22/16 207 lb (93.9 kg)  10/02/15 195  lb 8 oz (88.7 kg)  01/31/15 198 lb (89.8 kg)    General appearance: alert, cooperative and appears stated age Ears: normal TM's and external ear canals both ears Throat: lips, mucosa, and tongue normal; teeth and gums normal Neck: no adenopathy, no carotid bruit, supple, symmetrical, trachea midline and thyroid not enlarged, symmetric, no tenderness/mass/nodules Back: symmetric, no curvature. ROM normal. No CVA tenderness. Lungs: clear to auscultation bilaterally Heart: regular rate and rhythm, S1, S2 normal, no murmur, click, rub or gallop Abdomen: soft, non-tender; bowel sounds normal; no masses,  no organomegaly Pulses: 2+ and  symmetric Skin: Skin color, texture, turgor normal. No rashes or lesions Lymph nodes: Cervical, supraclavicular, and axillary nodes normal. Psych: affect normal, makes good eye contact. No fidgeting,  Smiles easily.  Denies suicidal thoughts  Lab Results  Component Value Date   HGBA1C 5.2 04/22/2016    Lab Results  Component Value Date   CREATININE 1.00 04/22/2016   CREATININE 0.88 10/02/2015   CREATININE 0.92 01/31/2015    Lab Results  Component Value Date   WBC 5.6 04/22/2016   HGB 15.8 04/22/2016   HCT 46.4 04/22/2016   PLT 218.0 04/22/2016   GLUCOSE 92 04/22/2016   CHOL 161 04/22/2016   TRIG 107.0 04/22/2016   HDL 51.30 04/22/2016   LDLDIRECT 88.0 10/23/2014   LDLCALC 89 04/22/2016   ALT 22 04/22/2016   AST 26 04/22/2016   NA 138 04/22/2016   K 4.7 04/22/2016   CL 105 04/22/2016   CREATININE 1.00 04/22/2016   BUN 12 04/22/2016   CO2 30 04/22/2016   TSH 1.01 04/22/2016   PSA 0.65 10/02/2015   HGBA1C 5.2 04/22/2016    No results found.  Assessment & Plan:   Problem List Items Addressed This Visit    Alcohol abuse    He is requesting medication to deter him from drinking .  Antabuse trial recommended for Friday, Saturday and Sunday use.  Referral to psychology requested,  He was given the name of several respected male therapist       Hyperlipidemia with target LDL less than 100 (Chronic)    LDL and triglycerides are at goal on current medications. He has no side effects and liver enzymes are normal. No changes today  Lab Results  Component Value Date   CHOL 161 04/22/2016   HDL 51.30 04/22/2016   LDLCALC 89 04/22/2016   LDLDIRECT 88.0 10/23/2014   TRIG 107.0 04/22/2016   CHOLHDL 3 04/22/2016   Lab Results  Component Value Date   ALT 22 04/22/2016   AST 26 04/22/2016   ALKPHOS 62 04/22/2016   BILITOT 1.8 (H) 04/22/2016           Relevant Medications   amLODipine (NORVASC) 10 MG tablet   losartan (COZAAR) 50 MG tablet   simvastatin  (ZOCOR) 20 MG tablet   Hypertension (Chronic)    Well controlled on current regimen. Renal function stable, no changes today. Lab Results  Component Value Date   CREATININE 1.00 04/22/2016   Lab Results  Component Value Date   NA 138 04/22/2016   K 4.7 04/22/2016   CL 105 04/22/2016   CO2 30 04/22/2016         Relevant Medications   amLODipine (NORVASC) 10 MG tablet   losartan (COZAAR) 50 MG tablet   simvastatin (ZOCOR) 20 MG tablet   Obesity (BMI 30.0-34.9)    Mild, secondary to gain of 12 lbs since last visit,  Low gi diet and regular exercise  recommended,  Negative Screening for diabetes/hypothyroidism.    Lab Results  Component Value Date   HGBA1C 5.2 04/22/2016   Lab Results  Component Value Date   TSH 1.01 04/22/2016         Screening for colon cancer    Options discussed.  Referral for colonoscopy made.        Other Visit Diagnoses    Colon cancer screening    -  Primary   Relevant Orders   Ambulatory referral to General Surgery   Fatigue, unspecified type       Relevant Orders   Comprehensive metabolic panel (Completed)   TSH (Completed)   Pure hypercholesterolemia       Relevant Medications   amLODipine (NORVASC) 10 MG tablet   losartan (COZAAR) 50 MG tablet   simvastatin (ZOCOR) 20 MG tablet   Weight gain       Relevant Orders   Lipid panel (Completed)   Hemoglobin A1c (Completed)   CBC with Differential/Platelet (Completed)     A total of 25 minutes of face to face time was spent with patient more than half of which was spent in counselling about the above mentioned conditions  and coordination of care  I have discontinued Mr. Kitch's polyethylene glycol-electrolytes. I am also having him start on disulfiram. Additionally, I am having him maintain his pseudoephedrine, fluticasone, niacin, albuterol, amLODipine, losartan, and simvastatin.  Meds ordered this encounter  Medications  . amLODipine (NORVASC) 10 MG tablet    Sig: Take 1 tablet  (10 mg total) by mouth daily.    Dispense:  30 tablet    Refill:  3  . losartan (COZAAR) 50 MG tablet    Sig: Take 1 tablet (50 mg total) by mouth daily.    Dispense:  30 tablet    Refill:  5  . simvastatin (ZOCOR) 20 MG tablet    Sig: Take 1 tablet (20 mg total) by mouth daily at 6 PM.    Dispense:  45 tablet    Refill:  5  . disulfiram (ANTABUSE) 250 MG tablet    Sig: Take 1 tablet (250 mg total) by mouth daily.    Dispense:  30 tablet    Refill:  0    Medications Discontinued During This Encounter  Medication Reason  . polyethylene glycol-electrolytes (NULYTELY/GOLYTELY) 420 G solution Patient has not taken in last 30 days  . amLODipine (NORVASC) 10 MG tablet Reorder  . losartan (COZAAR) 50 MG tablet Reorder  . simvastatin (ZOCOR) 20 MG tablet Reorder    Follow-up: No Follow-up on file.   Crecencio Mc, MD

## 2016-04-22 NOTE — Progress Notes (Signed)
Pre visit review using our clinic review tool, if applicable. No additional management support is needed unless otherwise documented below in the visit note. 

## 2016-04-22 NOTE — Patient Instructions (Addendum)
Here are the names of several well respected male therapists   Lennon Alstrom    [phone] [place]   [phone] [place] [phone]  Achilles Dunk  [phone]  [place]   I recommend taking the Antabuse on Friday, Saturdays and Sundays to deter you from drinking alcohol  Cut back on your carbs to lose weight:   To make a low carb chip :  Take the Joseph's Lavash or Pita bread,  Or the [place] Low carb whole wheat tortilla   Place on metal cookie sheet  Brush with olive oil  Sprinkle garlic powder (NOT garlic salt), grated parmesan cheese, mediterranean seasoning , or all of them?  Bake at 275 for 30 minutes   We have substitutions for your potatoes and rice !!  Try the mashed cauliflower and riced cauliflower dishes instead of rice and mashed potatoes  Mashed turnips are also very low carb!   For desserts :  Try the Dannon Lt n Fit greek yogurt dessert flavors and top with reddi Whip .  8 carbs,  80 calories  Try Oikos Triple Zero [place] Yogurt in the salted caramel, and the coffee flavors  With Whipped Cream for dessert  breyer's low carb ice cream, available in bars (on a stick, better ) or scoopable ice cream  HERE ARE THE LOW CARB  BREAD CHOICES       ck on your carbs!

## 2016-04-23 ENCOUNTER — Encounter: Payer: Self-pay | Admitting: *Deleted

## 2016-04-24 ENCOUNTER — Encounter: Payer: Self-pay | Admitting: Internal Medicine

## 2016-04-24 DIAGNOSIS — F1010 Alcohol abuse, uncomplicated: Secondary | ICD-10-CM | POA: Insufficient documentation

## 2016-04-24 DIAGNOSIS — F1011 Alcohol abuse, in remission: Secondary | ICD-10-CM | POA: Insufficient documentation

## 2016-04-24 NOTE — Assessment & Plan Note (Signed)
Mild, secondary to gain of 12 lbs since last visit,  Low gi diet and regular exercise recommended,  Negative Screening for diabetes/hypothyroidism.    Lab Results  Component Value Date   HGBA1C 5.2 04/22/2016   Lab Results  Component Value Date   TSH 1.01 04/22/2016

## 2016-04-24 NOTE — Assessment & Plan Note (Signed)
LDL and triglycerides are at goal on current medications. He has no side effects and liver enzymes are normal. No changes today  Lab Results  Component Value Date   CHOL 161 04/22/2016   HDL 51.30 04/22/2016   LDLCALC 89 04/22/2016   LDLDIRECT 88.0 10/23/2014   TRIG 107.0 04/22/2016   CHOLHDL 3 04/22/2016   Lab Results  Component Value Date   ALT 22 04/22/2016   AST 26 04/22/2016   ALKPHOS 62 04/22/2016   BILITOT 1.8 (H) 04/22/2016

## 2016-04-24 NOTE — Assessment & Plan Note (Signed)
Options discussed.  Referral for colonoscopy made.

## 2016-04-24 NOTE — Assessment & Plan Note (Signed)
Well controlled on current regimen. Renal function stable, no changes today. Lab Results  Component Value Date   CREATININE 1.00 04/22/2016   Lab Results  Component Value Date   NA 138 04/22/2016   K 4.7 04/22/2016   CL 105 04/22/2016   CO2 30 04/22/2016

## 2016-04-24 NOTE — Assessment & Plan Note (Signed)
He is requesting medication to deter him from drinking .  Antabuse trial recommended for Friday, Saturday and Sunday use.  Referral to psychology requested,  He was given the name of several respected male therapist

## 2016-04-27 ENCOUNTER — Telehealth: Payer: Self-pay | Admitting: Internal Medicine

## 2016-04-27 NOTE — Telephone Encounter (Signed)
-----   Message from Crecencio Mc, MD sent at 04/25/2016  9:22 AM EST ----- Your CBC, thyroid , cholesterol,  liver and kidney function are normal. Continue your current medication and  Plan to repeat  Non fasting labs in 6 months.   Regards,  Dr. Derrel Nip

## 2016-04-27 NOTE — Telephone Encounter (Signed)
LMTCB regarding lab results.  

## 2016-04-27 NOTE — Telephone Encounter (Signed)
Pt is returning [place] call. Please cb [phone]

## 2016-04-27 NOTE — Telephone Encounter (Deleted)
See message in Stacie's result note box.

## 2016-04-28 ENCOUNTER — Telehealth: Payer: Self-pay | Admitting: Internal Medicine

## 2016-04-28 DIAGNOSIS — I10 Essential (primary) hypertension: Secondary | ICD-10-CM

## 2016-04-28 NOTE — Telephone Encounter (Signed)
Pt called back requesting lab results. Pt stated that he is leaving for work in 30 mins and would like a call before then. Please advise, thank you!  Call pt @ [phone]

## 2016-04-28 NOTE — Telephone Encounter (Signed)
Pt's wife informed of below. 6 month lab appt made- What labs will he need on October 25, 2016?  Notes Recorded by Adair Laundry, CMA on 04/26/2016 at 3:12 PM EST LMTCB. ------ Notes Recorded by Crecencio Mc, MD on 04/25/2016 at 9:22 AM EST Your CBC, thyroid , cholesterol, liver and kidney function are normal. Continue your current medication and Plan to repeat Non fasting labs in 6 months.   Regards,  Dr. Derrel Nip

## 2016-04-28 NOTE — Telephone Encounter (Signed)
cmet ordered 

## 2016-05-12 ENCOUNTER — Ambulatory Visit (INDEPENDENT_AMBULATORY_CARE_PROVIDER_SITE_OTHER): Payer: Managed Care, Other (non HMO) | Admitting: General Surgery

## 2016-05-12 ENCOUNTER — Encounter: Payer: Self-pay | Admitting: General Surgery

## 2016-05-12 VITALS — BP 118/76 | HR 66 | Resp 12 | Ht 71.0 in | Wt 209.0 lb

## 2016-05-12 DIAGNOSIS — Z1211 Encounter for screening for malignant neoplasm of colon: Secondary | ICD-10-CM | POA: Diagnosis not present

## 2016-05-12 MED ORDER — POLYETHYLENE GLYCOL 3350 17 GM/SCOOP PO POWD: ORAL | 0 refills | Status: DC
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
## 2016-05-12 NOTE — Patient Instructions (Signed)
Colonoscopy, Adult A colonoscopy is an exam to look at the entire large intestine. During the exam, a lubricated, bendable tube is inserted into the anus and then passed into the rectum, colon, and other parts of the large intestine. A colonoscopy is often done as a part of normal colorectal screening or in response to certain symptoms, such as anemia, persistent diarrhea, abdominal pain, and blood in the stool. The exam can help screen for and diagnose medical problems, including:  Tumors.  Polyps.  Inflammation.  Areas of bleeding. Tell a health care provider about:  Any allergies you have.  All medicines you are taking, including vitamins, herbs, eye drops, creams, and over-the-counter medicines.  Any problems you or family members have had with anesthetic medicines.  Any blood disorders you have.  Any surgeries you have had.  Any medical conditions you have.  Any problems you have had passing stool. What are the risks? Generally, this is a safe procedure. However, problems may occur, including:  Bleeding.  A tear in the intestine.  A reaction to medicines given during the exam.  Infection (rare). What happens before the procedure? Eating and drinking restrictions  Follow instructions from your health care provider about eating and drinking, which may include:  A few days before the procedure - follow a low-fiber diet. Avoid nuts, seeds, dried fruit, raw fruits, and vegetables.  1-3 days before the procedure - follow a clear liquid diet. Drink only clear liquids, such as clear broth or bouillon, black coffee or tea, clear juice, clear soft drinks or sports drinks, gelatin dessert, and popsicles. Avoid any liquids that contain red or purple dye.  On the day of the procedure - do not eat or drink anything during the 2 hours before the procedure, or within the time period that your health care provider recommends. Bowel prep  If you were prescribed an oral bowel prep to  clean out your colon:  Take it as told by your health care provider. Starting the day before your procedure, you will need to drink a large amount of medicated liquid. The liquid will cause you to have multiple loose stools until your stool is almost clear or light green.  If your skin or anus gets irritated from diarrhea, you may use these to relieve the irritation:  Medicated wipes, such as adult wet wipes with aloe and vitamin E.  A skin soothing-product like petroleum jelly.  If you vomit while drinking the bowel prep, take a break for up to 60 minutes and then begin the bowel prep again. If vomiting continues and you cannot take the bowel prep without vomiting, call your health care provider. General instructions   Ask your health care provider about changing or stopping your regular medicines. This is especially important if you are taking diabetes medicines or blood thinners.  Plan to have someone take you home from the hospital or clinic. What happens during the procedure?  An IV tube may be inserted into one of your veins.  You will be given medicine to help you relax (sedative).  To reduce your risk of infection:  Your health care team will wash or sanitize their hands.  Your anal area will be washed with soap.  You will be asked to lie on your side with your knees bent.  Your health care provider will lubricate a long, thin, flexible tube. The tube will have a camera and a light on the end.  The tube will be inserted into your anus.    The tube will be gently eased through your rectum and colon.  Air will be delivered into your colon to keep it open. You may feel some pressure or cramping.  The camera will be used to take images during the procedure.  A small tissue sample may be removed from your body to be examined under a microscope (biopsy). If any potential problems are found, the tissue will be sent to a lab for testing.  If small polyps are found, your health  care provider may remove them and have them checked for cancer cells.  The tube that was inserted into your anus will be slowly removed. The procedure may vary among health care providers and hospitals. What happens after the procedure?  Your blood pressure, heart rate, breathing rate, and blood oxygen level will be monitored until the medicines you were given have worn off.  Do not drive for 24 hours after the exam.  You may have a small amount of blood in your stool.  You may pass gas and have mild abdominal cramping or bloating due to the air that was used to inflate your colon during the exam.  It is up to you to get the results of your procedure. Ask your health care provider, or the department performing the procedure, when your results will be ready. This information is not intended to replace advice given to you by your health care provider. Make sure you discuss any questions you have with your health care provider. Document Released: 02/13/2000 Document Revised: 12/17/2015 Document Reviewed: 04/29/2015 Elsevier Interactive Patient Education  2017 Elsevier Inc.  

## 2016-06-21 ENCOUNTER — Telehealth: Payer: Self-pay | Admitting: Internal Medicine

## 2016-06-21 ENCOUNTER — Encounter: Payer: Self-pay | Admitting: *Deleted

## 2016-06-21 MED ORDER — DISULFIRAM 250 MG PO TABS: 250.0000 mg | ORAL_TABLET | Freq: Every day | ORAL | 5 refills | Status: DC

## 2016-06-21 NOTE — Telephone Encounter (Signed)
This medication is not in the pt's medication list.

## 2016-06-21 NOTE — Progress Notes (Signed)
Patient stopped by the office today to pick up a note for work to excuse him for his upcoming procedure.  This patient is scheduled for a colonoscopy on 06-25-16 at [facility].   Patient reports no change in health or medications since his last office visit.   Also, reminded to call the day before for arrival time.

## 2016-06-21 NOTE — Telephone Encounter (Signed)
LOOK UNDER HISTORICAL WHEN THIS HAPPENS,  BECAUSE AS SOON AS HE RUNS OUT IT GETS AUTOMATICALLY DC'D BY [employer] IF THERE ARE NO REFILLS  MED REFILLED

## 2016-06-21 NOTE — Telephone Encounter (Signed)
Pt called requesting a refill on disulfiram. Please advise, thank you!  [place], [place]  Call pt @ [phone]

## 2016-06-25 ENCOUNTER — Ambulatory Visit: Payer: Managed Care, Other (non HMO) | Admitting: Anesthesiology

## 2016-06-25 ENCOUNTER — Encounter
Admission: RE | Disposition: A | Discharge status: Home / Self Care | Payer: Self-pay | Source: Ambulatory Visit | Attending: General Surgery

## 2016-06-25 ENCOUNTER — Encounter: Payer: Self-pay | Admitting: *Deleted

## 2016-06-25 ENCOUNTER — Ambulatory Visit
Admission: RE | Admit: 2016-06-25 | Discharge: 2016-06-25 | Disposition: A | Discharge status: Home / Self Care | Payer: Managed Care, Other (non HMO) | Source: Ambulatory Visit | Attending: General Surgery | Admitting: General Surgery

## 2016-06-25 DIAGNOSIS — D128 Benign neoplasm of rectum: Secondary | ICD-10-CM | POA: Insufficient documentation

## 2016-06-25 DIAGNOSIS — K5730 Diverticulosis of large intestine without perforation or abscess without bleeding: Secondary | ICD-10-CM | POA: Diagnosis not present

## 2016-06-25 DIAGNOSIS — K219 Gastro-esophageal reflux disease without esophagitis: Secondary | ICD-10-CM | POA: Insufficient documentation

## 2016-06-25 DIAGNOSIS — Z1211 Encounter for screening for malignant neoplasm of colon: Secondary | ICD-10-CM | POA: Insufficient documentation

## 2016-06-25 DIAGNOSIS — E785 Hyperlipidemia, unspecified: Secondary | ICD-10-CM | POA: Diagnosis not present

## 2016-06-25 DIAGNOSIS — Z79899 Other long term (current) drug therapy: Secondary | ICD-10-CM | POA: Insufficient documentation

## 2016-06-25 DIAGNOSIS — K644 Residual hemorrhoidal skin tags: Secondary | ICD-10-CM | POA: Diagnosis not present

## 2016-06-25 DIAGNOSIS — K621 Rectal polyp: Secondary | ICD-10-CM | POA: Diagnosis not present

## 2016-06-25 DIAGNOSIS — I10 Essential (primary) hypertension: Secondary | ICD-10-CM | POA: Insufficient documentation

## 2016-06-25 HISTORY — PX: COLONOSCOPY WITH PROPOFOL: SHX5780

## 2016-06-25 SURGERY — COLONOSCOPY WITH PROPOFOL
Anesthesia: General

## 2016-06-25 MED ORDER — MIDAZOLAM HCL 2 MG/2ML IJ SOLN
INTRAMUSCULAR | Status: AC
  Filled 2016-06-25: qty 2

## 2016-06-25 MED ORDER — PROPOFOL 500 MG/50ML IV EMUL
INTRAVENOUS | Status: DC | PRN
Start: 2016-06-25 — End: 2016-06-25
  Administered 2016-06-25: 120 ug/kg/min via INTRAVENOUS

## 2016-06-25 MED ORDER — MIDAZOLAM HCL 2 MG/2ML IJ SOLN
INTRAMUSCULAR | Status: DC | PRN
  Administered 2016-06-25 (×2): 1 mg via INTRAVENOUS

## 2016-06-25 MED ORDER — PHENYLEPHRINE HCL 10 MG/ML IJ SOLN
INTRAMUSCULAR | Status: AC
  Filled 2016-06-25: qty 1

## 2016-06-25 MED ORDER — PROPOFOL 500 MG/50ML IV EMUL
INTRAVENOUS | Status: AC
  Filled 2016-06-25: qty 50

## 2016-06-25 MED ORDER — SODIUM CHLORIDE 0.9 % IV SOLN
INTRAVENOUS | Status: DC
  Administered 2016-06-25: 07:00:00 via INTRAVENOUS

## 2016-06-25 MED ORDER — PROPOFOL 10 MG/ML IV BOLUS
INTRAVENOUS | Status: DC | PRN
  Administered 2016-06-25: 110 mg via INTRAVENOUS

## 2016-06-25 MED ORDER — EPHEDRINE SULFATE 50 MG/ML IJ SOLN
INTRAMUSCULAR | Status: AC
  Filled 2016-06-25: qty 1

## 2016-06-25 NOTE — Transfer of Care (Signed)
Immediate Anesthesia Transfer of Care Note  Patient: Jonathan Simon  Procedure(s) Performed: Procedure(s): COLONOSCOPY WITH PROPOFOL (N/A)  Patient Location: PACU and Endoscopy Unit  Anesthesia Type:General  Level of Consciousness: drowsy and patient cooperative  Airway & Oxygen Therapy: Patient Spontanous Breathing and Patient connected to nasal cannula oxygen  Post-op Assessment: Report given to RN and Post -op Vital signs reviewed and stable  Post vital signs: Reviewed and stable  Last Vitals:  Vitals:   06/25/16 0706 06/25/16 0811  BP: (!) 157/98 116/88  Pulse: (!) 55 78  Resp: 16 17  Temp: (!) 36.1 C 36.1 C    Last Pain:  Vitals:   06/25/16 0811  TempSrc: Tympanic         Complications: No apparent anesthesia complications

## 2016-06-25 NOTE — Anesthesia Preprocedure Evaluation (Signed)
Anesthesia Evaluation  Patient identified by MRN, date of birth, ID band Patient awake    Reviewed: Allergy & Precautions, H&P , NPO status , Patient's Chart, lab work & pertinent test results, reviewed documented beta blocker date and time   History of Anesthesia Complications Negative for: history of anesthetic complications  Airway Mallampati: II  TM Distance: >3 FB Neck ROM: full    Dental   Pulmonary neg pulmonary ROS,           Cardiovascular Exercise Tolerance: Good hypertension, (-) angina(-) CAD, (-) Past MI, (-) Cardiac Stents and (-) CABG (-) dysrhythmias (-) Valvular Problems/Murmurs     Neuro/Psych negative neurological ROS  negative psych ROS   GI/Hepatic Neg liver ROS, GERD  ,  Endo/Other  negative endocrine ROS  Renal/GU negative Renal ROS  negative genitourinary   Musculoskeletal   Abdominal   Peds  Hematology negative hematology ROS (+)   Anesthesia Other Findings Past Medical History: No date: Acute maxillary sinusitis No date: Acute upper respiratory infections of unspecif* No date: Edema No date: Esophageal reflux No date: Essential hypertension, benign No date: External hemorrhoids without mention of compli* No date: Other and unspecified hyperlipidemia No date: Other malaise and fatigue No date: Spontaneous pneumothorax     Comment: bilateral s/p surgery/pleurodesis [facility]  2006: Treadmill stress test negative for angina pect*     Comment: [facility]   Reproductive/Obstetrics negative OB ROS                             Anesthesia Physical Anesthesia Plan  ASA: II  Anesthesia Plan: General   Post-op Pain Management:    Induction:   Airway Management Planned:   Additional Equipment:   Intra-op Plan:   Post-operative Plan:   Informed Consent: I have reviewed the patients History and Physical, chart, labs and discussed the procedure including the risks,  benefits and alternatives for the proposed anesthesia with the patient or authorized representative who has indicated his/her understanding and acceptance.   Dental Advisory Given  Plan Discussed with: Anesthesiologist, CRNA and Surgeon  Anesthesia Plan Comments:         Anesthesia Quick Evaluation

## 2016-06-25 NOTE — Op Note (Addendum)
[facility] Gastroenterology Patient Name: Jonathan Simon Procedure Date: 06/25/2016 7:27 AM MRN: [id number] Account #: [id number] Date of Birth: 1960/06/19 Admit Type: Outpatient Age: 56 Room: [facility] : [facility] ENDO ROOM 1 Gender: Male Note Status: Finalized Procedure:            Colonoscopy Indications:          Screening for colorectal malignant neoplasm Providers:            Robert Bellow, MD Referring MD:         Deborra Medina, MD (Referring MD) Medicines:            Monitored Anesthesia Care Complications:        No immediate complications. Procedure:            Pre-Anesthesia Assessment:                       - Prior to the procedure, a History and Physical was                        performed, and patient medications, allergies and                        sensitivities were reviewed. The patient's tolerance of                        previous anesthesia was reviewed.                       - The risks and benefits of the procedure and the                        sedation options and risks were discussed with the                        patient. All questions were answered and informed                        consent was obtained.                       After obtaining informed consent, the colonoscope was                        passed under direct vision. Throughout the procedure,                        the patient's blood pressure, pulse, and oxygen                        saturations were monitored continuously. The                        Colonoscope was introduced through the anus and                        advanced to the the terminal ileum. The colonoscopy was                        somewhat difficult due to significant looping and a  tortuous colon. Successful completion of the procedure                        was aided by applying abdominal pressure. The patient                        tolerated the procedure well. The quality of the bowel                      preparation was excellent. Findings:      A few medium-mouthed diverticula were found in the sigmoid colon.      A 8 mm polyp was found in the rectum. The polyp was sessile. Biopsies       were taken with a cold forceps for histology.      The retroflexed view of the distal rectum and anal verge was normal and       showed no anal or rectal abnormalities. Impression:           - Diverticulosis in the sigmoid colon.                       - One 8 mm polyp in the rectum. Biopsied.                       - The distal rectum and anal verge are normal on                        retroflexion view. Recommendation:       - Telephone endoscopist for pathology results in 1 week. Procedure Code(s):    --- Professional ---                       [phone], Colonoscopy, flexible; with biopsy, single or                        multiple Diagnosis Code(s):    --- Professional ---                       Z12.11, Encounter for screening for malignant neoplasm                        of colon                       K62.1, Rectal polyp                       K57.30, Diverticulosis of large intestine without                        perforation or abscess without bleeding CPT copyright 2016 [employer] Association. All rights reserved. The codes documented in this report are preliminary and upon coder review may  be revised to meet current compliance requirements. Robert Bellow, MD 06/25/2016 8:10:44 AM This report has been signed electronically. Number of Addenda: 0 Note Initiated On: 06/25/2016 7:27 AM Scope Withdrawal Time: 0 hours 12 minutes 2 seconds  Total Procedure Duration: 0 hours 24 minutes 4 seconds       [facility]

## 2016-06-25 NOTE — Anesthesia Post-op Follow-up Note (Cosign Needed)
Anesthesia QCDR form completed.        

## 2016-06-25 NOTE — Anesthesia Postprocedure Evaluation (Signed)
Anesthesia Post Note  Patient: Jonathan Simon  Procedure(s) Performed: Procedure(s) (LRB): COLONOSCOPY WITH PROPOFOL (N/A)  Patient location during evaluation: Endoscopy Anesthesia Type: General Level of consciousness: awake and alert Pain management: pain level controlled Vital Signs Assessment: post-procedure vital signs reviewed and stable Respiratory status: spontaneous breathing, nonlabored ventilation, respiratory function stable and patient connected to nasal cannula oxygen Cardiovascular status: blood pressure returned to baseline and stable Postop Assessment: no signs of nausea or vomiting Anesthetic complications: no     Last Vitals:  Vitals:   06/25/16 0831 06/25/16 0841  BP: 114/73 119/77  Pulse: (!) 54 (!) 54  Resp: 18 15  Temp:      Last Pain:  Vitals:   06/25/16 0811  TempSrc: Tympanic                 Martha Clan
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
## 2016-06-28 ENCOUNTER — Telehealth: Payer: Self-pay

## 2016-06-28 ENCOUNTER — Encounter: Payer: Self-pay | Admitting: General Surgery

## 2016-06-28 LAB — SURGICAL PATHOLOGY

## 2016-06-28 NOTE — Telephone Encounter (Signed)
-----   Message from Robert Bellow, MD sent at 06/28/2016  2:50 PM EDT ----- Please notify the patient that the polyps was fine, but he should plan on a repeat exam in five years. Thanks.  ----- Message ----- From: Interface, Lab In Three Zero One Sent: 06/28/2016   9:29 AM To: Robert Bellow, MD

## 2016-06-29 NOTE — Telephone Encounter (Signed)
Notified patient as instructed, patient pleased. Discussed follow-up appointments, patient agrees. Patient placed in recalls.   

## 2016-09-29 ENCOUNTER — Other Ambulatory Visit: Payer: Self-pay | Admitting: Internal Medicine

## 2016-10-25 ENCOUNTER — Other Ambulatory Visit: Payer: 59

## 2016-10-25 ENCOUNTER — Encounter: Payer: Self-pay | Admitting: Internal Medicine

## 2016-10-25 ENCOUNTER — Ambulatory Visit (INDEPENDENT_AMBULATORY_CARE_PROVIDER_SITE_OTHER): Payer: 59 | Admitting: Internal Medicine

## 2016-10-25 VITALS — BP 128/86 | HR 62 | Temp 98.4°F | Resp 15 | Ht 71.0 in | Wt 212.0 lb

## 2016-10-25 DIAGNOSIS — E785 Hyperlipidemia, unspecified: Secondary | ICD-10-CM

## 2016-10-25 DIAGNOSIS — Z125 Encounter for screening for malignant neoplasm of prostate: Secondary | ICD-10-CM | POA: Diagnosis not present

## 2016-10-25 DIAGNOSIS — Z0000 Encounter for general adult medical examination without abnormal findings: Secondary | ICD-10-CM | POA: Diagnosis not present

## 2016-10-25 DIAGNOSIS — I10 Essential (primary) hypertension: Secondary | ICD-10-CM

## 2016-10-25 DIAGNOSIS — E663 Overweight: Secondary | ICD-10-CM

## 2016-10-25 LAB — COMPREHENSIVE METABOLIC PANEL
ALT: 29 U/L (ref 0–53)
AST: 24 U/L (ref 0–37)
Albumin: 4.5 g/dL (ref 3.5–5.2)
Alkaline Phosphatase: 59 U/L (ref 39–117)
BILIRUBIN TOTAL: 1 mg/dL (ref 0.2–1.2)
BUN: 12 mg/dL (ref 6–23)
CALCIUM: 9.8 mg/dL (ref 8.4–10.5)
CHLORIDE: 104 meq/L (ref 96–112)
CO2: 30 meq/L (ref 19–32)
Creatinine, Ser: 1 mg/dL (ref 0.40–1.50)
GFR: 81.57 mL/min (ref >60.00)
Glucose, Bld: 95 mg/dL (ref 70–99)
POTASSIUM: 4.7 meq/L (ref 3.5–5.1)
Sodium: 139 mEq/L (ref 135–145)
Total Protein: 7 g/dL (ref 6.0–8.3)

## 2016-10-25 LAB — PSA: PSA: 0.68 ng/mL (ref 0.10–4.00)

## 2016-10-25 MED ORDER — ZOSTER VAC RECOMB ADJUVANTED 50 MCG/0.5ML IM SUSR: 0.5000 mL | Freq: Once | INTRAMUSCULAR | 1 refills | Status: AC

## 2016-10-25 NOTE — Progress Notes (Signed)
Patient ID: Jonathan Simon, male    DOB: 17-May-1960  Age: 56 y.o. MRN: [id number]  The patient is here for annual preventive examination and management of other chronic and acute problems.   The risk factors are reflected in the social history.  The roster of all physicians providing medical care to patient - is listed in the Snapshot section of the chart.  Activities of daily living:  The patient is 100% independent in all ADLs: dressing, toileting, feeding as well as independent mobility  Home safety : The patient has smoke detectors in the home. They wear seatbelts.  There are no firearms at home. There is no violence in the home.   There is no risks for hepatitis, STDs or HIV. There is no   history of blood transfusion. They have no travel history to infectious disease endemic areas of the world.  The patient has seen their dentist in the last six month. They have seen their eye doctor in the last year. They admit to slight hearing difficulty with regard to whispered voices and some television programs.  They have deferred audiologic testing in the last year.  They do not  have excessive sun exposure. Discussed the need for sun protection: hats, long sleeves and use of sunscreen if there is significant sun exposure.   Diet: the importance of a healthy diet is discussed. They do have a healthy diet.  The benefits of regular aerobic exercise were discussed. She walks 4 times per week ,  20 minutes.   Depression screen: there are no signs or vegative symptoms of depression- irritability, change in appetite, anhedonia, sadness/tearfullness.  Cognitive assessment: the patient manages all their financial and personal affairs and is actively engaged. They could relate day,date,year and events; recalled 2/3 objects at 3 minutes; performed clock-face test normally.  The following portions of the patient's history were reviewed and updated as appropriate: allergies, current medications, past  family history, past medical history,  past surgical history, past social history  and problem list.  Visual acuity was not assessed per patient preference since she has regular follow up with her ophthalmologist. Hearing and body mass index were assessed and reviewed.   During the course of the visit the patient was educated and counseled about appropriate screening and preventive services including : fall prevention , diabetes screening, nutrition counseling, colorectal cancer screening, and recommended immunizations.    CC: The primary encounter diagnosis was Essential hypertension, benign. Diagnoses of Prostate cancer screening, Essential hypertension, Overweight, Hyperlipidemia with target LDL less than 100, and Encounter for preventive health examination were also pertinent to this visit.  History Tee has a past medical history of Acute maxillary sinusitis; Acute upper respiratory infections of unspecified site; Edema; Esophageal reflux; Essential hypertension, benign; External hemorrhoids without mention of complication; Other and unspecified hyperlipidemia; Other malaise and fatigue; Spontaneous pneumothorax; and Treadmill stress test negative for angina pectoris (2006).   He has a past surgical history that includes [facility] pleurodesis; Inguinal hernia repair; laparatomy; Hemorrhoid surgery; and Colonoscopy with propofol (N/A, 06/25/2016).   His family history includes COPD (age of onset: 60) in his mother; Heart disease in his paternal grandfather; Heart disease (age of onset: 61) in his father.He reports that he has never smoked. He has never used smokeless tobacco. He reports that he drinks about 4.2 oz of alcohol per week . He reports that he does not use drugs.  Outpatient Medications Prior to Visit  Medication Sig Dispense Refill  . albuterol (PROVENTIL HFA;VENTOLIN  HFA) 108 (90 BASE) MCG/ACT inhaler Inhale 2 puffs into the lungs every 6 (six) hours as needed for wheezing. 1 Inhaler 0   . amLODipine (NORVASC) 10 MG tablet TAKE 1 TABLET BY MOUTH EVERY DAY. 30 tablet 3  . fluticasone (VERAMYST) 27.5 MCG/SPRAY nasal spray Place 2 sprays into the nose as needed.     Marland Kitchen losartan (COZAAR) 50 MG tablet Take 1 tablet (50 mg total) by mouth daily. 30 tablet 5  . Multiple Vitamin (MULTIVITAMIN) capsule Take 1 capsule by mouth daily.    . niacin 500 MG tablet Take 500 mg by mouth daily with breakfast.      . pseudoephedrine (SUDAFED) 30 MG tablet Take 30 mg by mouth every 6 (six) hours as needed.      . simvastatin (ZOCOR) 20 MG tablet Take 1 tablet (20 mg total) by mouth daily at 6 PM. 45 tablet 5  . disulfiram (ANTABUSE) 250 MG tablet Take 1 tablet (250 mg total) by mouth daily. (Patient not taking: Reported on 10/25/2016) 30 tablet 5   No facility-administered medications prior to visit.     Review of Systems   Patient denies headache, fevers, malaise, unintentional weight loss, skin rash, eye pain, sinus congestion and sinus pain, sore throat, dysphagia,  hemoptysis , cough, dyspnea, wheezing, chest pain, palpitations, orthopnea, edema, abdominal pain, nausea, melena, diarrhea, constipation, flank pain, dysuria, hematuria, urinary  Frequency, nocturia, numbness, tingling, seizures,  Focal weakness, Loss of consciousness,  Tremor, insomnia, depression, anxiety, and suicidal ideation.      Objective:  BP 128/86 (BP Location: Left Arm, Patient Position: Sitting, Cuff Size: Large)   Pulse 62   Temp 98.4 F (36.9 C) (Oral)   Resp 15   Ht 5\' 11"  (1.803 m)   Wt 212 lb (96.2 kg)   SpO2 97%   BMI 29.57 kg/m   Physical Exam   General appearance: alert, cooperative and appears stated age Ears: normal TM's and external ear canals both ears Throat: lips, mucosa, and tongue normal; teeth and gums normal Neck: no adenopathy, no carotid bruit, supple, symmetrical, trachea midline and thyroid not enlarged, symmetric, no tenderness/mass/nodules Back: symmetric, no curvature. ROM normal.  No CVA tenderness. Lungs: clear to auscultation bilaterally Heart: regular rate and rhythm, S1, S2 normal, no murmur, click, rub or gallop Abdomen: soft, non-tender; bowel sounds normal; no masses,  no organomegaly Pulses: 2+ and symmetric Skin: Skin color, texture, turgor normal. No rashes or lesions Lymph nodes: Cervical, supraclavicular, and axillary nodes normal.    Assessment & Plan:   Problem List Items Addressed This Visit    Encounter for preventive health examination    Annual comprehensive preventive exam was done as well as an evaluation and management of acute and chronic conditions .  During the course of the visit the patient was educated and counseled about appropriate screening and preventive services including :  diabetes screening, lipid analysis with projected  10 year  risk for CAD , nutrition counseling, prostate and colorectal cancer screening, and recommended immunizations.  Printed recommendations for health maintenance screenings was given.       Hyperlipidemia with target LDL less than 100 (Chronic)    LDL and triglycerides are at goal on current medications. He has no side effects and liver enzymes are normal. No changes today  Lab Results  Component Value Date   CHOL 161 04/22/2016   HDL 51.30 04/22/2016   LDLCALC 89 04/22/2016   LDLDIRECT 88.0 10/23/2014   TRIG 107.0 04/22/2016  CHOLHDL 3 04/22/2016   Lab Results  Component Value Date   ALT 29 10/25/2016   AST 24 10/25/2016   ALKPHOS 59 10/25/2016   BILITOT 1.0 10/25/2016           Hypertension (Chronic)    Well controlled on current regimen. Renal function stable, no changes today.  Lab Results  Component Value Date   CREATININE 1.00 10/25/2016   Lab Results  Component Value Date   NA 139 10/25/2016   K 4.7 10/25/2016   CL 104 10/25/2016   CO2 30 10/25/2016         Overweight    I have congratulated him in reduction of   BMI and encouraged  Continued weight loss with goal of  10% of body weight over the next 6 months using a low glycemic index diet and regular exercise a minimum of 5 days per week.         Other Visit Diagnoses    Essential hypertension, benign    -  Primary   Relevant Orders   Comprehensive metabolic panel (Completed)   Prostate cancer screening       Relevant Orders   PSA (Completed)      I have discontinued Mr. Dingus's disulfiram. I am also having him start on Zoster Vac Recomb Adjuvanted. Additionally, I am having him maintain his pseudoephedrine, fluticasone, niacin, albuterol, losartan, simvastatin, multivitamin, and amLODipine.  Meds ordered this encounter  Medications  . Zoster Vac Recomb Adjuvanted [facility]) injection    Sig: Inject 0.5 mLs into the muscle once.    Dispense:  1 each    Refill:  1    Medications Discontinued During This Encounter  Medication Reason  . disulfiram (ANTABUSE) 250 MG tablet Patient has not taken in last 30 days    Follow-up: Return in about 6 months (around 04/27/2017).   Crecencio Mc, MD

## 2016-10-25 NOTE — Patient Instructions (Signed)

## 2016-10-25 NOTE — Addendum Note (Signed)
Addended by: Leeanne Rio on: 10/25/2016 08:41 AM   Modules accepted: Orders

## 2016-10-26 ENCOUNTER — Encounter: Payer: Self-pay | Admitting: Internal Medicine

## 2016-10-26 NOTE — Assessment & Plan Note (Signed)
I have congratulated him in reduction of   BMI and encouraged  Continued weight loss with goal of 10% of body weight over the next 6 months using a low glycemic index diet and regular exercise a minimum of 5 days per week.

## 2016-10-26 NOTE — Assessment & Plan Note (Signed)
Well controlled on current regimen. Renal function stable, no changes today.  Lab Results  Component Value Date   CREATININE 1.00 10/25/2016   Lab Results  Component Value Date   NA 139 10/25/2016   K 4.7 10/25/2016   CL 104 10/25/2016   CO2 30 10/25/2016

## 2016-10-26 NOTE — Assessment & Plan Note (Signed)

## 2016-10-26 NOTE — Assessment & Plan Note (Signed)
LDL and triglycerides are at goal on current medications. He has no side effects and liver enzymes are normal. No changes today  Lab Results  Component Value Date   CHOL 161 04/22/2016   HDL 51.30 04/22/2016   LDLCALC 89 04/22/2016   LDLDIRECT 88.0 10/23/2014   TRIG 107.0 04/22/2016   CHOLHDL 3 04/22/2016   Lab Results  Component Value Date   ALT 29 10/25/2016   AST 24 10/25/2016   ALKPHOS 59 10/25/2016   BILITOT 1.0 10/25/2016

## 2016-11-09 NOTE — Telephone Encounter (Signed)
Mailed unread message to patient.  

## 2016-12-11 ENCOUNTER — Other Ambulatory Visit: Payer: Self-pay | Admitting: Internal Medicine

## 2016-12-13 ENCOUNTER — Other Ambulatory Visit: Payer: Self-pay | Admitting: Internal Medicine

## 2017-02-02 ENCOUNTER — Other Ambulatory Visit: Payer: Self-pay | Admitting: Internal Medicine

## 2017-04-06 ENCOUNTER — Other Ambulatory Visit: Payer: Self-pay | Admitting: Internal Medicine

## 2017-05-02 ENCOUNTER — Ambulatory Visit: Payer: 59 | Admitting: Internal Medicine

## 2017-05-02 ENCOUNTER — Encounter: Payer: Self-pay | Admitting: Internal Medicine

## 2017-05-02 VITALS — BP 144/78 | HR 80 | Temp 98.6°F | Resp 14 | Ht 71.0 in | Wt 216.6 lb

## 2017-05-02 DIAGNOSIS — R233 Spontaneous ecchymoses: Secondary | ICD-10-CM | POA: Diagnosis not present

## 2017-05-02 DIAGNOSIS — R002 Palpitations: Secondary | ICD-10-CM

## 2017-05-02 DIAGNOSIS — E785 Hyperlipidemia, unspecified: Secondary | ICD-10-CM | POA: Diagnosis not present

## 2017-05-02 DIAGNOSIS — I776 Arteritis, unspecified: Secondary | ICD-10-CM | POA: Diagnosis not present

## 2017-05-02 DIAGNOSIS — I10 Essential (primary) hypertension: Secondary | ICD-10-CM

## 2017-05-02 LAB — LIPID PANEL
CHOL/HDL RATIO: 3
CHOLESTEROL: 164 mg/dL (ref 0–200)
HDL: 64.5 mg/dL (ref >39.00)
NonHDL: 99.9
Triglycerides: 212 mg/dL — ABNORMAL HIGH (ref 0.0–149.0)
VLDL: 42.4 mg/dL — AB (ref 0.0–40.0)

## 2017-05-02 LAB — COMPREHENSIVE METABOLIC PANEL
ALBUMIN: 4.3 g/dL (ref 3.5–5.2)
ALK PHOS: 84 U/L (ref 39–117)
ALT: 48 U/L (ref 0–53)
AST: 40 U/L — ABNORMAL HIGH (ref 0–37)
BUN: 14 mg/dL (ref 6–23)
CHLORIDE: 99 meq/L (ref 96–112)
CO2: 28 mEq/L (ref 19–32)
Calcium: 10.1 mg/dL (ref 8.4–10.5)
Creatinine, Ser: 0.97 mg/dL (ref 0.40–1.50)
GFR: 84.34 mL/min (ref >60.00)
Glucose, Bld: 98 mg/dL (ref 70–99)
POTASSIUM: 3.8 meq/L (ref 3.5–5.1)
SODIUM: 136 meq/L (ref 135–145)
Total Bilirubin: 1.6 mg/dL — ABNORMAL HIGH (ref 0.2–1.2)
Total Protein: 7.8 g/dL (ref 6.0–8.3)

## 2017-05-02 LAB — LDL CHOLESTEROL, DIRECT: Direct LDL: 88 mg/dL

## 2017-05-02 LAB — MAGNESIUM: MAGNESIUM: 2.2 mg/dL (ref 1.5–2.5)

## 2017-05-02 LAB — TSH: TSH: 1.04 u[IU]/mL (ref 0.35–4.50)

## 2017-05-02 MED ORDER — AMLODIPINE BESYLATE 10 MG PO TABS: 10.0000 mg | ORAL_TABLET | Freq: Every day | ORAL | 1 refills | Status: DC

## 2017-05-02 MED ORDER — METOPROLOL SUCCINATE ER 25 MG PO TB24
25.0000 mg | ORAL_TABLET | Freq: Every day | ORAL | 3 refills | Status: DC
Start: 2017-05-02 — End: 2018-05-11

## 2017-05-02 NOTE — Progress Notes (Signed)
Subjective:  Patient ID: Jonathan Simon, male    DOB: 1960-07-29  Age: 57 y.o. MRN: [id number]  CC: The primary encounter diagnosis was Palpitations. Diagnoses of Vasculitis ([place]), Hyperlipidemia LDL goal <100, Essential hypertension, Fluttering sensation of heart, Petechial rash, and Hyperlipidemia with target LDL less than 100 were also pertinent to this visit. HPI Nicoli THURLOW GALLAGA presents for follow up on hypertension   Hypertension: patient checks blood pressure twice weekly at home.  Readings have been for the most part > 140/80 at rest  And as high as  160/100 . Patient is following a reduced salt diet most days and is taking medications as prescribed.  He reports a history of weight gain since his last visit  ,  But has lost weight since jan 1 with a restricted calorie diet and regular exercise.  1) For the ast ten days he has been having recurrent episodes of racing/fluttering  heart lasting for several hours and accompanied by elevated BP (his wife is a CMA and has checked it)  .  During the episodes he feels light headed, dizzy without true vertigo, and feels presyncopal  but has not had syncope .Occur both at rest and with physical exertion  For the last 10 days,  Occurring  Daily . Aggravated by caffeine .  Has not used decongestants recently. Denies excessive alcohol use.Marland Kitchen  Has used his albuterol MDI but thinks that his  use of MDI was AFTER  Onset of symptoms.   2) He was  treated for a petechial burning rash on both lower legs recently by Urgent Care.  CRP was elevated.  ESR was normal,  Sent to dermatology.  Biopsy was done by [facility] last week  To rule out vasculitis,   n no results yet.  Prescribed topical Triamcinolone is helping    Outpatient Medications Prior to Visit  Medication Sig Dispense Refill  . albuterol (PROVENTIL HFA;VENTOLIN HFA) 108 (90 BASE) MCG/ACT inhaler Inhale 2 puffs into the lungs every 6 (six) hours as needed for wheezing. 1 Inhaler 0  . fluticasone  (VERAMYST) 27.5 MCG/SPRAY nasal spray Place 2 sprays into the nose as needed.     Marland Kitchen losartan (COZAAR) 50 MG tablet TAKE 1 TABLET BY MOUTH EVERY DAY. 30 tablet 3  . Multiple Vitamin (MULTIVITAMIN) capsule Take 1 capsule by mouth daily.    . niacin 500 MG tablet Take 500 mg by mouth daily with breakfast.      . simvastatin (ZOCOR) 20 MG tablet Take 1 tablet (20 mg total) by mouth daily at 6 PM. 45 tablet 5  . triamcinolone cream (KENALOG) 0.1 %     . amLODipine (NORVASC) 10 MG tablet TAKE 1 TABLET BY MOUTH EVERY DAY 30 tablet 5  . pseudoephedrine (SUDAFED) 30 MG tablet Take 30 mg by mouth every 6 (six) hours as needed.      Marland Kitchen losartan (COZAAR) 50 MG tablet TAKE 1 TABLET BY MOUTH EVERY DAY. (Patient not taking: Reported on 05/02/2017) 30 tablet 3  . losartan (COZAAR) 50 MG tablet TAKE 1 TABLET BY MOUTH EVERY DAY. (Patient not taking: Reported on 05/02/2017) 30 tablet 2   No facility-administered medications prior to visit.     Review of Systems;  Patient denies headache, fevers, malaise, unintentional weight loss, skin rash, eye pain, sinus congestion and sinus pain, sore throat, dysphagia,  hemoptysis , cough, dyspnea, wheezing,  orthopnea, edema, abdominal pain, nausea, melena, diarrhea, constipation, flank pain, dysuria, hematuria, urinary  Frequency, nocturia, numbness, tingling,  seizures,  Focal weakness, Loss of consciousness,  Tremor, insomnia, depression, anxiety, and suicidal ideation.      Objective:  BP (!) 144/78 (BP Location: Left Arm, Patient Position: Sitting, Cuff Size: Large)   Pulse 80   Temp 98.6 F (37 C) (Oral)   Resp 14   Ht '5\' 11"'$  (1.803 m)   Wt 216 lb 9.6 oz (98.2 kg)   SpO2 98%   BMI 30.21 kg/m   BP Readings from Last 3 Encounters:  05/02/17 (!) 144/78  10/25/16 128/86  06/25/16 119/77    Wt Readings from Last 3 Encounters:  05/02/17 216 lb 9.6 oz (98.2 kg)  10/25/16 212 lb (96.2 kg)  06/25/16 210 lb (95.3 kg)    General appearance: alert, cooperative and  appears stated age Ears: normal TM's and external ear canals both ears Throat: lips, mucosa, and tongue normal; teeth and gums normal Neck: no adenopathy, no carotid bruit, supple, symmetrical, trachea midline and thyroid not enlarged, symmetric, no tenderness/mass/nodules Back: symmetric, no curvature. ROM normal. No CVA tenderness. Lungs: clear to auscultation bilaterally Heart: regular rate and rhythm, S1, S2 normal, no murmur, click, rub or gallop Abdomen: soft, non-tender; bowel sounds normal; no masses,  no organomegaly Pulses: 2+ and symmetric Skin: fading petechial rash on lower extremiies Lymph nodes: Cervical, supraclavicular, and axillary nodes normal.  Lab Results  Component Value Date   HGBA1C 5.2 04/22/2016    Lab Results  Component Value Date   CREATININE 0.97 05/02/2017   CREATININE 1.00 10/25/2016   CREATININE 1.00 04/22/2016    Lab Results  Component Value Date   WBC 5.6 04/22/2016   HGB 15.8 04/22/2016   HCT 46.4 04/22/2016   PLT 218.0 04/22/2016   GLUCOSE 98 05/02/2017   CHOL 164 05/02/2017   TRIG 212.0 (H) 05/02/2017   HDL 64.50 05/02/2017   LDLDIRECT 88.0 05/02/2017   LDLCALC 89 04/22/2016   ALT 48 05/02/2017   AST 40 (H) 05/02/2017   NA 136 05/02/2017   K 3.8 05/02/2017   CL 99 05/02/2017   CREATININE 0.97 05/02/2017   BUN 14 05/02/2017   CO2 28 05/02/2017   TSH 1.04 05/02/2017   PSA 0.68 10/25/2016   HGBA1C 5.2 04/22/2016    No results found.  Assessment & Plan:   Problem List Items Addressed This Visit    Fluttering sensation of heart    Accompanied by light headedness,  Chest tightness.  EKG done today is reflective of NSR with normal intervals. Screen for thyroid,  Lytes normal.   Starting Toprol XL at 25 mg,  Referring to cardiology for ECHO, Holter monitor. Advised to abstain from use of albuterol MDI during episodes  Lab Results  Component Value Date   TSH 1.04 05/02/2017   Lab Results  Component Value Date   NA 136  05/02/2017   K 3.8 05/02/2017   CL 99 05/02/2017   CO2 28 05/02/2017         Hyperlipidemia with target LDL less than 100 (Chronic)    LDL is at goal on current medications. He has no side effect.  AST and triglycerides are elevated,  Suggesting possibel recidivism of alcohol abuse . No changes today  Lab Results  Component Value Date   CHOL 164 05/02/2017   HDL 64.50 05/02/2017   LDLCALC 89 04/22/2016   LDLDIRECT 88.0 05/02/2017   TRIG 212.0 (H) 05/02/2017   CHOLHDL 3 05/02/2017   Lab Results  Component Value Date   ALT 48 05/02/2017  AST 40 (H) 05/02/2017   ALKPHOS 84 05/02/2017   BILITOT 1.6 (H) 05/02/2017           Relevant Medications   metoprolol succinate (TOPROL-XL) 25 MG 24 hr tablet   amLODipine (NORVASC) 10 MG tablet   Hypertension (Chronic)    Adding Toprol XL given new onset palpitations concerning for PAF vs SVT      Relevant Medications   metoprolol succinate (TOPROL-XL) 25 MG 24 hr tablet   amLODipine (NORVASC) 10 MG tablet   Petechial rash    Urgent care evaluation last week resulted in tentative diagnosis of vasculitis pending skin biopsy due to elevated CRP.  ANA, Rh factor both negative.         Other Visit Diagnoses    Palpitations    -  Primary   Relevant Orders   EKG 12-Lead (Completed)   TSH (Completed)   Magnesium (Completed)   CK total and CKMB (cardiac)not at [facility]   Comprehensive metabolic panel (Completed)   Ambulatory referral to Cardiology   Vasculitis ([place])       Relevant Medications   metoprolol succinate (TOPROL-XL) 25 MG 24 hr tablet   amLODipine (NORVASC) 10 MG tablet   Other Relevant Orders   ANA w/Reflex if Positive (Completed)   Rheumatoid Factor (Completed)   Hyperlipidemia LDL goal <100       Relevant Medications   metoprolol succinate (TOPROL-XL) 25 MG 24 hr tablet   amLODipine (NORVASC) 10 MG tablet   Other Relevant Orders   Lipid panel (Completed)     A total of 40 minutes was spent with patient more  than half of which was spent in counseling patient on the above mentioned issues , reviewing and explaining recent labs and imaging studies done, and coordination of care.  I have discontinued Meryl J. Vea's pseudoephedrine. I have also changed his amLODipine. Additionally, I am having him start on metoprolol succinate. Lastly, I am having him maintain his fluticasone, niacin, albuterol, simvastatin, multivitamin, losartan, and triamcinolone cream.  Meds ordered this encounter  Medications  . metoprolol succinate (TOPROL-XL) 25 MG 24 hr tablet    Sig: Take 1 tablet (25 mg total) by mouth daily.    Dispense:  90 tablet    Refill:  3  . amLODipine (NORVASC) 10 MG tablet    Sig: Take 1 tablet (10 mg total) by mouth daily.    Dispense:  90 tablet    Refill:  1    Medications Discontinued During This Encounter  Medication Reason  . losartan (COZAAR) 50 MG tablet Duplicate  . losartan (COZAAR) 50 MG tablet Duplicate  . pseudoephedrine (SUDAFED) 30 MG tablet   . amLODipine (NORVASC) 10 MG tablet Reorder    Follow-up: No Follow-up on file.   Crecencio Mc, MD

## 2017-05-02 NOTE — Patient Instructions (Addendum)
I am starting you on a medication called Toprol XL  To take once daily for both blood pressure and heart rate control  Do NOT  Use the albuterol inhaler if your  Heart is racing or irregular,  It will make it worse   Referral to Cardiology is under way    You should try to keep your calories to 1500 mg daily to lose weight,  And eliminate as many unnecessary starches (bread, muffins, bagels,  Potatoes,  Rice and cereal)  As needed

## 2017-05-03 DIAGNOSIS — R233 Spontaneous ecchymoses: Secondary | ICD-10-CM | POA: Insufficient documentation

## 2017-05-03 DIAGNOSIS — R002 Palpitations: Secondary | ICD-10-CM | POA: Insufficient documentation

## 2017-05-03 LAB — CK TOTAL AND CKMB (NOT AT ARMC)
CK, MB: 0.9 ng/mL (ref 0–5.0)
RELATIVE INDEX: 1.3 (ref 0–4.0)
Total CK: 72 U/L (ref 44–196)

## 2017-05-03 LAB — ANA W/REFLEX IF POSITIVE: Anti Nuclear Antibody(ANA): NEGATIVE

## 2017-05-03 LAB — RHEUMATOID FACTOR: Rhuematoid fact SerPl-aCnc: <14 IU/mL (ref <14)

## 2017-05-03 NOTE — Assessment & Plan Note (Signed)
Adding Toprol XL given new onset palpitations concerning for PAF vs SVT

## 2017-05-03 NOTE — Assessment & Plan Note (Signed)
Urgent care evaluation last week resulted in tentative diagnosis of vasculitis pending skin biopsy due to elevated CRP.  ANA, Rh factor both negative.

## 2017-05-03 NOTE — Assessment & Plan Note (Addendum)
Accompanied by light headedness,  Chest tightness.  EKG done today is reflective of NSR with normal intervals. Screen for thyroid,  Lytes normal.   Starting Toprol XL at 25 mg,  Referring to cardiology for ECHO, Holter monitor. Advised to abstain from use of albuterol MDI during episodes  Lab Results  Component Value Date   TSH 1.04 05/02/2017   Lab Results  Component Value Date   NA 136 05/02/2017   K 3.8 05/02/2017   CL 99 05/02/2017   CO2 28 05/02/2017

## 2017-05-03 NOTE — Assessment & Plan Note (Signed)
LDL is at goal on current medications. He has no side effect.  AST and triglycerides are elevated,  Suggesting possibel recidivism of alcohol abuse . No changes today  Lab Results  Component Value Date   CHOL 164 05/02/2017   HDL 64.50 05/02/2017   LDLCALC 89 04/22/2016   LDLDIRECT 88.0 05/02/2017   TRIG 212.0 (H) 05/02/2017   CHOLHDL 3 05/02/2017   Lab Results  Component Value Date   ALT 48 05/02/2017   AST 40 (H) 05/02/2017   ALKPHOS 84 05/02/2017   BILITOT 1.6 (H) 05/02/2017

## 2017-05-04 ENCOUNTER — Other Ambulatory Visit: Payer: Self-pay | Admitting: Internal Medicine

## 2017-05-04 DIAGNOSIS — R740 Nonspecific elevation of levels of transaminase and lactic acid dehydrogenase [LDH]: Principal | ICD-10-CM

## 2017-05-04 DIAGNOSIS — R7401 Elevation of levels of liver transaminase levels: Secondary | ICD-10-CM

## 2017-05-05 ENCOUNTER — Ambulatory Visit: Payer: Self-pay | Admitting: *Deleted

## 2017-05-05 ENCOUNTER — Telehealth: Payer: Self-pay | Admitting: Internal Medicine

## 2017-05-05 NOTE — Telephone Encounter (Signed)
The patient has been scheduled with [place] for 3.13.19 @ 2:30. The patient is aware of this appointment.

## 2017-05-05 NOTE — Telephone Encounter (Signed)
Our call got disconnected before I could speak to him.

## 2017-05-05 NOTE — Telephone Encounter (Signed)
Copied from [place]. Topic: General - Other >> May 05, 2017  9:12 AM Cecelia Byars, NT wrote: Reason for CRM: Patient called to check the status of the referral for a  cardiologist  due to his having  high blood pressure ,

## 2017-05-11 ENCOUNTER — Telehealth: Payer: Self-pay | Admitting: Internal Medicine

## 2017-05-11 NOTE — Telephone Encounter (Signed)
EKG faxed to [facility]

## 2017-05-11 NOTE — Telephone Encounter (Signed)
Copied from [place] [phone]. Topic: Quick Communication - See Telephone Encounter >> May 11, 2017  8:14 AM Conception Chancy, NT wrote: CRM for notification. See Telephone encounter for:  05/11/17.  Torius is calling from [facility] and states the patient has an appt 05/11/17 at 9:30am. She is unable to pull up the EKG and is needing the order faxed to her. Please advise.   [phone] Fax

## 2017-06-18 ENCOUNTER — Other Ambulatory Visit: Payer: Self-pay | Admitting: Internal Medicine

## 2017-06-20 ENCOUNTER — Ambulatory Visit: Payer: 59 | Admitting: Cardiovascular Disease

## 2017-07-05 ENCOUNTER — Other Ambulatory Visit: Payer: Self-pay | Admitting: Internal Medicine

## 2017-11-07 ENCOUNTER — Other Ambulatory Visit: Payer: Self-pay | Admitting: Internal Medicine

## 2018-01-02 ENCOUNTER — Other Ambulatory Visit: Payer: Self-pay | Admitting: Internal Medicine

## 2018-05-11 ENCOUNTER — Other Ambulatory Visit: Payer: Self-pay | Admitting: Internal Medicine

## 2018-05-19 ENCOUNTER — Other Ambulatory Visit: Payer: Self-pay | Admitting: Internal Medicine

## 2018-05-19 NOTE — Telephone Encounter (Signed)
Last OV 05/02/2017   Last refilled 11/08/2017 disp 90 with 1 refill   Next appt none scheduled   Sent to PCP for approval

## 2018-07-01 ENCOUNTER — Other Ambulatory Visit: Payer: Self-pay | Admitting: Internal Medicine

## 2018-07-01 DIAGNOSIS — Z125 Encounter for screening for malignant neoplasm of prostate: Secondary | ICD-10-CM

## 2018-07-01 DIAGNOSIS — E785 Hyperlipidemia, unspecified: Secondary | ICD-10-CM

## 2018-07-01 DIAGNOSIS — I10 Essential (primary) hypertension: Secondary | ICD-10-CM

## 2018-07-03 NOTE — Telephone Encounter (Signed)
Refilled: 06/20/2017 Last OV: 05/02/2017 Next OV: not scheduled

## 2018-07-04 ENCOUNTER — Other Ambulatory Visit: Payer: Self-pay | Admitting: Internal Medicine

## 2018-07-04 NOTE — Telephone Encounter (Signed)
Spoke with pt about why his medication was not sent. Please see message on the medication refill.

## 2018-07-04 NOTE — Telephone Encounter (Signed)
Copied from [place] [id number]. Topic: Quick Communication - Rx Refill/Question >> Jul 04, 2018  3:17 PM Jonathan Simon wrote: Medication: simvastatin (ZOCOR) 20 MG tablet [[id number]]   Has the patient contacted their pharmacy? Yes.   (Agent: If yes, when and what did the pharmacy advise?) The patient stated that the pharmacy told him that his medication request was denied. He does not know why. Please call the patient back to advise him on what to do about this medication.   Preferred Pharmacy (with phone number or street name): [place], [place] - [place] [phone] (Phone) [phone] (Fax)    Agent: Please be advised that RX refills may take up to 3 business days. We ask that you follow-up with your pharmacy.

## 2018-07-04 NOTE — Telephone Encounter (Signed)
Spoke with pt and informed him of why his medication was not sent it. Pt has been scheduled for a fasting lab appt tomorrow and a physical in July. Pt is aware of both appts and times.

## 2018-07-04 NOTE — Telephone Encounter (Signed)
No refills on simvastatin until he has fasting labs done.  All necessary  labs have been ordered . Also needs a virtual visit set up in near future ; needs to check BP at pharmacy and bring reading to visit

## 2018-07-05 ENCOUNTER — Other Ambulatory Visit: Payer: 59

## 2018-07-06 NOTE — Telephone Encounter (Signed)
Copied from [place] [id number]. Topic: Appointment Scheduling - Prior Auth Required for Appointment >> Jul 06, 2018  7:57 AM Jonathan Simon, [name] wrote: No appointment has been scheduled. Patient is requesting lab appointment for med refill. Per scheduling protocol, this appointment requires a prior authorization prior to scheduling.  Route to department's PEC pool.

## 2018-07-06 NOTE — Telephone Encounter (Signed)
Santiago Glad this pt wants a lab appt but this is what the CRM says: a prior authorization must be done for an appointment, I don't know what this means.  Can you help?  Zeriah Baysinger,cma

## 2018-07-07 ENCOUNTER — Telehealth: Payer: Self-pay | Admitting: *Deleted

## 2018-07-07 ENCOUNTER — Other Ambulatory Visit: Payer: Self-pay

## 2018-07-07 ENCOUNTER — Telehealth: Payer: Self-pay

## 2018-07-07 ENCOUNTER — Encounter: Payer: Self-pay | Admitting: *Deleted

## 2018-07-07 ENCOUNTER — Other Ambulatory Visit (INDEPENDENT_AMBULATORY_CARE_PROVIDER_SITE_OTHER): Payer: 59

## 2018-07-07 ENCOUNTER — Other Ambulatory Visit: Payer: Self-pay | Admitting: *Deleted

## 2018-07-07 ENCOUNTER — Other Ambulatory Visit: Payer: Self-pay | Admitting: Internal Medicine

## 2018-07-07 DIAGNOSIS — I10 Essential (primary) hypertension: Secondary | ICD-10-CM

## 2018-07-07 DIAGNOSIS — E785 Hyperlipidemia, unspecified: Secondary | ICD-10-CM | POA: Diagnosis not present

## 2018-07-07 DIAGNOSIS — Z125 Encounter for screening for malignant neoplasm of prostate: Secondary | ICD-10-CM

## 2018-07-07 LAB — COMPREHENSIVE METABOLIC PANEL
ALT: 38 U/L (ref 0–53)
AST: 38 U/L — ABNORMAL HIGH (ref 0–37)
Albumin: 4.7 g/dL (ref 3.5–5.2)
Alkaline Phosphatase: 90 U/L (ref 39–117)
BUN: 10 mg/dL (ref 6–23)
CO2: 28 mEq/L (ref 19–32)
Calcium: 9.8 mg/dL (ref 8.4–10.5)
Chloride: 100 mEq/L (ref 96–112)
Creatinine, Ser: 0.86 mg/dL (ref 0.40–1.50)
GFR: 90.8 mL/min (ref >60.00)
Glucose, Bld: 103 mg/dL — ABNORMAL HIGH (ref 70–99)
Potassium: 4.4 mEq/L (ref 3.5–5.1)
Sodium: 138 mEq/L (ref 135–145)
Total Bilirubin: 2.2 mg/dL — ABNORMAL HIGH (ref 0.2–1.2)
Total Protein: 7.6 g/dL (ref 6.0–8.3)

## 2018-07-07 LAB — LIPID PANEL
Cholesterol: 164 mg/dL (ref 0–200)
HDL: 54 mg/dL (ref >39.00)
LDL Cholesterol: 75 mg/dL (ref 0–99)
NonHDL: 109.62
Total CHOL/HDL Ratio: 3
Triglycerides: 171 mg/dL — ABNORMAL HIGH (ref 0.0–149.0)
VLDL: 34.2 mg/dL (ref 0.0–40.0)

## 2018-07-07 LAB — BILIRUBIN, DIRECT: Bilirubin, Direct: 0.4 mg/dL — ABNORMAL HIGH (ref 0.0–0.3)

## 2018-07-07 LAB — PSA: PSA: 0.79 ng/mL (ref 0.10–4.00)

## 2018-07-07 MED ORDER — SIMVASTATIN 20 MG PO TABS: ORAL_TABLET | ORAL | 0 refills | Status: DC

## 2018-07-07 NOTE — Telephone Encounter (Signed)
Copied from [place] [phone]. Topic: Appointment Scheduling - Prior Auth Required for Appointment >> Jul 06, 2018  7:57 AM Robina Ade, [name] wrote: No appointment has been scheduled. Patient is requesting lab appointment for med refill. Per scheduling protocol, this appointment requires a prior authorization prior to scheduling.  Route to department's PEC pool.  Patient was told to go to the lab and he has an appt today for labs, he needs his simvastatin sent to his pharmacy, he is out I did not fill it because I wasn't sure if the labs was needed first or not.  Ahava Kissoon,cma

## 2018-07-07 NOTE — Telephone Encounter (Signed)
Pt came in for labs this morning & requested a refill on his Simvastatin. He uses [facility]. In reviewing his chart after he left, he hasn't been seen since 04/2017.  I am sending in a 30 day supply after talking with [facility] & letting the patient know he will need an appt for further refills.

## 2018-07-07 NOTE — Telephone Encounter (Signed)
Copied from [place] [id number]. Topic: Appointment Scheduling - Prior Auth Required for Appointment >> Jul 06, 2018  7:57 AM Jonathan Simon, [name] wrote: No appointment has been scheduled. Patient is requesting lab appointment for med refill. Per scheduling protocol, this appointment requires a prior authorization prior to scheduling.  Route to department's PEC pool.

## 2018-07-07 NOTE — Telephone Encounter (Signed)
Patient had labs drawn this morning. He has not seen Dr Derrel Nip in over a year.

## 2018-07-07 NOTE — Telephone Encounter (Signed)
Copied from [place] [id number]. Topic: Appointment Scheduling - Prior Auth Required for Appointment >> Jul 06, 2018  7:57 AM Jonathan Simon, [name] wrote: No appointment has been scheduled. Patient is requesting lab appointment for med refill. Per scheduling protocol, this appointment requires a prior authorization prior to scheduling.  Route to department's PEC pool.

## 2018-07-07 NOTE — Progress Notes (Signed)
Direct bilirubin add on order.

## 2018-07-08 NOTE — Telephone Encounter (Signed)
Per review, LaToya sent in a 30 day supply for pt and drew labs this am.  She sent a message to Dr Derrel Nip.  Will review labs.

## 2018-07-13 ENCOUNTER — Other Ambulatory Visit: Payer: Self-pay | Admitting: Internal Medicine

## 2018-07-27 ENCOUNTER — Other Ambulatory Visit: Payer: Self-pay

## 2018-07-27 ENCOUNTER — Ambulatory Visit (INDEPENDENT_AMBULATORY_CARE_PROVIDER_SITE_OTHER): Payer: 59 | Admitting: Internal Medicine

## 2018-07-27 ENCOUNTER — Encounter: Payer: Self-pay | Admitting: Internal Medicine

## 2018-07-27 DIAGNOSIS — F1010 Alcohol abuse, uncomplicated: Secondary | ICD-10-CM | POA: Diagnosis not present

## 2018-07-27 DIAGNOSIS — I10 Essential (primary) hypertension: Secondary | ICD-10-CM

## 2018-07-27 DIAGNOSIS — E785 Hyperlipidemia, unspecified: Secondary | ICD-10-CM | POA: Diagnosis not present

## 2018-07-27 DIAGNOSIS — K700 Alcoholic fatty liver: Secondary | ICD-10-CM

## 2018-07-27 DIAGNOSIS — Z7189 Other specified counseling: Secondary | ICD-10-CM
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
## 2018-07-27 NOTE — Progress Notes (Signed)
Nurse visit and lab appts have been scheduled. Pt is aware of both appts dates and times.

## 2018-07-27 NOTE — Progress Notes (Signed)
Pt needs a refill of the triamcinolone cream

## 2018-07-29 ENCOUNTER — Encounter: Payer: Self-pay | Admitting: Internal Medicine

## 2018-07-29 DIAGNOSIS — Z7189 Other specified counseling: Secondary | ICD-10-CM | POA: Insufficient documentation

## 2018-07-29 HISTORY — DX: Other specified counseling: Z71.89

## 2018-07-29 MED ORDER — LOSARTAN POTASSIUM 100 MG PO TABS: 100.0000 mg | ORAL_TABLET | Freq: Every day | ORAL | 1 refills | Status: DC

## 2018-07-29 NOTE — Assessment & Plan Note (Addendum)
Not at goal  On 10 mg amlodipine, 25 mg Toprol XL  and 50 mg losartan  Advised to increase losartan dose to 100 mg daily.   . Lab Results  Component Value Date   CREATININE 0.86 07/07/2018   Lab Results  Component Value Date   NA 138 07/07/2018   K 4.4 07/07/2018   CL 100 07/07/2018   CO2 28 07/07/2018  .

## 2018-07-29 NOTE — Assessment & Plan Note (Signed)
In the past he had request help and Antabuse trial was recommended for Friday, Saturday and Sunday use.  He has been sober for the past 2 weeks

## 2018-07-29 NOTE — Assessment & Plan Note (Signed)

## 2018-07-29 NOTE — Assessment & Plan Note (Signed)
LDL is at goal on simvastatin 20mg  daily . Marland Kitchen  AST and triglycerides are elevated,  Secondary to recidivism of alcohol abuse, which has been addressed  . No changes today  Lab Results  Component Value Date   CHOL 164 07/07/2018   HDL 54.00 07/07/2018   LDLCALC 75 07/07/2018   LDLDIRECT 88.0 05/02/2017   TRIG 171.0 (H) 07/07/2018   CHOLHDL 3 07/07/2018   Lab Results  Component Value Date   ALT 38 07/07/2018   AST 38 (H) 07/07/2018   ALKPHOS 90 07/07/2018   BILITOT 2.2 (H) 07/07/2018

## 2018-08-03 ENCOUNTER — Other Ambulatory Visit (INDEPENDENT_AMBULATORY_CARE_PROVIDER_SITE_OTHER): Payer: 59

## 2018-08-03 ENCOUNTER — Ambulatory Visit (INDEPENDENT_AMBULATORY_CARE_PROVIDER_SITE_OTHER): Payer: 59

## 2018-08-03 ENCOUNTER — Other Ambulatory Visit: Payer: Self-pay

## 2018-08-03 VITALS — BP 130/80

## 2018-08-03 DIAGNOSIS — Z23 Encounter for immunization: Secondary | ICD-10-CM | POA: Diagnosis not present

## 2018-08-03 DIAGNOSIS — K700 Alcoholic fatty liver: Secondary | ICD-10-CM

## 2018-08-03 LAB — HEPATIC FUNCTION PANEL
ALT: 30 U/L (ref 0–53)
AST: 24 U/L (ref 0–37)
Albumin: 4.3 g/dL (ref 3.5–5.2)
Alkaline Phosphatase: 64 U/L (ref 39–117)
Bilirubin, Direct: 0.2 mg/dL (ref 0.0–0.3)
Total Bilirubin: 1 mg/dL (ref 0.2–1.2)
Total Protein: 6.9 g/dL (ref 6.0–8.3)

## 2018-08-03 LAB — BILIRUBIN, DIRECT: Bilirubin, Direct: 0.2 mg/dL (ref 0.0–0.3)

## 2018-08-03 NOTE — Progress Notes (Signed)
Patient here for nurse visit BP check per order from Dr. Derrel Nip.   Patient reports compliance with prescribed BP medications: yes  Last dose of BP medication: 7:30 am   BP Readings from Last 3 Encounters:  08/03/18 130/80  05/02/17 (!) 144/78  10/25/16 128/86   Pulse Readings from Last 3 Encounters:  05/02/17 80  10/25/16 62  06/25/16 (!) 54    Per Janett Billow; Keep the current regimen.    Patient verbalized understanding of instructions.   Gordy Councilman, CMA

## 2018-08-14 ENCOUNTER — Other Ambulatory Visit: Payer: Self-pay | Admitting: Internal Medicine

## 2018-08-21 ENCOUNTER — Other Ambulatory Visit: Payer: Self-pay | Admitting: Internal Medicine

## 2018-09-11 ENCOUNTER — Telehealth: Payer: Self-pay | Admitting: Internal Medicine

## 2018-09-11 NOTE — Telephone Encounter (Signed)
Pt was scheduled this Thursday to have a cpe at 8am. Pt can not come into the office in afternoon because he works 2pm to 11pm, Monday-Friday. Pt had a virtual on 07/27/2018 for a  Medication refill. Does pt still need to have a virtual with [place]?

## 2018-09-11 NOTE — Telephone Encounter (Signed)
If his insurance requires a CPE you can push it out to an available  Tuesday at 1:00 PM ,  But if they do not require it  He does not need one since he has his PSA done and he is up to date on colonoscopy/colon cancer screening

## 2018-09-12 ENCOUNTER — Ambulatory Visit (INDEPENDENT_AMBULATORY_CARE_PROVIDER_SITE_OTHER): Payer: 59

## 2018-09-12 ENCOUNTER — Ambulatory Visit: Payer: 59

## 2018-09-12 ENCOUNTER — Other Ambulatory Visit: Payer: Self-pay

## 2018-09-12 DIAGNOSIS — Z23 Encounter for immunization: Secondary | ICD-10-CM

## 2018-09-12 NOTE — Telephone Encounter (Signed)
Pt was in for a nurse visit. His CPE has been rescheduled to November at 1pm.

## 2018-09-14 ENCOUNTER — Ambulatory Visit: Payer: 59

## 2018-09-14 ENCOUNTER — Encounter: Payer: 59 | Admitting: Internal Medicine

## 2018-11-06 ENCOUNTER — Other Ambulatory Visit: Payer: Self-pay | Admitting: Internal Medicine

## 2018-11-07 NOTE — Telephone Encounter (Signed)
Pt calling to check on this.  States that he is completely out of medication.

## 2018-11-15 ENCOUNTER — Other Ambulatory Visit: Payer: Self-pay | Admitting: Internal Medicine

## 2018-11-21 ENCOUNTER — Other Ambulatory Visit: Payer: Self-pay

## 2018-11-21 ENCOUNTER — Telehealth: Payer: Self-pay | Admitting: Internal Medicine

## 2018-11-21 MED ORDER — AMLODIPINE BESYLATE 10 MG PO TABS: 10.0000 mg | ORAL_TABLET | Freq: Every day | ORAL | 0 refills | Status: DC

## 2018-11-21 NOTE — Telephone Encounter (Signed)
Medication refill: amLODipine (NORVASC) 10 MG tablet [id number]   Pharmacy:  [facility], [place] - [address] [place] [phone] (Phone) [phone] (Fax)

## 2018-11-21 NOTE — Telephone Encounter (Signed)
Medication sent to patient's pharmacy.

## 2018-11-24 ENCOUNTER — Other Ambulatory Visit: Payer: Self-pay

## 2018-11-28 ENCOUNTER — Other Ambulatory Visit: Payer: Self-pay

## 2018-11-28 ENCOUNTER — Ambulatory Visit (INDEPENDENT_AMBULATORY_CARE_PROVIDER_SITE_OTHER): Payer: 59 | Admitting: Internal Medicine

## 2018-11-28 ENCOUNTER — Encounter: Payer: Self-pay | Admitting: Internal Medicine

## 2018-11-28 VITALS — BP 128/92 | HR 61 | Temp 97.9°F | Resp 14 | Ht 71.0 in | Wt 217.6 lb

## 2018-11-28 DIAGNOSIS — I10 Essential (primary) hypertension: Secondary | ICD-10-CM | POA: Diagnosis not present

## 2018-11-28 DIAGNOSIS — Z0000 Encounter for general adult medical examination without abnormal findings: Secondary | ICD-10-CM

## 2018-11-28 DIAGNOSIS — Z79899 Other long term (current) drug therapy: Secondary | ICD-10-CM

## 2018-11-28 DIAGNOSIS — E785 Hyperlipidemia, unspecified: Secondary | ICD-10-CM

## 2018-11-28 DIAGNOSIS — I776 Arteritis, unspecified: Secondary | ICD-10-CM | POA: Insufficient documentation

## 2018-11-28 DIAGNOSIS — E663 Overweight: Secondary | ICD-10-CM | POA: Diagnosis not present

## 2018-11-28 MED ORDER — ZOSTER VAC RECOMB ADJUVANTED 50 MCG/0.5ML IM SUSR: 0.5000 mL | Freq: Once | INTRAMUSCULAR | 1 refills | Status: AC

## 2018-11-28 MED ORDER — SIMVASTATIN 20 MG PO TABS: ORAL_TABLET | ORAL | 1 refills | Status: DC

## 2018-11-28 MED ORDER — LOSARTAN POTASSIUM 100 MG PO TABS: 100.0000 mg | ORAL_TABLET | Freq: Every day | ORAL | 1 refills | Status: DC

## 2018-11-28 MED ORDER — HYDROCHLOROTHIAZIDE 25 MG PO TABS: 25.0000 mg | ORAL_TABLET | Freq: Every day | ORAL | 3 refills | Status: DC

## 2018-11-28 NOTE — Assessment & Plan Note (Signed)
I have addressed  BMI and recommended a low glycemic index diet utilizing smaller more frequent meals to increase metabolism.  I have also recommended that patient start exercising with a goal of 30 minutes of aerobic exercise a minimum of 5 days per week.  

## 2018-11-28 NOTE — Progress Notes (Signed)
Patient ID: LEMON VIEW, male    DOB: Mar 24, 1960  Age: 58 y.o. MRN: [id number]  The patient is here for annual preventive examination and management of other chronic and acute problems.   The risk factors are reflected in the social history.  The roster of all physicians providing medical care to patient - is listed in the Snapshot section of the chart.  Activities of daily living:  The patient is 100% independent in all ADLs: dressing, toileting, feeding as well as independent mobility  Home safety : The patient has smoke detectors in the home. They wear seatbelts.  There are no firearms at home. There is no violence in the home.   There is no risks for hepatitis, STDs or HIV. There is no   history of blood transfusion. They have no travel history to infectious disease endemic areas of the world.  The patient has seen their dentist in the last six month. They have seen their eye doctor in the last year. They admit to slight hearing difficulty with regard to whispered voices and some television programs.  They have deferred audiologic testing in the last year.  They do not  have excessive sun exposure. Discussed the need for sun protection: hats, long sleeves and use of sunscreen if there is significant sun exposure.   Diet: the importance of a healthy diet is discussed. They do have a healthy diet.  The benefits of regular aerobic exercise were discussed. She walks 4 times per week ,  20 minutes.   Depression screen: there are no signs or vegative symptoms of depression- irritability, change in appetite, anhedonia, sadness/tearfullness.  Cognitive assessment: the patient manages all their financial and personal affairs and is actively engaged. They could relate day,date,year and events; recalled 2/3 objects at 3 minutes; performed clock-face test normally.  The following portions of the patient's history were reviewed and updated as appropriate: allergies, current medications, past  family history, past medical history,  past surgical history, past social history  and problem list.  Visual acuity was not assessed per patient preference since she has regular follow up with her ophthalmologist. Hearing and body mass index were assessed and reviewed.   During the course of the visit the patient was educated and counseled about appropriate screening and preventive services including : fall prevention , diabetes screening, nutrition counseling, colorectal cancer screening, and recommended immunizations.    CC: The primary encounter diagnosis was Long-term use of high-risk medication. Diagnoses of Encounter for preventive health examination, Essential hypertension, Hyperlipidemia with target LDL less than 100, and Overweight were also pertinent to this visit.   1)  Hypertension: patient checks blood pressure twice weekly at home.  Readings have been for the most part > 130/90 at rest . Patient is following a reduced salt diet most days .  He has not increased losartan to 100 mg daily as advised in May 2020 visit but is taking 50 mg daily along with amlodipine 10 mg and Toprol XL 25 mg .  He snores per wife and she reports apneic spells.  But he denies morning fatigue and daytime hypersomnolence. Prior sleep study was negative 10 years ago .    2) The patient has no signs or symptoms of COVID 19 infection (fever, cough, sore throat  or shortness of breath beyond what is typical for patient).  Patient denies contact with other persons with the above mentioned symptoms or with anyone confirmed to have COVID 19   History Jobani has a  past medical history of Acute maxillary sinusitis, Acute upper respiratory infections of unspecified site, Edema, Esophageal reflux, Essential hypertension, benign, External hemorrhoids without mention of complication, Other and unspecified hyperlipidemia, Other malaise and fatigue, Spontaneous pneumothorax, and Treadmill stress test negative for angina  pectoris (2006).   He has a past surgical history that includes Talc pleurodesis; Inguinal hernia repair; laparatomy; Hemorrhoid surgery; and Colonoscopy with propofol (N/A, 06/25/2016).   His family history includes COPD (age of onset: 38) in his mother; Heart disease in his paternal grandfather; Heart disease (age of onset: 42) in his father.He reports that he has never smoked. He has never used smokeless tobacco. He reports current alcohol use of about 42.0 standard drinks of alcohol per week. He reports that he does not use drugs.  Outpatient Medications Prior to Visit  Medication Sig Dispense Refill  . albuterol (PROVENTIL HFA;VENTOLIN HFA) 108 (90 BASE) MCG/ACT inhaler Inhale 2 puffs into the lungs every 6 (six) hours as needed for wheezing. 1 Inhaler 0  . amLODipine (NORVASC) 10 MG tablet Take 1 tablet (10 mg total) by mouth daily. 90 tablet 0  . fluticasone (VERAMYST) 27.5 MCG/SPRAY nasal spray Place 2 sprays into the nose as needed.     . metoprolol succinate (TOPROL-XL) 25 MG 24 hr tablet TAKE 1 TABLET BY MOUTH DAILY 90 tablet 0  . Multiple Vitamin (MULTIVITAMIN) capsule Take 1 capsule by mouth daily.    . niacin 500 MG tablet Take 500 mg by mouth daily with breakfast.      . triamcinolone cream (KENALOG) 0.1 %     . losartan (COZAAR) 50 MG tablet TAKE 1 TABLET BY MOUTH DAILY 90 tablet 1  . simvastatin (ZOCOR) 20 MG tablet TAKE 1 TABLET BY MOUTH DAILY AT 6 PM 30 tablet 0  . losartan (COZAAR) 100 MG tablet Take 1 tablet (100 mg total) by mouth daily. (Patient not taking: Reported on 11/28/2018) 90 tablet 1   No facility-administered medications prior to visit.     Review of Systems   Patient denies headache, fevers, malaise, unintentional weight loss, skin rash, eye pain, sinus congestion and sinus pain, sore throat, dysphagia,  hemoptysis , cough, dyspnea, wheezing, chest pain, palpitations, orthopnea, edema, abdominal pain, nausea, melena, diarrhea, constipation, flank pain, dysuria,  hematuria, urinary  Frequency, nocturia, numbness, tingling, seizures,  Focal weakness, Loss of consciousness,  Tremor, insomnia, depression, anxiety, and suicidal ideation.     Objective:  BP (!) 128/92 (BP Location: Left Arm, Patient Position: Sitting, Cuff Size: Normal)   Pulse 61   Temp 97.9 F (36.6 C) (Temporal)   Resp 14   Ht 5\' 11"  (1.803 m)   Wt 217 lb 9.6 oz (98.7 kg)   SpO2 96%   BMI 30.35 kg/m   Physical Exam  General appearance: alert, cooperative and appears stated age Ears: normal TM's and external ear canals both ears Throat: lips, mucosa, and tongue normal; teeth and gums normal Neck: no adenopathy, no carotid bruit, supple, symmetrical, trachea midline and thyroid not enlarged, symmetric, no tenderness/mass/nodules Back: symmetric, no curvature. ROM normal. No CVA tenderness. Lungs: clear to auscultation bilaterally Heart: regular rate and rhythm, S1, S2 normal, no murmur, click, rub or gallop Abdomen: soft, non-tender; bowel sounds normal; no masses,  no organomegaly Pulses: 2+ and symmetric Skin: Skin color, texture, turgor normal. No rashes or lesions Lymph nodes: Cervical, supraclavicular, and axillary nodes normal.   Assessment & Plan:   Problem List Items Addressed This Visit      Unprioritized  Hypertension (Chronic)    Not at goal.  Patient did not increase losartan dose in May to 100 mg .  Medication refilled today.  Need to consider secondary causes including RAS and OSA       Relevant Medications   simvastatin (ZOCOR) 20 MG tablet   losartan (COZAAR) 100 MG tablet   Hyperlipidemia with target LDL less than 100 (Chronic)    LDL has been at  goal on simvastatin 20mg  daily . Marland Kitchen  AST and triglycerides have  Been elevated,  Secondary to recidivism of alcohol abuse, which has been addressed  . No changes today  Lab Results  Component Value Date   CHOL 164 07/07/2018   HDL 54.00 07/07/2018   LDLCALC 75 07/07/2018   LDLDIRECT 88.0 05/02/2017    TRIG 171.0 (H) 07/07/2018   CHOLHDL 3 07/07/2018   Lab Results  Component Value Date   ALT 30 08/03/2018   AST 24 08/03/2018   ALKPHOS 64 08/03/2018   BILITOT 1.0 08/03/2018           Relevant Medications   simvastatin (ZOCOR) 20 MG tablet   losartan (COZAAR) 100 MG tablet   Encounter for preventive health examination    age appropriate education and counseling updated, referrals for preventative services and immunizations addressed, dietary and smoking counseling addressed, most recent labs reviewed.  I have personally reviewed and have noted:  1) the patient's medical and social history 2) The pt's use of alcohol, tobacco, and illicit drugs 3) The patient's current medications and supplements 4) Functional ability including ADL's, fall risk, home safety risk, hearing and visual impairment 5) Diet and physical activities 6) Evidence for depression or mood disorder 7) The patient's height, weight, and BMI have been recorded in the chart  I have made referrals, and provided counseling and education based on review of the above      Overweight    I have addressed  BMI and recommended a low glycemic index diet utilizing smaller more frequent meals to increase metabolism.  I have also recommended that patient start exercising with a goal of 30 minutes of aerobic exercise a minimum of 5 days per week.        Other Visit Diagnoses    Long-term use of high-risk medication    -  Primary   Relevant Orders   Comprehensive metabolic panel      I have discontinued Kijana J. Rochon's hydrochlorothiazide. I am also having him start on Zoster Vaccine Adjuvanted. Additionally, I am having him maintain his fluticasone, niacin, albuterol, multivitamin, triamcinolone cream, metoprolol succinate, amLODipine, simvastatin, and losartan.  Meds ordered this encounter  Medications  . DISCONTD: hydrochlorothiazide (HYDRODIURIL) 25 MG tablet    Sig: Take 1 tablet (25 mg total) by mouth  daily.    Dispense:  90 tablet    Refill:  3  . simvastatin (ZOCOR) 20 MG tablet    Sig: TAKE 1 TABLET BY MOUTH DAILY AT 6 PM    Dispense:  90 tablet    Refill:  1  . Zoster Vaccine Adjuvanted [facility]) injection    Sig: Inject 0.5 mLs into the muscle once for 1 dose.    Dispense:  1 each    Refill:  1  . losartan (COZAAR) 100 MG tablet    Sig: Take 1 tablet (100 mg total) by mouth daily.    Dispense:  90 tablet    Refill:  1    Medications Discontinued During This Encounter  Medication Reason  .  simvastatin (ZOCOR) 20 MG tablet Reorder  . losartan (COZAAR) 50 MG tablet   . hydrochlorothiazide (HYDRODIURIL) 25 MG tablet   . losartan (COZAAR) 100 MG tablet Reorder    Follow-up: No follow-ups on file.   Crecencio Mc, MD

## 2018-11-28 NOTE — Assessment & Plan Note (Signed)

## 2018-11-28 NOTE — Assessment & Plan Note (Signed)
LDL has been at  goal on simvastatin 20mg  daily . Marland Kitchen  AST and triglycerides have  Been elevated,  Secondary to recidivism of alcohol abuse, which has been addressed  . No changes today  Lab Results  Component Value Date   CHOL 164 07/07/2018   HDL 54.00 07/07/2018   LDLCALC 75 07/07/2018   LDLDIRECT 88.0 05/02/2017   TRIG 171.0 (H) 07/07/2018   CHOLHDL 3 07/07/2018   Lab Results  Component Value Date   ALT 30 08/03/2018   AST 24 08/03/2018   ALKPHOS 64 08/03/2018   BILITOT 1.0 08/03/2018

## 2018-11-28 NOTE — Assessment & Plan Note (Signed)
Not at goal.  Patient did not increase losartan dose in May to 100 mg .  Medication refilled today.  Need to consider secondary causes including RAS and OSA

## 2018-11-28 NOTE — Patient Instructions (Signed)
INCREASE YOUR LOSARTAN DOSE TO 100 MG DAILY  RETURN FOR BP CHECK IN 2 WEEKS  IF STILL HIGH,  I RECOMMEND GETTING A SLEEP STUDY   Make your kids watch "The Social Dilemma"     The ShingRx vaccine is now available in local pharmacies and is much more protective than the old one  Zostavax  (it is about 97%  Effective in preventing shingles). .   It is therefore ADVISED for all interested adults over 50 to prevent shingles so I have printed you a prescription for it.  (it requires a 2nd dose 2 to 6 months after the first one) .  It will cause you to have flu  like symptoms for 2 days If your pharmacy is not available to give it to you,  The [facility] pharmacy will give it to you.  They are located on the ground floor of the [place] Adopting a healthy lifestyle and getting preventive care are important in promoting health and wellness. Ask your health care provider about:  The right schedule for you to have regular tests and exams.  Things you can do on your own to prevent diseases and keep yourself healthy. What should I know about diet, weight, and exercise? Eat a healthy diet   Eat a diet that includes plenty of vegetables, fruits, low-fat dairy products, and lean protein.  Do not eat a lot of foods that are high in solid fats, added sugars, or sodium. Maintain a healthy weight Body mass index (BMI) is a measurement that can be used to identify possible weight problems. It estimates body fat based on height and weight. Your health care provider can help determine your BMI and help you achieve or maintain a healthy weight. Get regular exercise Get regular exercise. This is one of the most important things you can do for your health. Most adults should:  Exercise for at least 150 minutes each week. The exercise should increase your heart rate and make you sweat (moderate-intensity exercise).  Do strengthening exercises at least  twice a week. This is in addition to the moderate-intensity exercise.  Spend less time sitting. Even light physical activity can be beneficial. Watch cholesterol and blood lipids Have your blood tested for lipids and cholesterol at 60 years of age, then have this test every 5 years. You may need to have your cholesterol levels checked more often if:  Your lipid or cholesterol levels are high.  You are older than 60 years of age.  You are at high risk for heart disease. What should I know about cancer screening? Many types of cancers can be detected early and may often be prevented. Depending on your health history and family history, you may need to have cancer screening at various ages. This may include screening for:  Colorectal cancer.  Prostate cancer.  Skin cancer.  Lung cancer. What should I know about heart disease, diabetes, and high blood pressure? Blood pressure and heart disease  High blood pressure causes heart disease and increases the risk of stroke. This is more likely to develop in people who have high blood pressure readings, are of African descent, or are overweight.  Talk with your health care provider about your target blood pressure readings.  Have your blood pressure checked: ? Every 3-5 years if you are 60-17 years of age. ? Every year if you are 66 years old or older.  If you are between the ages  of 25 and 75 and are a current or former smoker, ask your health care provider if you should have a one-time screening for abdominal aortic aneurysm (AAA). Diabetes Have regular diabetes screenings. This checks your fasting blood sugar level. Have the screening done:  Once every three years after age 79 if you are at a normal weight and have a low risk for diabetes.  More often and at a younger age if you are overweight or have a high risk for diabetes. What should I know about preventing infection? Hepatitis B If you have a higher risk for hepatitis B, you  should be screened for this virus. Talk with your health care provider to find out if you are at risk for hepatitis B infection. Hepatitis C Blood testing is recommended for:  Everyone born from [birth year] through 1967.  Anyone with known risk factors for hepatitis C. Sexually transmitted infections (STIs)  You should be screened each year for STIs, including gonorrhea and chlamydia, if: ? You are sexually active and are younger than 60 years of age. ? You are older than 60 years of age and your health care provider tells you that you are at risk for this type of infection. ? Your sexual activity has changed since you were last screened, and you are at increased risk for chlamydia or gonorrhea. Ask your health care provider if you are at risk.  Ask your health care provider about whether you are at high risk for HIV. Your health care provider may recommend a prescription medicine to help prevent HIV infection. If you choose to take medicine to prevent HIV, you should first get tested for HIV. You should then be tested every 3 months for as long as you are taking the medicine. Follow these instructions at home: Lifestyle  Do not use any products that contain nicotine or tobacco, such as cigarettes, e-cigarettes, and chewing tobacco. If you need help quitting, ask your health care provider.  Do not use street drugs.  Do not share needles.  Ask your health care provider for help if you need support or information about quitting drugs. Alcohol use  Do not drink alcohol if your health care provider tells you not to drink.  If you drink alcohol: ? Limit how much you have to 0-2 drinks a day. ? Be aware of how much alcohol is in your drink. In the [place] one drink equals one 12 oz bottle of beer (355 mL), one 5 oz glass of wine (148 mL), or one 1 oz glass of hard liquor (44 mL). General instructions  Schedule regular health, dental, and eye exams.  Stay current with your vaccines.  Tell your  health care provider if: ? You often feel depressed. ? You have ever been abused or do not feel safe at home. Summary  Adopting a healthy lifestyle and getting preventive care are important in promoting health and wellness.  Follow your health care provider's instructions about healthy diet, exercising, and getting tested or screened for diseases.  Follow your health care provider's instructions on monitoring your cholesterol and blood pressure. This information is not intended to replace advice given to you by your health care provider. Make sure you discuss any questions you have with your health care provider. Document Released: 08/14/2007 Document Revised: 02/08/2018 Document Reviewed: 02/08/2018 Elsevier Patient Education  2020 [employer].

## 2018-12-12 ENCOUNTER — Other Ambulatory Visit: Payer: Self-pay

## 2018-12-12 ENCOUNTER — Ambulatory Visit (INDEPENDENT_AMBULATORY_CARE_PROVIDER_SITE_OTHER): Payer: 59

## 2018-12-12 VITALS — BP 130/80 | HR 59

## 2018-12-12 DIAGNOSIS — I10 Essential (primary) hypertension: Secondary | ICD-10-CM | POA: Diagnosis not present

## 2018-12-12 NOTE — Progress Notes (Signed)
For your review.  I made no changes to his medication.

## 2018-12-12 NOTE — Progress Notes (Addendum)
Patient here for nurse visit BP check per MD order from 11/28/18.   Patient reports compliance with prescribed BP medications: YES  Last dose of BP medication: this morning at 9:00 am  Patient said that he is taking all bp meds as prescribed.  Patient said that the pharmacy still has his losartan meds as 50 mg tabs so he takes 2 tabs daily started at the end of September per Dr. Lupita Dawn order.  Patient bp reading was 130/90 initially and HR was 56 at rest.  Allowed patient to rest over 5 more minutes and rechecked bp.  BP was 130/80 and HR was 59.  Patient denies having any symptoms.  BP Readings from Last 3 Encounters:  12/12/18 130/80  11/28/18 (!) 128/92  08/03/18 130/80   Pulse Readings from Last 3 Encounters:  12/12/18 (!) 59  11/28/18 61  05/02/17 [place], CMA   Reviewed above.  Continue current blood pressure medication.  Will forward to Dr Derrel Nip.   Dr Nicki Reaper

## 2019-01-19 ENCOUNTER — Other Ambulatory Visit: Payer: Self-pay

## 2019-01-23 ENCOUNTER — Other Ambulatory Visit: Payer: Self-pay

## 2019-01-23 ENCOUNTER — Other Ambulatory Visit (INDEPENDENT_AMBULATORY_CARE_PROVIDER_SITE_OTHER): Payer: 59

## 2019-01-23 DIAGNOSIS — Z79899 Other long term (current) drug therapy: Secondary | ICD-10-CM | POA: Diagnosis not present

## 2019-01-23 LAB — COMPREHENSIVE METABOLIC PANEL
ALT: 40 U/L (ref 0–53)
AST: 28 U/L (ref 0–37)
Albumin: 4.1 g/dL (ref 3.5–5.2)
Alkaline Phosphatase: 71 U/L (ref 39–117)
BUN: 12 mg/dL (ref 6–23)
CO2: 30 mEq/L (ref 19–32)
Calcium: 9.7 mg/dL (ref 8.4–10.5)
Chloride: 101 mEq/L (ref 96–112)
Creatinine, Ser: 0.9 mg/dL (ref 0.40–1.50)
GFR: 86 mL/min (ref >60.00)
Glucose, Bld: 93 mg/dL (ref 70–99)
Potassium: 4.4 mEq/L (ref 3.5–5.1)
Sodium: 138 mEq/L (ref 135–145)
Total Bilirubin: 1.7 mg/dL — ABNORMAL HIGH (ref 0.2–1.2)
Total Protein: 6.9 g/dL (ref 6.0–8.3)

## 2019-01-28 ENCOUNTER — Other Ambulatory Visit: Payer: Self-pay | Admitting: Internal Medicine

## 2019-02-01 ENCOUNTER — Ambulatory Visit (INDEPENDENT_AMBULATORY_CARE_PROVIDER_SITE_OTHER): Payer: 59

## 2019-02-01 ENCOUNTER — Other Ambulatory Visit: Payer: Self-pay

## 2019-02-01 DIAGNOSIS — Z23 Encounter for immunization: Secondary | ICD-10-CM | POA: Diagnosis not present

## 2019-02-01 NOTE — Progress Notes (Signed)
Jonathan Simon presents today for injection per MD orders. Hep A/B Ijection administered IM in left Upper Arm. Administration without incident. Patient tolerated well.  Nina,cma

## 2019-02-13 ENCOUNTER — Other Ambulatory Visit: Payer: Self-pay | Admitting: Internal Medicine

## 2019-05-22 ENCOUNTER — Other Ambulatory Visit: Payer: Self-pay | Admitting: Internal Medicine

## 2019-05-24 ENCOUNTER — Telehealth: Payer: Self-pay | Admitting: Internal Medicine

## 2019-05-24 NOTE — Telephone Encounter (Signed)
Pt called and said [facility] pharmacy told him they have sent over a request for amlodipine and metoprolol but haven't heard from [place]. Pt is currently without medication.

## 2019-05-24 NOTE — Telephone Encounter (Signed)
Medications have been refilled ?

## 2019-05-29 ENCOUNTER — Ambulatory Visit: Payer: 59 | Admitting: Internal Medicine

## 2019-06-06 ENCOUNTER — Other Ambulatory Visit: Payer: Self-pay

## 2019-06-06 ENCOUNTER — Ambulatory Visit: Payer: 59 | Admitting: Internal Medicine

## 2019-06-06 ENCOUNTER — Encounter: Payer: Self-pay | Admitting: Internal Medicine

## 2019-06-06 VITALS — BP 122/80 | HR 54 | Temp 97.9°F | Resp 15 | Ht 71.0 in | Wt 218.0 lb

## 2019-06-06 DIAGNOSIS — E669 Obesity, unspecified: Secondary | ICD-10-CM

## 2019-06-06 DIAGNOSIS — E785 Hyperlipidemia, unspecified: Secondary | ICD-10-CM

## 2019-06-06 DIAGNOSIS — I10 Essential (primary) hypertension: Secondary | ICD-10-CM

## 2019-06-06 LAB — LIPID PANEL
Cholesterol: 153 mg/dL (ref 0–200)
HDL: 45.5 mg/dL (ref >39.00)
LDL Cholesterol: 87 mg/dL (ref 0–99)
NonHDL: 107.67
Total CHOL/HDL Ratio: 3
Triglycerides: 105 mg/dL (ref 0.0–149.0)
VLDL: 21 mg/dL (ref 0.0–40.0)

## 2019-06-06 LAB — COMPREHENSIVE METABOLIC PANEL
ALT: 42 U/L (ref 0–53)
AST: 38 U/L — ABNORMAL HIGH (ref 0–37)
Albumin: 4.5 g/dL (ref 3.5–5.2)
Alkaline Phosphatase: 69 U/L (ref 39–117)
BUN: 13 mg/dL (ref 6–23)
CO2: 27 mEq/L (ref 19–32)
Calcium: 9.5 mg/dL (ref 8.4–10.5)
Chloride: 103 mEq/L (ref 96–112)
Creatinine, Ser: 0.97 mg/dL (ref 0.40–1.50)
GFR: 78.78 mL/min (ref >60.00)
Glucose, Bld: 98 mg/dL (ref 70–99)
Potassium: 3.9 mEq/L (ref 3.5–5.1)
Sodium: 136 mEq/L (ref 135–145)
Total Bilirubin: 1.5 mg/dL — ABNORMAL HIGH (ref 0.2–1.2)
Total Protein: 6.9 g/dL (ref 6.0–8.3)

## 2019-06-06 MED ORDER — METOPROLOL SUCCINATE ER 25 MG PO TB24: 25.0000 mg | ORAL_TABLET | Freq: Every day | ORAL | 0 refills | Status: DC

## 2019-06-06 MED ORDER — AMLODIPINE BESYLATE 10 MG PO TABS: 10.0000 mg | ORAL_TABLET | Freq: Every day | ORAL | 0 refills | Status: DC

## 2019-06-06 MED ORDER — LOSARTAN POTASSIUM 100 MG PO TABS: 100.0000 mg | ORAL_TABLET | Freq: Every day | ORAL | 1 refills | Status: DC

## 2019-06-06 NOTE — Assessment & Plan Note (Signed)
Well controlled on current regimen. Renal function stable, no changes today. 

## 2019-06-06 NOTE — Patient Instructions (Signed)
Premier Protein low carb high protein shakes  Stoufer's "cauliflower pizza bowl" .  [employer]isine also makes one just as good Warehouse manager)   To make a low carb chip :  Take the Joseph's Lavash or Pita bread,  Or the [place] Low carb whole wheat tortilla   Place on metal cookie sheet  Brush with olive oil  Sprinkle garlic powder (NOT garlic salt), grated parmesan cheese, mediterranean seasoning , or all of them?  Bake at 275 for 30 minutes   We have substitutions for your potatoes!!  Try the mashed cauliflower and riced cauliflower dishes instead of rice and mashed potatoes  Mashed turnips are also very low carb!   For desserts :  Try the Dannon Lt n Fit greek yogurt dessert flavors and top with reddi Whip .  8 carbs,  80 calories  Try Oikos Triple Zero [place] Yogurt in the salted caramel, and the coffee flavors  With Whipped Cream for dessert  breyer's low carb ice cream, available in bars (on a stick, better ) or scoopable ice cream  HERE ARE THE LOW CARB  BREAD CHOICES

## 2019-06-06 NOTE — Assessment & Plan Note (Signed)
LDL has been at  goal on simvastatin 10 mg daily (1/2 tablet )  .

## 2019-06-06 NOTE — Assessment & Plan Note (Signed)
I have addressed  BMI and recommended a low glycemic index diet utilizing smaller more frequent meals to increase metabolism.  I have also recommended that patient start exercising with a goal of 30 minutes of aerobic exercise a minimum of 5 days per week. Screening for lipid disorders, thyroid and diabetes  Has been done

## 2019-06-06 NOTE — Progress Notes (Signed)
Subjective:  Patient ID: Jonathan Simon, male    DOB: 1960/12/29  Age: 59 y.o. MRN: [id number]  CC: The primary encounter diagnosis was Hyperlipidemia with target LDL less than 100. Diagnoses of Essential hypertension and Obesity (BMI 30.0-34.9) were also pertinent to this visit.  HPI Jonathan Simon presents for follow up on hypertension, hyperlipidemia and obesity  This visit occurred during the SARS-CoV-2 public health emergency.  Safety protocols were in place, including screening questions prior to the visit, additional usage of staff PPE, and extensive cleaning of exam room while observing appropriate contact time as indicated for disinfecting solutions.   Hypertension: patient checks blood pressure twice weekly at home.  Readings have been for the most part > 140/80 at rest . Patient is following a reduce salt diet most days and is taking medications as prescribed   He feels generally well, is exercising several times per week and Following a carbohydrate modified diet 6 days per week. Goal is < 200 lbs.  Appetite is good.     Outpatient Medications Prior to Visit  Medication Sig Dispense Refill  . albuterol (PROVENTIL HFA;VENTOLIN HFA) 108 (90 BASE) MCG/ACT inhaler Inhale 2 puffs into the lungs every 6 (six) hours as needed for wheezing. 1 Inhaler 0  . fluticasone (VERAMYST) 27.5 MCG/SPRAY nasal spray Place 2 sprays into the nose as needed.     . Multiple Vitamin (MULTIVITAMIN) capsule Take 1 capsule by mouth daily.    . niacin 500 MG tablet Take 500 mg by mouth daily with breakfast.      . simvastatin (ZOCOR) 20 MG tablet TAKE 1 TABLET BY MOUTH DAILY AT 6 PM 90 tablet 1  . triamcinolone cream (KENALOG) 0.1 %     . amLODipine (NORVASC) 10 MG tablet TAKE 1 TABLET BY MOUTH DAILY 90 tablet 0  . losartan (COZAAR) 100 MG tablet Take 1 tablet (100 mg total) by mouth daily. 90 tablet 1  . metoprolol succinate (TOPROL-XL) 25 MG 24 hr tablet TAKE 1 TABLET BY MOUTH DAILY 90 tablet 0    No facility-administered medications prior to visit.    Review of Systems;  Patient denies headache, fevers, malaise, unintentional weight loss, skin rash, eye pain, sinus congestion and sinus pain, sore throat, dysphagia,  hemoptysis , cough, dyspnea, wheezing, chest pain, palpitations, orthopnea, edema, abdominal pain, nausea, melena, diarrhea, constipation, flank pain, dysuria, hematuria, urinary  Frequency, nocturia, numbness, tingling, seizures,  Focal weakness, Loss of consciousness,  Tremor, insomnia, depression, anxiety, and suicidal ideation.      Objective:  BP 122/80 (BP Location: Left Arm, Patient Position: Sitting, Cuff Size: Large)   Pulse (!) 54   Temp 97.9 F (36.6 C) (Temporal)   Resp 15   Ht 5\' 11"  (1.803 m)   Wt 218 lb (98.9 kg)   SpO2 97%   BMI 30.40 kg/m   BP Readings from Last 3 Encounters:  06/06/19 122/80  12/12/18 130/80  11/28/18 (!) 128/92    Wt Readings from Last 3 Encounters:  06/06/19 218 lb (98.9 kg)  11/28/18 217 lb 9.6 oz (98.7 kg)  05/02/17 216 lb 9.6 oz (98.2 kg)    General appearance: alert, cooperative and appears stated age Ears: normal TM's and external ear canals both ears Throat: lips, mucosa, and tongue normal; teeth and gums normal Neck: no adenopathy, no carotid bruit, supple, symmetrical, trachea midline and thyroid not enlarged, symmetric, no tenderness/mass/nodules Back: symmetric, no curvature. ROM normal. No CVA tenderness. Lungs: clear to auscultation bilaterally Heart:  regular rate and rhythm, S1, S2 normal, no murmur, click, rub or gallop Abdomen: soft, non-tender; bowel sounds normal; no masses,  no organomegaly Pulses: 2+ and symmetric Skin: Skin color, texture, turgor normal. No rashes or lesions Lymph nodes: Cervical, supraclavicular, and axillary nodes normal.  Lab Results  Component Value Date   HGBA1C 5.2 04/22/2016    Lab Results  Component Value Date   CREATININE 0.97 06/06/2019   CREATININE 0.90  01/23/2019   CREATININE 0.86 07/07/2018    Lab Results  Component Value Date   WBC 5.6 04/22/2016   HGB 15.8 04/22/2016   HCT 46.4 04/22/2016   PLT 218.0 04/22/2016   GLUCOSE 98 06/06/2019   CHOL 153 06/06/2019   TRIG 105.0 06/06/2019   HDL 45.50 06/06/2019   LDLDIRECT 88.0 05/02/2017   LDLCALC 87 06/06/2019   ALT 42 06/06/2019   AST 38 (H) 06/06/2019   NA 136 06/06/2019   K 3.9 06/06/2019   CL 103 06/06/2019   CREATININE 0.97 06/06/2019   BUN 13 06/06/2019   CO2 27 06/06/2019   TSH 1.04 05/02/2017   PSA 0.79 07/07/2018   HGBA1C 5.2 04/22/2016    No results found.  Assessment & Plan:   Problem List Items Addressed This Visit      Unprioritized   Hypertension (Chronic)    Well controlled on current regimen. Renal function stable, no changes today.      Relevant Medications   amLODipine (NORVASC) 10 MG tablet   losartan (COZAAR) 100 MG tablet   metoprolol succinate (TOPROL-XL) 25 MG 24 hr tablet   Other Relevant Orders   Comprehensive metabolic panel (Completed)   Hyperlipidemia with target LDL less than 100 - Primary (Chronic)    LDL has been at  goal on simvastatin 10 mg daily (1/2 tablet )  .         Relevant Medications   amLODipine (NORVASC) 10 MG tablet   losartan (COZAAR) 100 MG tablet   metoprolol succinate (TOPROL-XL) 25 MG 24 hr tablet   Other Relevant Orders   Lipid panel (Completed)   Obesity (BMI 30.0-34.9)    I have addressed  BMI and recommended a low glycemic index diet utilizing smaller more frequent meals to increase metabolism.  I have also recommended that patient start exercising with a goal of 30 minutes of aerobic exercise a minimum of 5 days per week. Screening for lipid disorders, thyroid and diabetes  Has been done          I have changed Jonathan Simon's amLODipine and metoprolol succinate. I am also having him maintain his fluticasone, niacin, albuterol, multivitamin, triamcinolone cream, simvastatin, and losartan.  Meds  ordered this encounter  Medications  . amLODipine (NORVASC) 10 MG tablet    Sig: Take 1 tablet (10 mg total) by mouth daily.    Dispense:  90 tablet    Refill:  0    Please fill for 90 days!  Marland Kitchen losartan (COZAAR) 100 MG tablet    Sig: Take 1 tablet (100 mg total) by mouth daily.    Dispense:  90 tablet    Refill:  1    PLEASE FILL FOR 90 DAYS !  . metoprolol succinate (TOPROL-XL) 25 MG 24 hr tablet    Sig: Take 1 tablet (25 mg total) by mouth daily.    Dispense:  90 tablet    Refill:  0    PLEASE FIL FOR 90 DAYS !    Medications Discontinued During This Encounter  Medication Reason  . losartan (COZAAR) 100 MG tablet Reorder  . metoprolol succinate (TOPROL-XL) 25 MG 24 hr tablet Reorder  . amLODipine (NORVASC) 10 MG tablet Reorder   I provided  30 minutes of  face-to-face time during this encounter reviewing patient's current problems and past surgeries, labs and imaging studies, providing counseling on the above mentioned problems , and coordination  of care .  Follow-up: Return in about 6 months (around 12/06/2019).   Crecencio Mc, MD

## 2019-07-24 ENCOUNTER — Telehealth: Payer: Self-pay | Admitting: Internal Medicine

## 2019-07-24 NOTE — Telephone Encounter (Signed)
FYI pt called and states insurance will not pay for 90 day supply on meds

## 2019-07-25 MED ORDER — AMLODIPINE BESYLATE 10 MG PO TABS: 10.0000 mg | ORAL_TABLET | Freq: Every day | ORAL | 5 refills | Status: DC

## 2019-07-25 MED ORDER — SIMVASTATIN 20 MG PO TABS: ORAL_TABLET | ORAL | 5 refills | Status: DC

## 2019-07-25 MED ORDER — LOSARTAN POTASSIUM 100 MG PO TABS: 100.0000 mg | ORAL_TABLET | Freq: Every day | ORAL | 5 refills | Status: DC

## 2019-07-25 MED ORDER — METOPROLOL SUCCINATE ER 25 MG PO TB24: 25.0000 mg | ORAL_TABLET | Freq: Every day | ORAL | 5 refills | Status: DC

## 2019-07-25 NOTE — Telephone Encounter (Signed)
FYI

## 2019-07-25 NOTE — Telephone Encounter (Signed)
Not sure what I'm supposed to do with this information or whether he is needed a 30 day refill on something. Is there a way to update medications with this info without  reordering everything?

## 2019-07-25 NOTE — Telephone Encounter (Signed)
Called pt and he stated that he needed a new prescription sent in for all medications for a 30 day supply since insurance does not cover 90 day supply.

## 2019-09-14 ENCOUNTER — Emergency Department
Admission: EM | Admit: 2019-09-14 | Discharge: 2019-09-14 | Disposition: A | Discharge status: Home / Self Care | Payer: 59 | Attending: Emergency Medicine | Admitting: Emergency Medicine

## 2019-09-14 ENCOUNTER — Emergency Department: Payer: 59

## 2019-09-14 ENCOUNTER — Encounter: Payer: Self-pay | Admitting: Emergency Medicine

## 2019-09-14 ENCOUNTER — Other Ambulatory Visit: Payer: Self-pay

## 2019-09-14 DIAGNOSIS — I10 Essential (primary) hypertension: Secondary | ICD-10-CM | POA: Diagnosis not present

## 2019-09-14 DIAGNOSIS — Z79899 Other long term (current) drug therapy: Secondary | ICD-10-CM | POA: Insufficient documentation

## 2019-09-14 DIAGNOSIS — K4090 Unilateral inguinal hernia, without obstruction or gangrene, not specified as recurrent: Secondary | ICD-10-CM | POA: Insufficient documentation

## 2019-09-14 DIAGNOSIS — M545 Low back pain: Secondary | ICD-10-CM | POA: Diagnosis present

## 2019-09-14 IMAGING — CT CT RENAL STONE PROTOCOL
2 of 4 series · 16 of 46 positions shown, 18 images · non-contrast
Comparison: CT abdomen and pelvis [DATE].

CLINICAL DATA: Right low back and groin pain for 2 weeks.

EXAM:
CT ABDOMEN AND PELVIS WITHOUT CONTRAST
TECHNIQUE: Multidetector CT imaging of the abdomen and pelvis was performed
following the standard protocol without IV contrast.

[Series 2: stone full standard · axial · 0.75mm/px · z∈[-572,-112]mm · 13 of 102 slices shown, 15 images]
[im 5/102  soft-tissue]
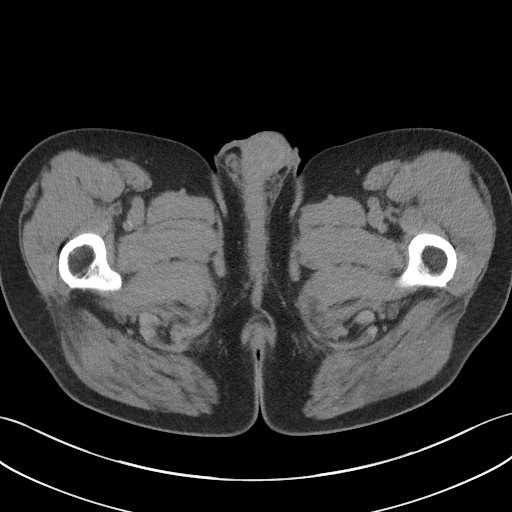
[im 5/102  bone]
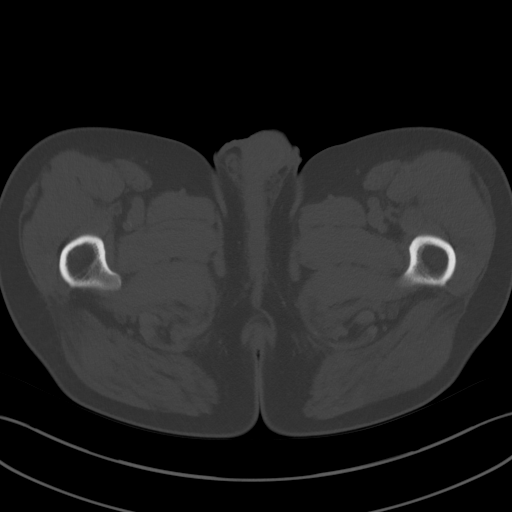
[im 14/102  soft-tissue]
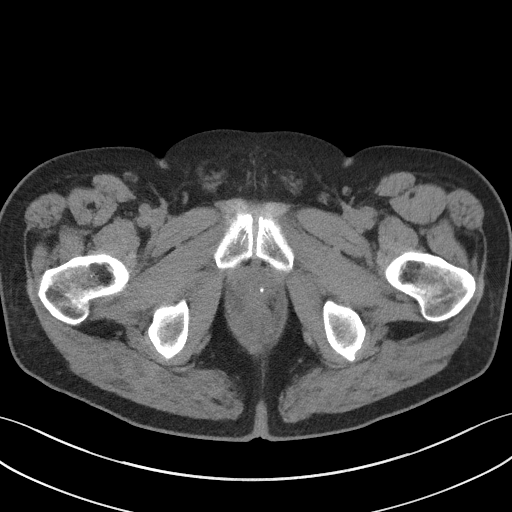
[im 22/102  soft-tissue]
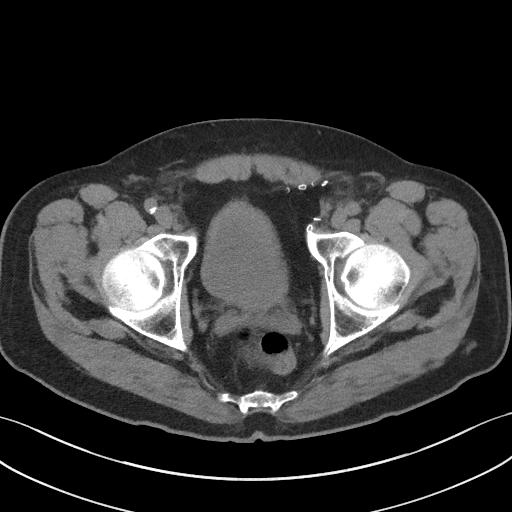
[im 27/102  soft-tissue]
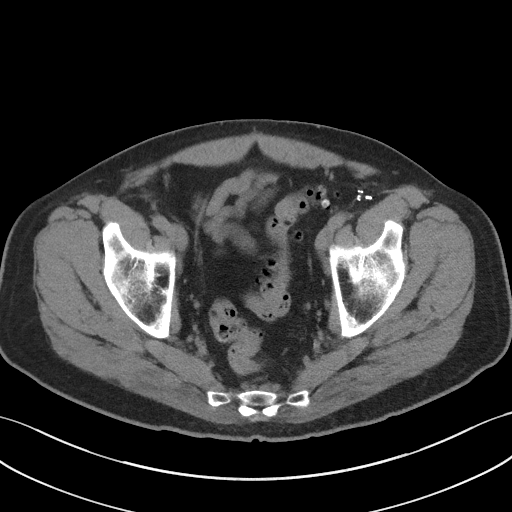
[im 36/102  soft-tissue]
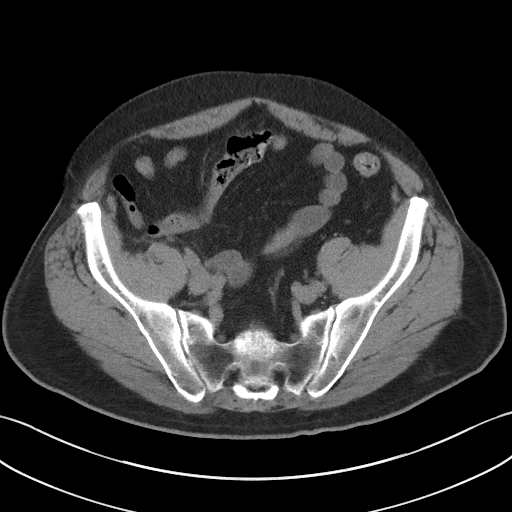
[im 44/102  soft-tissue]
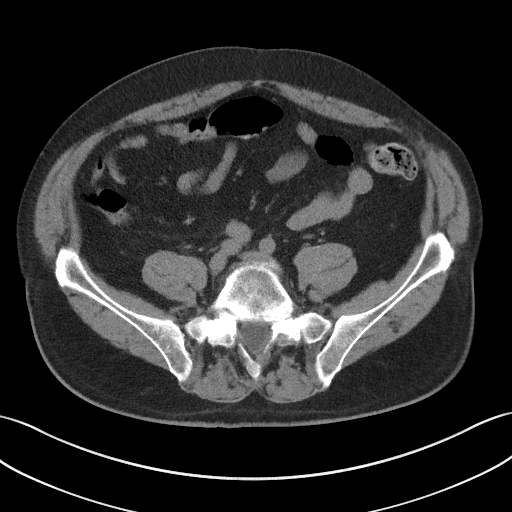
[im 53/102  soft-tissue]
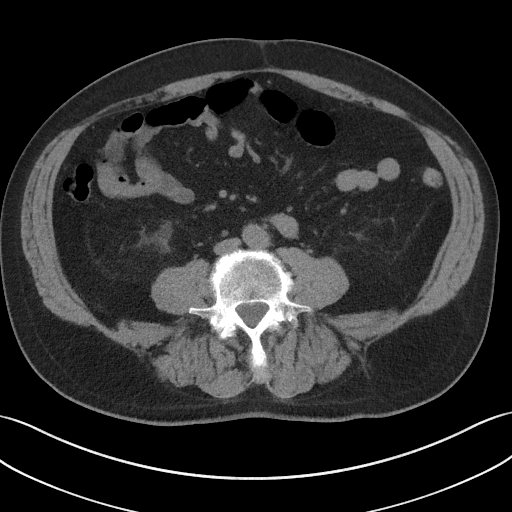
[im 58/102  soft-tissue]
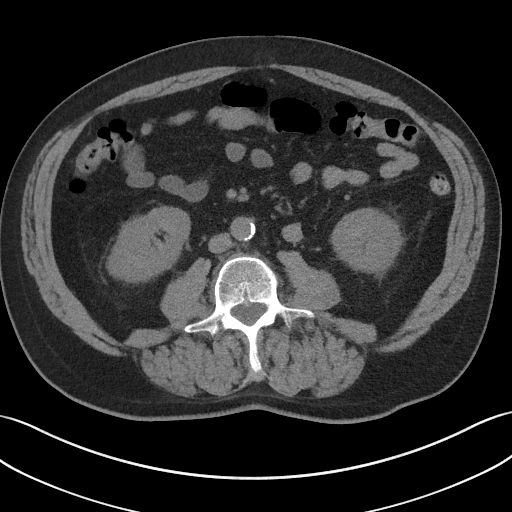
[im 66/102  soft-tissue]
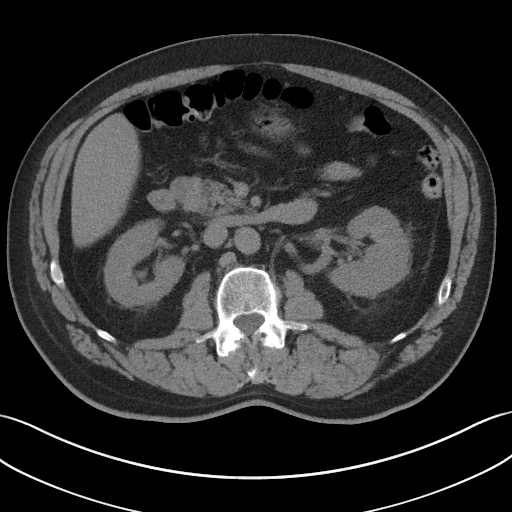
[im 66/102  bone]
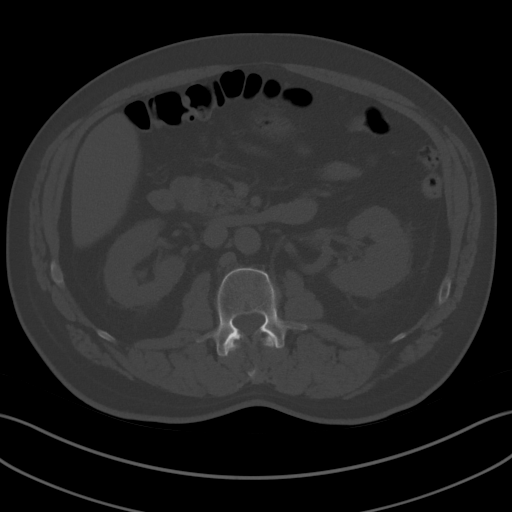
[im 75/102  soft-tissue]
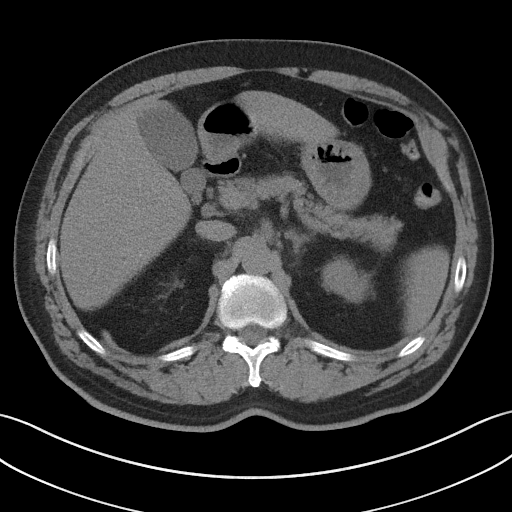
[im 80/102  soft-tissue]
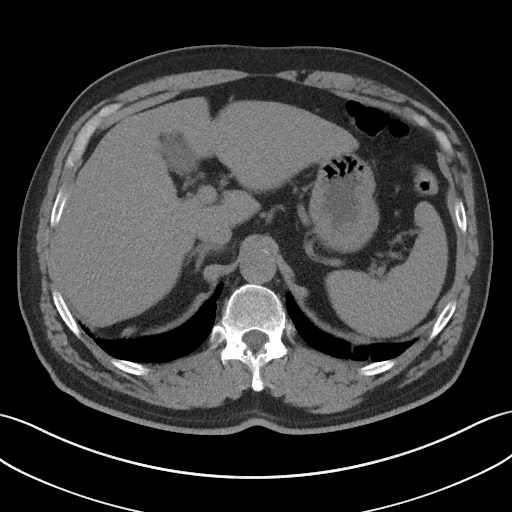
[im 88/102  soft-tissue]
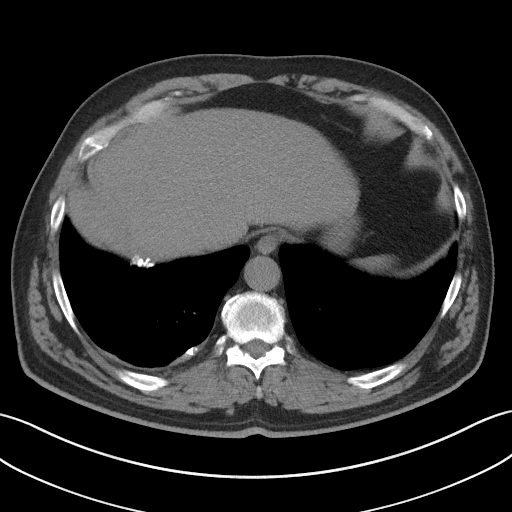
[im 97/102  soft-tissue]
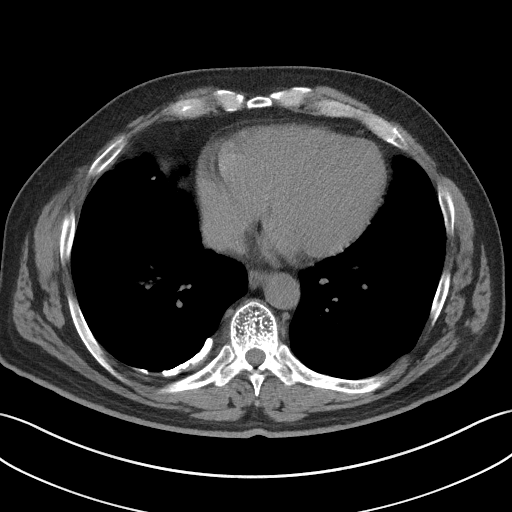

[Series 5: coronal · coronal · 0.83mm/px · 3 of 155 slices shown]
[im 52/155  soft-tissue]
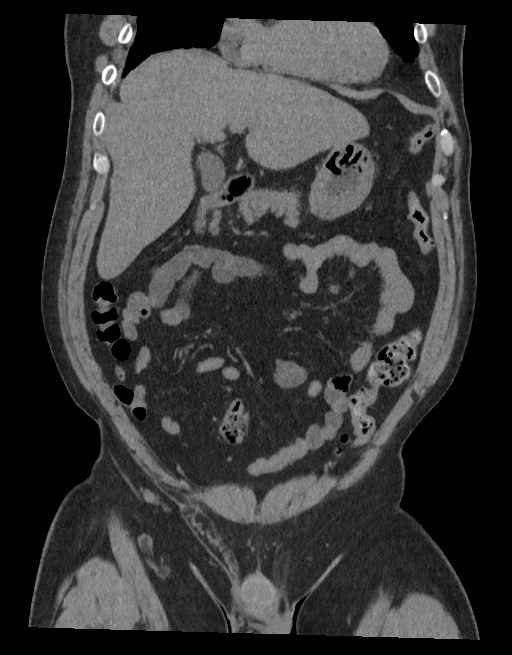
[im 69/155  soft-tissue]
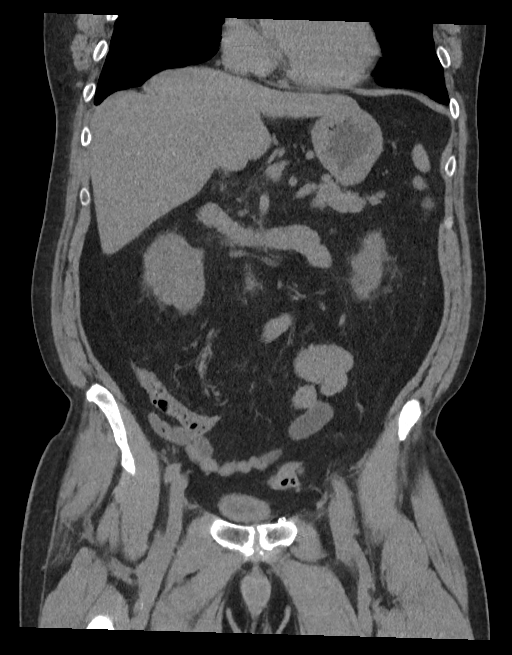
[im 86/155  soft-tissue]
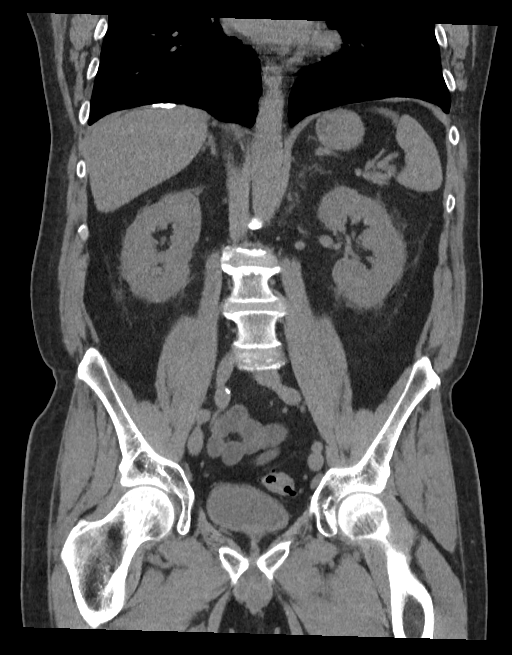

[16 of 46 positions shown; findings below may reference images not displayed]

FINDINGS: Lower chest: Calcified pleural plaques are seen on the right. There
is emphysematous disease in the lung bases. Lung bases are clear. No
pleural or pericardial effusion.

Hepatobiliary: No focal liver abnormality is seen. No gallstones,
gallbladder wall thickening, or biliary dilatation. The liver is
diffusely low attenuating consistent with fatty infiltration.

Pancreas: Unremarkable. No pancreatic ductal dilatation or
surrounding inflammatory changes.

Spleen: Normal in size without focal abnormality.

Adrenals/Urinary Tract: The adrenal glands are normal. Cyst in the
upper pole of the left kidney is unchanged. The kidneys are
otherwise unremarkable without stone or hydronephrosis. Ureters and
urinary bladder appear normal.

Stomach/Bowel: Stomach is within normal limits. Appendix appears
normal. No evidence of bowel wall thickening, distention, or
inflammatory changes. Sigmoid diverticulosis noted.

Vascular/Lymphatic: Aortic atherosclerosis. No enlarged abdominal or
pelvic lymph nodes.

Reproductive: Prostate is unremarkable.

Other: Small fat containing right inguinal hernia is noted. The
patient is status post repair of a left inguinal hernia. Fat
containing umbilical hernia noted.

Musculoskeletal: No acute or focal abnormality.
IMPRESSION: Negative for urinary tract stone. No acute abnormality abdomen or
pelvis.

Calcified pleural plaques consistent with prior asbestos exposure.

Fatty infiltration of the liver.

Small fat containing right inguinal and umbilical hernias.

Diverticulosis without diverticulitis.

Aortic Atherosclerosis ([2N]-[2N]) and Emphysema ([2N]-[2N]).

## 2019-09-14 MED ORDER — OXYCODONE-ACETAMINOPHEN 5-325 MG PO TABS: 1.0000 | ORAL_TABLET | Freq: Four times a day (QID) | ORAL | 0 refills | Status: DC | PRN

## 2019-09-14 MED ORDER — OXYCODONE-ACETAMINOPHEN 5-325 MG PO TABS
1.0000 | ORAL_TABLET | ORAL | Status: DC | PRN
  Administered 2019-09-14: 1 via ORAL
  Filled 2019-09-14: qty 1

## 2019-09-14 MED ORDER — HYDROMORPHONE HCL 1 MG/ML IJ SOLN
1.0000 mg | INTRAMUSCULAR | Status: AC
  Administered 2019-09-14: 1 mg via INTRAMUSCULAR
  Filled 2019-09-14: qty 1

## 2019-09-14 MED ORDER — NAPROXEN 500 MG PO TABS
500.0000 mg | ORAL_TABLET | Freq: Two times a day (BID) | ORAL | 0 refills | Status: DC
Start: 2019-09-14 — End: 2019-09-19

## 2019-09-14 NOTE — Discharge Instructions (Signed)
Your CT scan shows a small inguinal hernia that is causing your groin pain.  It does not have intestine in it and is not dangerous right now.  Return to the ER if you have sudden severe worsening of pain. Otherwise, please follow up with general surgery for further evaluation of this finding.

## 2019-09-14 NOTE — ED Provider Notes (Signed)
[facility] Emergency Department Provider Note  ____________________________________________  Time seen: Approximately 12:52 PM  I have reviewed the triage vital signs and the nursing notes.   HISTORY  Chief Complaint Groin Pain and Back Pain    HPI Jonathan Simon is a 59 y.o. male with a history of hypertension, alcohol abuse, GERD who comes the ED complaining of right low back pain for the last 2 weeks that is now migrated into his right groin.  No aggravating or alleviating factors.  Moderate intensity, sharp.  Denies dysuria frequency urgency hematuria or trauma.  No fever or vomiting.  No constipation.  Denies any hernias.  Was seen at [facility] clinic this morning, had labs and received a Toradol injection.  Labs reviewed by EMR, unremarkable, urinalysis normal.  Toradol did not provide adequate pain relief so he was referred to ED for further evaluation.      Past Medical History:  Diagnosis Date  . Acute maxillary sinusitis   . Acute upper respiratory infections of unspecified site   . Edema   . Esophageal reflux   . Essential hypertension, benign   . External hemorrhoids without mention of complication   . Other and unspecified hyperlipidemia   . Other malaise and fatigue   . Spontaneous pneumothorax    bilateral s/p surgery/pleurodesis [facility]   . Treadmill stress test negative for angina pectoris 2006   [facility]     Patient Active Problem List   Diagnosis Date Noted  . Educated about COVID-19 virus infection 07/29/2018  . Alcohol abuse 04/24/2016  . Encounter for screening colonoscopy 11/10/2013  . BPH (benign prostatic hypertrophy) 11/08/2013  . Erectile dysfunction 11/08/2013  . Obesity (BMI 30.0-34.9) 10/13/2012  . Encounter for preventive health examination 12/07/2011  . Hypertension 12/07/2011  . Hyperlipidemia with target LDL less than 100 12/07/2011  . Screening for colon cancer 12/07/2011     Past Surgical History:   Procedure Laterality Date  . COLONOSCOPY WITH PROPOFOL N/A 06/25/2016   Procedure: COLONOSCOPY WITH PROPOFOL;  Surgeon: Robert Bellow, MD;  Location: [facility] ENDOSCOPY;  Service: Endoscopy;  Laterality: N/A;  . HEMORRHOID SURGERY    . INGUINAL HERNIA REPAIR     bilateral  . laparatomy     exploratory secondary to lightening rod impalement at [facility]  . TALC PLEURODESIS     of bilateral pneumothrax     Prior to Admission medications   Medication Sig Start Date End Date Taking? Authorizing Provider  albuterol (PROVENTIL HFA;VENTOLIN HFA) 108 (90 BASE) MCG/ACT inhaler Inhale 2 puffs into the lungs every 6 (six) hours as needed for wheezing. 12/07/11   Crecencio Mc, MD  amLODipine (NORVASC) 10 MG tablet Take 1 tablet (10 mg total) by mouth daily. 07/25/19   Crecencio Mc, MD  fluticasone (VERAMYST) 27.5 MCG/SPRAY nasal spray Place 2 sprays into the nose as needed.     [provider]  losartan (COZAAR) 100 MG tablet Take 1 tablet (100 mg total) by mouth daily. 07/25/19   Crecencio Mc, MD  metoprolol succinate (TOPROL-XL) 25 MG 24 hr tablet Take 1 tablet (25 mg total) by mouth daily. 07/25/19   Crecencio Mc, MD  Multiple Vitamin (MULTIVITAMIN) capsule Take 1 capsule by mouth daily.    [provider]  naproxen (NAPROSYN) 500 MG tablet Take 1 tablet (500 mg total) by mouth 2 (two) times daily with a meal. 09/14/19   Carrie Mew, MD  niacin 500 MG tablet Take 500 mg by mouth  daily with breakfast.      [provider]  oxyCODONE-acetaminophen (PERCOCET) 5-325 MG tablet Take 1 tablet by mouth every 6 (six) hours as needed for severe pain. 09/14/19 09/13/20  Carrie Mew, MD  simvastatin (ZOCOR) 20 MG tablet TAKE 1 TABLET BY MOUTH DAILY AT 6 PM 07/25/19   Crecencio Mc, MD  triamcinolone cream (KENALOG) 0.1 %  04/28/17   [provider]     Allergies Patient has no known allergies.   Family History  Problem Relation Age of Onset  . Heart  disease Father 57       Massive MI   . COPD Mother 49  . Heart disease Paternal Grandfather   . Colon cancer Neg Hx     Social History Social History   Tobacco Use  . Smoking status: Never Smoker  . Smokeless tobacco: Never Used  Substance Use Topics  . Alcohol use: Yes    Alcohol/week: 42.0 standard drinks    Types: 42 Cans of beer per week    Comment: had been drinking excessively until Jul 13 2018  . Drug use: No    Review of Systems  Constitutional:   No fever or chills.  ENT:   No sore throat. No rhinorrhea. Cardiovascular:   No chest pain or syncope. Respiratory:   No dyspnea or cough. Gastrointestinal:   Negative for abdominal pain, vomiting and diarrhea.  Musculoskeletal:   Negative for focal pain or swelling All other systems reviewed and are negative except as documented above in ROS and HPI.  ____________________________________________   PHYSICAL EXAM:  VITAL SIGNS: ED Triage Vitals  Enc Vitals Group     BP 09/14/19 1040 (!) 157/95     Pulse Rate 09/14/19 1040 70     Resp 09/14/19 1040 19     Temp 09/14/19 1040 98.5 F (36.9 C)     Temp src --      SpO2 09/14/19 1040 96 %     Weight 09/14/19 1035 200 lb (90.7 kg)     Height 09/14/19 1035 5\' 10"  (1.778 m)     Head Circumference --      Peak Flow --      Pain Score 09/14/19 1034 9     Pain Loc --      Pain Edu? --      Excl. in [place]? --     Vital signs reviewed, nursing assessments reviewed.   Constitutional:   Alert and oriented. Non-toxic appearance. Eyes:   Conjunctivae are normal. EOMI. PERRL. ENT      Head:   Normocephalic and atraumatic.      Nose:   Wearing a mask.      Mouth/Throat:   Wearing a mask.      Neck:   No meningismus. Full ROM. Hematological/Lymphatic/Immunilogical:   No cervical or inguinal lymphadenopathy. Cardiovascular:   RRR. Symmetric bilateral radial and DP pulses.  No murmurs. Cap refill less than 2 seconds. Respiratory:   Normal respiratory effort without  tachypnea/retractions. Breath sounds are clear and equal bilaterally. No wheezes/rales/rhonchi. Gastrointestinal:   Soft and nontender. Non distended. There is no CVA tenderness.  No rebound, rigidity, or guarding. Genitourinary:   Normal external exam, no genital lesions.  No significant scrotal tenderness or swelling.  No hernias. Musculoskeletal:   Normal range of motion in all extremities. No joint effusions.  No lower extremity tenderness.  No edema. Neurologic:   Normal speech and language.  Motor grossly intact. No acute focal neurologic  deficits are appreciated.  Skin:    Skin is warm, dry and intact. No rash noted.  No petechiae, purpura, or bullae.  ____________________________________________    LABS (pertinent positives/negatives) (all labs ordered are listed, but only abnormal results are displayed) Labs Reviewed - No data to display ____________________________________________   EKG    ____________________________________________    RADIOLOGY  CT Renal Stone Study  Result Date: 09/14/2019 CLINICAL DATA:  Right low back and groin pain for 2 weeks. EXAM: CT ABDOMEN AND PELVIS WITHOUT CONTRAST TECHNIQUE: Multidetector CT imaging of the abdomen and pelvis was performed following the standard protocol without IV contrast. COMPARISON:  CT abdomen and pelvis 12/29/2010. FINDINGS: Lower chest: Calcified pleural plaques are seen on the right. There is emphysematous disease in the lung bases. Lung bases are clear. No pleural or pericardial effusion. Hepatobiliary: No focal liver abnormality is seen. No gallstones, gallbladder wall thickening, or biliary dilatation. The liver is diffusely low attenuating consistent with fatty infiltration. Pancreas: Unremarkable. No pancreatic ductal dilatation or surrounding inflammatory changes. Spleen: Normal in size without focal abnormality. Adrenals/Urinary Tract: The adrenal glands are normal. Cyst in the upper pole of the left kidney is  unchanged. The kidneys are otherwise unremarkable without stone or hydronephrosis. Ureters and urinary bladder appear normal. Stomach/Bowel: Stomach is within normal limits. Appendix appears normal. No evidence of bowel wall thickening, distention, or inflammatory changes. Sigmoid diverticulosis noted. Vascular/Lymphatic: Aortic atherosclerosis. No enlarged abdominal or pelvic lymph nodes. Reproductive: Prostate is unremarkable. Other: Small fat containing right inguinal hernia is noted. The patient is status post repair of a left inguinal hernia. Fat containing umbilical hernia noted. Musculoskeletal: No acute or focal abnormality. IMPRESSION: Negative for urinary tract stone. No acute abnormality abdomen or pelvis. 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 Fatty infiltration of the liver. Small fat containing right inguinal and umbilical hernias. Diverticulosis without diverticulitis. Aortic Atherosclerosis (ICD10-I70.0) and Emphysema (ICD10-J43.9). Electronically Signed   By: Inge Rise M.D.   On: 09/14/2019 13:32    ____________________________________________   PROCEDURES Procedures  ____________________________________________  DIFFERENTIAL DIAGNOSIS   Ureterolithiasis, epididymitis, appendicitis, functional pain, contusion  CLINICAL IMPRESSION / ASSESSMENT AND PLAN / ED COURSE  Medications ordered in the ED: Medications  oxyCODONE-acetaminophen (PERCOCET/ROXICET) 5-325 MG per tablet 1 tablet (1 tablet Oral Given 09/14/19 1058)  HYDROmorphone (DILAUDID) injection 1 mg (1 mg Intramuscular Given 09/14/19 1308)    Pertinent labs & imaging results that were available during my care of the patient were reviewed by me and considered in my medical decision making (see chart for details).  Jonathan Simon was evaluated in Emergency Department on 09/14/2019 for the symptoms described in the history of present illness. He was evaluated in the context of the global  COVID-19 pandemic, which necessitated consideration that the patient might be at risk for infection with the SARS-CoV-2 virus that causes COVID-19. Institutional protocols and algorithms that pertain to the evaluation of patients at risk for COVID-19 are in a state of rapid change based on information released by regulatory bodies including the CDC and federal and state organizations. These policies and algorithms were followed during the patient's care in the ED.   Patient presents with right low back pain and right groin pain evolving over the past 2 weeks.  He is nontoxic, vital signs are normal, exam is overall reassuring.  Doubt torsion or STI.  Will obtain CT scan of the abdomen and pelvis to evaluate for obstructing kidney stone.  Will give a dose of intramuscular Dilaudid  for pain relief as he is already had Toradol and a Percocet without adequate relief.   ----------------------------------------- 3:20 PM on 09/14/2019 -----------------------------------------  CT scan shows a small fat-containing right inguinal hernia which explains the pain.  No obstructing stones.  Otherwise benign exam.  Stable for discharge home.  Work note provided, follow-up with general surgery.     ____________________________________________   FINAL CLINICAL IMPRESSION(S) / ED DIAGNOSES    Final diagnoses:  Inguinal hernia of right side without obstruction or gangrene     ED Discharge Orders         Ordered    naproxen (NAPROSYN) 500 MG tablet  2 times daily with meals     Discontinue  Reprint     09/14/19 1520    oxyCODONE-acetaminophen (PERCOCET) 5-325 MG tablet  Every 6 hours PRN     Discontinue  Reprint     09/14/19 1520          Portions of this note were generated with dragon dictation software. Dictation errors may occur despite best attempts at proofreading.   Carrie Mew, MD 09/14/19 1521

## 2019-09-14 NOTE — ED Notes (Signed)
Sonjia RN notified the patient placed in room 1

## 2019-09-14 NOTE — ED Triage Notes (Signed)
Right low back and right groin pain for 2 weeks .  Went to [facility] today and they gave a shot of toradol and it didn't work so he was sent here.

## 2019-09-19 ENCOUNTER — Other Ambulatory Visit: Payer: Self-pay

## 2019-09-19 ENCOUNTER — Encounter: Payer: Self-pay | Admitting: Surgery

## 2019-09-19 ENCOUNTER — Telehealth: Payer: Self-pay | Admitting: Surgery

## 2019-09-19 ENCOUNTER — Ambulatory Visit (INDEPENDENT_AMBULATORY_CARE_PROVIDER_SITE_OTHER): Payer: Managed Care, Other (non HMO) | Admitting: Surgery

## 2019-09-19 VITALS — BP 146/93 | HR 69 | Temp 98.1°F | Resp 12 | Ht 70.0 in | Wt 209.0 lb

## 2019-09-19 DIAGNOSIS — K432 Incisional hernia without obstruction or gangrene: Secondary | ICD-10-CM | POA: Diagnosis not present

## 2019-09-19 DIAGNOSIS — K4091 Unilateral inguinal hernia, without obstruction or gangrene, recurrent: Secondary | ICD-10-CM

## 2019-09-19 NOTE — Progress Notes (Signed)
09/19/2019  Reason for Visit:  Recurrent right inguinal hernia and umbilical hernia  History of Present Illness: Jonathan Simon is a 59 y.o. male presenting for evaluation of a recurrent right inguinal hernia and incisional/umbilical hernia.  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 Leanora Cover.    He presented to the ER on 7/16 with a two week history of pain.  This initially had started in his low back but then started migrating to the right groin and eventually radiating towards the right testicle.  He had a CT scan which showed a right fat containing inguinal hernia and a fat containing umbilical hernia.  Outpatient labs that same day showed a normal WBC.  He was discharged to home with close follow up with [place].  Today he reports that he has some soreness that sometimes gets worse depending on his activity level.  It take a while for the discomfort to settle down.  Denies any nausea, vomiting, constipation, obstructive symptoms.  Denies any difficulty with urination.  Past Medical History: Past Medical History:  Diagnosis Date  . Acute maxillary sinusitis   . Acute upper respiratory infections of unspecified site   . Edema   . Esophageal reflux   . Essential hypertension, benign   . External hemorrhoids without mention of complication   . Other and unspecified hyperlipidemia   . Other malaise and fatigue   . Spontaneous pneumothorax    bilateral s/p surgery/pleurodesis [facility]   . Treadmill stress test negative for angina pectoris 2006   [facility]     Past Surgical History: Past Surgical History:  Procedure Laterality Date  . COLONOSCOPY WITH PROPOFOL N/A 06/25/2016   Procedure: COLONOSCOPY WITH PROPOFOL;  Surgeon:  Robert Bellow, MD;  Location: [facility] ENDOSCOPY;  Service: Endoscopy;  Laterality: N/A;  . COLOSTOMY REVERSAL  1988  . EXPLORATORY LAPAROTOMY  1988   with loop colostomy secondary to lightning rod impalement at [facility]  . HEMORRHOID SURGERY    . INGUINAL HERNIA REPAIR Right 2012   Open, with mesh, Dr. Leanora Cover  . LAPAROSCOPIC INGUINAL HERNIA REPAIR Left    before 2012  . THORACOTOMY Right 1987   with blebectomy and pleurodesis  . THORACOTOMY Left 1997   with blebectomy and pleurodesis    Home Medications: Prior to Admission medications   Medication Sig Start Date End Date Taking? Authorizing Provider  albuterol (PROVENTIL HFA;VENTOLIN HFA) 108 (90 BASE) MCG/ACT inhaler Inhale 2 puffs into the lungs every 6 (six) hours as needed for wheezing. 12/07/11  Yes Crecencio Mc, MD  amLODipine (NORVASC) 10 MG tablet Take 1 tablet (10 mg total) by mouth daily. 07/25/19  Yes Crecencio Mc, MD  fluticasone (VERAMYST) 27.5 MCG/SPRAY nasal spray Place 2 sprays into the nose as needed.    Yes [provider]  losartan (COZAAR) 100 MG tablet Take 1 tablet (100 mg total) by mouth daily. 07/25/19  Yes Crecencio Mc, MD  metoprolol succinate (TOPROL-XL) 25 MG 24 hr tablet Take 1 tablet (25 mg total) by mouth daily. 07/25/19  Yes Crecencio Mc, MD  Multiple Vitamin (MULTIVITAMIN) capsule Take 1 capsule by mouth daily.   Yes [provider]  niacin 500 MG tablet Take 500 mg by mouth daily with breakfast.     Yes [provider]  oxyCODONE-acetaminophen (PERCOCET) 5-325 MG tablet Take 1 tablet by mouth every 6 (six) hours as needed for severe pain. 09/14/19 09/13/20 Yes Carrie Mew, MD  simvastatin (ZOCOR) 20 MG tablet TAKE 1 TABLET BY MOUTH DAILY AT 6 PM 07/25/19  Yes Crecencio Mc, MD  triamcinolone cream (KENALOG) 0.1 %  04/28/17  Yes [provider]    Allergies: No Known Allergies  Social History:  reports that he has never smoked. He has never used  smokeless tobacco. He reports current alcohol use of about 42.0 standard drinks of alcohol per week. He reports that he does not use drugs.   Family History: Family History  Problem Relation Age of Onset  . Heart disease Father 72       Massive MI   . COPD Mother 70  . Heart disease Paternal Grandfather   . Colon cancer Neg Hx     Review of Systems: Review of Systems  Constitutional: Negative for chills and fever.  HENT: Negative for hearing loss.   Eyes: Negative for blurred vision.  Respiratory: Negative for shortness of breath.   Cardiovascular: Negative for chest pain.  Gastrointestinal: Positive for abdominal pain. Negative for constipation, diarrhea, nausea and vomiting.  Genitourinary: Negative for dysuria.  Musculoskeletal: Positive for back pain. Negative for myalgias.  Skin: Negative for rash.  Neurological: Negative for dizziness.  Psychiatric/Behavioral: Negative for depression.    Physical Exam BP (!) 146/93   Pulse 69   Temp 98.1 F (36.7 C) (Oral)   Resp 12   Ht _0  (1.778 m)   Wt 209 lb (94.8 kg)   SpO2 96%   BMI 29.99 kg/m  CONSTITUTIONAL: No acute distress HEENT:  Normocephalic, atraumatic, extraocular motion intact. NECK: Trachea is midline, and there is no jugular venous distension.  RESPIRATORY:  Lungs are clear, and breath sounds are equal bilaterally. Normal respiratory effort without pathologic use of accessory muscles. CARDIOVASCULAR: Heart is regular without murmurs, gallops, or rubs. GI: The abdomen is soft, non-distended.  Has well healed midline laparotomy scar, LLQ ostomy scar, what appears to be a laparoscopic umbilical scar, and a right groin scar.  Has a small umbilical incisional hernia which is reducible with minimal discomfort.  He has a small right inguinal hernia, which is reducible, and does not extend to the scrotum.  No palpable hernia on the left side. MUSCULOSKELETAL:  Normal muscle strength and tone in all four extremities.   No peripheral edema or cyanosis. SKIN: Skin turgor is normal. There are no pathologic skin lesions.  NEUROLOGIC:  Motor and sensation is grossly normal.  Cranial nerves are grossly intact. PSYCH:  Alert and oriented to person, place and time. Affect is normal.  Laboratory Analysis: Labs on 09/14/19: WBC 4.8, Hgb 16, Hct 46.7, Plt 182.  Na 136, K 3.6, Cl 100, CO2 30.1, BUN 8, Cr 0.9.  Total bilirubin 2.2, AST 46, ALT 50, alk phos 84.  U/A with rare bacteria but no WBC.     Imaging: CT scan abdomen/pelvis w/o contrast 09/14/19: IMPRESSION: Negative for urinary tract stone. No acute abnormality abdomen or pelvis.  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  Fatty infiltration of the liver.  Small fat containing right inguinal and umbilical hernias.  Diverticulosis without diverticulitis.   Assessment and Plan: This is a 59 y.o. male with a recurrent right inguinal hernia and an incisional/umbilical hernia.  --Discussed with the patient the results from his CT scan.  Discussed with him that we can  repair both hernias in the same clinical setting, and I would recommend a robotic right inguinal hernia repair and open umbilical hernia repair.  Discussed with him the surgery at length, including risks of bleeding, infection, and injury to surrounding structures.  Discussed with him that this is an outpatient surgery, post-operative activity/lifting restrictions, pain expectations, and overall recovery. He is willing to proceed. --We will schedule him for 10/02/19.  Will obtain medical clearance as a precaution.  Discussed with him that he would also need a COVID test prior to surgery.    Face-to-face time spent with the patient and care providers was 45 minutes, with more than 50% of the time spent counseling, educating, and coordinating care of the patient.     Melvyn Neth, [facility]

## 2019-09-19 NOTE — Telephone Encounter (Signed)
Incoming call from patient.  He is aware of all dates regarding surgery and voices understanding.

## 2019-09-19 NOTE — Telephone Encounter (Signed)
Outgoing call made, left message for patient to call. Please advise patient of Pre-Admission date/time, COVID Testing date and Surgery date.  Surgery Date: 10/02/19 Preadmission Testing Date: 09/25/19 (phone 8a-1p) Covid Testing Date: 09/28/19 - patient advised to go to the [facility] between 8a-1p  Also patient needs to call [phone], between 1-3:00pm the day before surgery, to find out what time to arrive for surgery.

## 2019-09-19 NOTE — Progress Notes (Signed)
Medical Clearance request has been faxed to Dr Lupita Dawn office.

## 2019-09-19 NOTE — Patient Instructions (Addendum)
You have chose to have your hernia repaired. This will be done by Dr. Hampton Abbot at [facility].  Please see your (blue) Pre-care information that you have been given today. Our surgery scheduler will call you to go over surgery date and information.  You will need to arrange to be out of work for 2 weeks and then return with a lifting restrictions for 4 more weeks. Please send any FMLA paperwork prior to surgery and we will fill this out and fax it back to your employer within 3 business days.  You may have a bruise in your groin and also swelling and brusing in your testicle area. You may use ice 4-5 times daily for 15-20 minutes each time. Make sure that you place a barrier between you and the ice pack. To decrease the swelling, you may roll up a bath towel and place it vertically in between your thighs with your testicles resting on the towel. You will want to keep this area elevated as much as possible for several days following surgery.    Inguinal Hernia, Adult Muscles help keep everything in the body in its proper place. But if a weak spot in the muscles develops, something can poke through. That is called a hernia. When this happens in the lower part of the belly (abdomen), it is called an inguinal hernia. (It takes its name from a part of the body in this region called the inguinal canal.) A weak spot in the wall of muscles lets some fat or part of the small intestine bulge through. An inguinal hernia can develop at any age. Men get them more often than women. CAUSES  In adults, an inguinal hernia develops over time.  It can be triggered by:  Suddenly straining the muscles of the lower abdomen.  Lifting heavy objects.  Straining to have a bowel movement. Difficult bowel movements (constipation) can lead to this.  Constant coughing. This may be caused by smoking or lung disease.  Being overweight.  Being pregnant.  Working at a job that requires long periods of standing or heavy  lifting.  Having had an inguinal hernia before. One type can be an emergency situation. It is called a strangulated inguinal hernia. It develops if part of the small intestine slips through the weak spot and cannot get back into the abdomen. The blood supply can be cut off. If that happens, part of the intestine may die. This situation requires emergency surgery. SYMPTOMS  Often, a small inguinal hernia has no symptoms. It is found when a healthcare provider does a physical exam. Larger hernias usually have symptoms.   In adults, symptoms may include:  A lump in the groin. This is easier to see when the person is standing. It might disappear when lying down.  In men, a lump in the scrotum.  Pain or burning in the groin. This occurs especially when lifting, straining or coughing.  A dull ache or feeling of pressure in the groin.  Signs of a strangulated hernia can include:  A bulge in the groin that becomes very painful and tender to the touch.  A bulge that turns red or purple.  Fever, nausea and vomiting.  Inability to have a bowel movement or to pass gas. DIAGNOSIS  To decide if you have an inguinal hernia, a healthcare provider will probably do a physical examination.  This will include asking questions about any symptoms you have noticed.  The healthcare provider might feel the groin area and ask you  to cough. If an inguinal hernia is felt, the healthcare provider may try to slide it back into the abdomen.  Usually no other tests are needed. TREATMENT  Treatments can vary. The size of the hernia makes a difference. Options include:  Watchful waiting. This is often suggested if the hernia is small and you have had no symptoms.  No medical procedure will be done unless symptoms develop.  You will need to watch closely for symptoms. If any occur, contact your healthcare provider right away.  Surgery. This is used if the hernia is larger or you have symptoms.  Open  surgery. This is usually an outpatient procedure (you will not stay overnight in a hospital). An cut (incision) is made through the skin in the groin. The hernia is put back inside the abdomen. The weak area in the muscles is then repaired by herniorrhaphy or hernioplasty. Herniorrhaphy: in this type of surgery, the weak muscles are sewn back together. Hernioplasty: a patch or mesh is used to close the weak area in the abdominal wall.  Laparoscopy. In this procedure, a surgeon makes small incisions. A thin tube with a tiny video camera (called a laparoscope) is put into the abdomen. The surgeon repairs the hernia with mesh by looking with the video camera and using two long instruments. HOME CARE INSTRUCTIONS   After surgery to repair an inguinal hernia:  You will need to take pain medicine prescribed by your healthcare provider. Follow all directions carefully.  You will need to take care of the wound from the incision.  Your activity will be restricted for awhile. This will probably include no heavy lifting for several weeks. You also should not do anything too active for a few weeks. When you can return to work will depend on the type of job that you have.  During "watchful waiting" periods, you should:  Maintain a healthy weight.  Eat a diet high in fiber (fruits, vegetables and whole grains).  Drink plenty of fluids to avoid constipation. This means drinking enough water and other liquids to keep your urine clear or pale yellow.  Do not lift heavy objects.  Do not stand for long periods of time.  Quit smoking. This should keep you from developing a frequent cough. SEEK MEDICAL CARE IF:   A bulge develops in your groin area.  You feel pain, a burning sensation or pressure in the groin. This might be worse if you are lifting or straining.  You develop a fever of more than 100.5 F (38.1 C). SEEK IMMEDIATE MEDICAL CARE IF:   Pain in the groin increases suddenly.  A bulge in  the groin gets bigger suddenly and does not go down.  For men, there is sudden pain in the scrotum. Or, the size of the scrotum increases.  A bulge in the groin area becomes red or purple and is painful to touch.  You have nausea or vomiting that does not go away.  You feel your heart beating much faster than normal.  You cannot have a bowel movement or pass gas.  You develop a fever of more than 102.0 F (38.9 C).   This information is not intended to replace advice given to you by your health care provider. Make sure you discuss any questions you have with your health care provider.   Document Released: 07/04/2008 Document Revised: 05/10/2011 Document Reviewed: 08/19/2014 Elsevier Interactive Patient Education [employer].

## 2019-09-19 NOTE — H&P (View-Only) (Signed)
09/19/2019  Reason for Visit:  Recurrent right inguinal hernia and umbilical hernia  History of Present Illness: Jonathan Simon is a 58 y.o. male presenting for evaluation of a recurrent right inguinal hernia and incisional/umbilical hernia.  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 Marterre.    He presented to the ER on 7/16 with a two week history of pain.  This initially had started in his low back but then started migrating to the right groin and eventually radiating towards the right testicle.  He had a CT scan which showed a right fat containing inguinal hernia and a fat containing umbilical hernia.  Outpatient labs that same day showed a normal WBC.  He was discharged to home with close follow up with us.  Today he reports that he has some soreness that sometimes gets worse depending on his activity level.  It take a while for the discomfort to settle down.  Denies any nausea, vomiting, constipation, obstructive symptoms.  Denies any difficulty with urination.  Past Medical History: Past Medical History:  Diagnosis Date  . Acute maxillary sinusitis   . Acute upper respiratory infections of unspecified site   . Edema   . Esophageal reflux   . Essential hypertension, benign   . External hemorrhoids without mention of complication   . Other and unspecified hyperlipidemia   . Other malaise and fatigue   . Spontaneous pneumothorax    bilateral s/p surgery/pleurodesis [facility]   . Treadmill stress test negative for angina pectoris 2006   [facility]     Past Surgical History: Past Surgical History:  Procedure Laterality Date  . COLONOSCOPY WITH PROPOFOL N/A 06/25/2016   Procedure: COLONOSCOPY WITH PROPOFOL;  Surgeon:  Jeffrey W Byrnett, MD;  Location: [facility] ENDOSCOPY;  Service: Endoscopy;  Laterality: N/A;  . COLOSTOMY REVERSAL  1988  . EXPLORATORY LAPAROTOMY  1988   with loop colostomy secondary to lightning rod impalement at   . HEMORRHOID SURGERY    . INGUINAL HERNIA REPAIR Right 2012   Open, with mesh, Dr. Marterre  . LAPAROSCOPIC INGUINAL HERNIA REPAIR Left    before 2012  . THORACOTOMY Right 1987   with blebectomy and pleurodesis  . THORACOTOMY Left 1997   with blebectomy and pleurodesis    Home Medications: Prior to Admission medications   Medication Sig Start Date End Date Taking? Authorizing Provider  albuterol (PROVENTIL HFA;VENTOLIN HFA) 108 (90 BASE) MCG/ACT inhaler Inhale 2 puffs into the lungs every 6 (six) hours as needed for wheezing. 12/07/11  Yes Tullo, Teresa L, MD  amLODipine (NORVASC) 10 MG tablet Take 1 tablet (10 mg total) by mouth daily. 07/25/19  Yes Tullo, Teresa L, MD  fluticasone (VERAMYST) 27.5 MCG/SPRAY nasal spray Place 2 sprays into the nose as needed.    Yes [provider]  losartan (COZAAR) 100 MG tablet Take 1 tablet (100 mg total) by mouth daily. 07/25/19  Yes Tullo, Teresa L, MD  metoprolol succinate (TOPROL-XL) 25 MG 24 hr tablet Take 1 tablet (25 mg total) by mouth daily. 07/25/19  Yes Tullo, Teresa L, MD  Multiple Vitamin (MULTIVITAMIN) capsule Take 1 capsule by mouth daily.   Yes [provider]  niacin 500 MG tablet Take 500 mg by mouth daily with breakfast.     Yes [provider]  oxyCODONE-acetaminophen (PERCOCET) 5-325 MG tablet Take 1 tablet by mouth every 6 (six) hours as needed for severe pain. 09/14/19 09/13/20 Yes Stafford, Phillip, MD  simvastatin (ZOCOR) 20 MG tablet TAKE 1 TABLET BY MOUTH DAILY AT 6 PM 07/25/19  Yes Tullo, Teresa L, MD  triamcinolone cream (KENALOG) 0.1 %  04/28/17  Yes [provider]    Allergies: No Known Allergies  Social History:  reports that he has never smoked. He has never used  smokeless tobacco. He reports current alcohol use of about 42.0 standard drinks of alcohol per week. He reports that he does not use drugs.   Family History: Family History  Problem Relation Age of Onset  . Heart disease Father 72       Massive MI   . COPD Mother 87  . Heart disease Paternal Grandfather   . Colon cancer Neg Hx     Review of Systems: Review of Systems  Constitutional: Negative for chills and fever.  HENT: Negative for hearing loss.   Eyes: Negative for blurred vision.  Respiratory: Negative for shortness of breath.   Cardiovascular: Negative for chest pain.  Gastrointestinal: Positive for abdominal pain. Negative for constipation, diarrhea, nausea and vomiting.  Genitourinary: Negative for dysuria.  Musculoskeletal: Positive for back pain. Negative for myalgias.  Skin: Negative for rash.  Neurological: Negative for dizziness.  Psychiatric/Behavioral: Negative for depression.    Physical Exam BP (!) 146/93   Pulse 69   Temp 98.1 F (36.7 C) (Oral)   Resp 12   Ht 5' 10" (1.778 m)   Wt 209 lb (94.8 kg)   SpO2 96%   BMI 29.99 kg/m  CONSTITUTIONAL: No acute distress HEENT:  Normocephalic, atraumatic, extraocular motion intact. NECK: Trachea is midline, and there is no jugular venous distension.  RESPIRATORY:  Lungs are clear, and breath sounds are equal bilaterally. Normal respiratory effort without pathologic use of accessory muscles. CARDIOVASCULAR: Heart is regular without murmurs, gallops, or rubs. GI: The abdomen is soft, non-distended.  Has well healed midline laparotomy scar, LLQ ostomy scar, what appears to be a laparoscopic umbilical scar, and a right groin scar.  Has a small umbilical incisional hernia which is reducible with minimal discomfort.  He has a small right inguinal hernia, which is reducible, and does not extend to the scrotum.  No palpable hernia on the left side. MUSCULOSKELETAL:  Normal muscle strength and tone in all four extremities.   No peripheral edema or cyanosis. SKIN: Skin turgor is normal. There are no pathologic skin lesions.  NEUROLOGIC:  Motor and sensation is grossly normal.  Cranial nerves are grossly intact. PSYCH:  Alert and oriented to person, place and time. Affect is normal.  Laboratory Analysis: Labs on 09/14/19: WBC 4.8, Hgb 16, Hct 46.7, Plt 182.  Na 136, K 3.6, Cl 100, CO2 30.1, BUN 8, Cr 0.9.  Total bilirubin 2.2, AST 46, ALT 50, alk phos 84.  U/A with rare bacteria but no WBC.     Imaging: CT scan abdomen/pelvis w/o contrast 09/14/19: IMPRESSION: Negative for urinary tract stone. No acute abnormality abdomen or pelvis.  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  Fatty infiltration of the liver.  Small fat containing right inguinal and umbilical hernias.  Diverticulosis without diverticulitis.   Assessment and Plan: This is a 58 y.o. male with a recurrent right inguinal hernia and an incisional/umbilical hernia.  --Discussed with the patient the results from his CT scan.  Discussed with him that we can   repair both hernias in the same clinical setting, and I would recommend a robotic right inguinal hernia repair and open umbilical hernia repair.  Discussed with him the surgery at length, including risks of bleeding, infection, and injury to surrounding structures.  Discussed with him that this is an outpatient surgery, post-operative activity/lifting restrictions, pain expectations, and overall recovery. He is willing to proceed. --We will schedule him for 10/02/19.  Will obtain medical clearance as a precaution.  Discussed with him that he would also need a COVID test prior to surgery.    Face-to-face time spent with the patient and care providers was 45 minutes, with more than 50% of the time spent counseling, educating, and coordinating care of the patient.     Ishika Chesterfield Luis Nykeria Mealing, MD [facility]   

## 2019-09-20 ENCOUNTER — Telehealth: Payer: Self-pay | Admitting: Internal Medicine

## 2019-09-20 DIAGNOSIS — Z01818 Encounter for other preprocedural examination: Secondary | ICD-10-CM

## 2019-09-20 NOTE — Telephone Encounter (Signed)
LMTCB

## 2019-09-20 NOTE — Telephone Encounter (Signed)
Cary with [facility] came to deliver surgical clearance forms. Placed in colored folder up front to be completed.

## 2019-09-20 NOTE — Telephone Encounter (Signed)
OV needed for EKG/preoperative evaluation for medical clearace.  Needs LABS PRIOR TO VISIT AND EKG at visit

## 2019-09-20 NOTE — Telephone Encounter (Signed)
Placed in red folder  

## 2019-09-21 NOTE — Telephone Encounter (Signed)
Appointments made

## 2019-09-24 ENCOUNTER — Other Ambulatory Visit: Payer: Self-pay

## 2019-09-24 ENCOUNTER — Other Ambulatory Visit (INDEPENDENT_AMBULATORY_CARE_PROVIDER_SITE_OTHER): Payer: 59

## 2019-09-24 DIAGNOSIS — Z01818 Encounter for other preprocedural examination: Secondary | ICD-10-CM | POA: Diagnosis not present

## 2019-09-24 LAB — CBC WITH DIFFERENTIAL/PLATELET
Basophils Absolute: 0.1 10*3/uL (ref 0.0–0.1)
Basophils Relative: 0.9 % (ref 0.0–3.0)
Eosinophils Absolute: 0.1 10*3/uL (ref 0.0–0.7)
Eosinophils Relative: 1.8 % (ref 0.0–5.0)
HCT: 43.2 % (ref 39.0–52.0)
Hemoglobin: 14.7 g/dL (ref 13.0–17.0)
Lymphocytes Relative: 25.2 % (ref 12.0–46.0)
Lymphs Abs: 1.4 10*3/uL (ref 0.7–4.0)
MCHC: 34.1 g/dL (ref 30.0–36.0)
MCV: 92.4 fl (ref 78.0–100.0)
Monocytes Absolute: 0.9 10*3/uL (ref 0.1–1.0)
Monocytes Relative: 15.1 % — ABNORMAL HIGH (ref 3.0–12.0)
Neutro Abs: 3.3 10*3/uL (ref 1.4–7.7)
Neutrophils Relative %: 57 % (ref 43.0–77.0)
Platelets: 213 10*3/uL (ref 150.0–400.0)
RBC: 4.67 Mil/uL (ref 4.22–5.81)
RDW: 14.8 % (ref 11.5–15.5)
WBC: 5.7 10*3/uL (ref 4.0–10.5)

## 2019-09-24 LAB — BASIC METABOLIC PANEL
BUN: 9 mg/dL (ref 6–23)
CO2: 25 mEq/L (ref 19–32)
Calcium: 9.2 mg/dL (ref 8.4–10.5)
Chloride: 103 mEq/L (ref 96–112)
Creatinine, Ser: 0.85 mg/dL (ref 0.40–1.50)
GFR: 91.66 mL/min (ref >60.00)
Glucose, Bld: 95 mg/dL (ref 70–99)
Potassium: 4 mEq/L (ref 3.5–5.1)
Sodium: 137 mEq/L (ref 135–145)

## 2019-09-24 LAB — TSH: TSH: 1.03 u[IU]/mL (ref 0.35–4.50)

## 2019-09-25 ENCOUNTER — Encounter
Admission: RE | Admit: 2019-09-25 | Discharge: 2019-09-25 | Disposition: A | Discharge status: Home / Self Care | Payer: 59 | Source: Ambulatory Visit | Attending: Surgery | Admitting: Surgery

## 2019-09-25 NOTE — Patient Instructions (Addendum)
Your procedure is scheduled on: Tuesday October 02, 2019 Report to Day Surgery inside [facility] 2[facility]. To find out your arrival time please call [phone] between 1PM - 3PM on Monday October 01, 2019.  Remember: Instructions that are not followed completely may result in serious medical risk,  up to and including death, or upon the discretion of your surgeon and anesthesiologist your  surgery may need to be rescheduled.     _X__ 1. Do not eat food after midnight the night before your procedure.                 No gum chewing or hard candies. You may drink clear liquids up to 2 hours                 before you are scheduled to arrive for your surgery- DO not drink clear                 liquids within 2 hours of the start of your surgery.                 Clear Liquids include:  water, apple juice without pulp, clear Gatorade, G2 or                  Gatorade Zero (avoid Red/Purple/Blue), Black Coffee or Tea (Do not add                 anything to coffee or tea).  __X__2.  On the morning of surgery brush your teeth with toothpaste and water, you                may rinse your mouth with mouthwash if you wish.  Do not swallow any toothpaste of mouthwash.     _X__ 3.  No Alcohol for 24 hours before or after surgery.   _X__ 4.  Do Not Smoke or use e-cigarettes For 24 Hours Prior to Your Surgery.                 Do not use any chewable tobacco products for at least 6 hours prior to                 Surgery.  _X__  5.  Do not use any recreational drugs (marijuana, cocaine, heroin, ecstacy, MDMA or other)                For at least one week prior to your surgery.  Combination of these drugs with anesthesia                May have life threatening results.  __X__ 6.  Notify your doctor if there is any change in your medical condition      (cold, fever, infections).     Do not wear jewelry, make-up, hairpins, clips or nail polish. Do not wear lotions, powders, or  perfumes. You may wear deodorant. Do not shave 48 hours prior to surgery. Men may shave face and neck. Do not bring valuables to the hospital.    [facility] is not responsible for any belongings or valuables.  Contacts, dentures or bridgework may not be worn into surgery. Leave your suitcase in the car. After surgery it may be brought to your room. For patients admitted to the hospital, discharge time is determined by your treatment team.   Patients discharged the day of surgery will not be allowed to drive home.   Make arrangements for someone to be  with you for the first 24 hours of your Same Day Discharge.    __X__ Take these medicines the morning of surgery with A SIP OF WATER:    1. amLODipine (NORVASC) 10 MG  2. omeprazole (PRILOSEC) 20 MG     __X__ Use CHG Soap as directed  ___X_ Use inhaler and nasal spray on the day of surgery  albuterol (PROVENTIL HFA;VENTOLIN HFA) 108 (90 BASE) MCG/ACT inhaler (if still within date)  fluticasone (VERAMYST) 27.5 MCG/SPRAY nasal spray  __X__ Stop Anti-inflammatories such as Ibuprofen, Aleve, Advil, naproxen, aspirin, and or BC powders.    __X__ Stop supplements until after surgery.    __X__ Do not start any herbal supplements before your surgery.

## 2019-09-26 ENCOUNTER — Telehealth: Payer: Self-pay | Admitting: *Deleted

## 2019-09-26 ENCOUNTER — Telehealth: Payer: Self-pay

## 2019-09-26 ENCOUNTER — Telehealth: Payer: Self-pay | Admitting: Emergency Medicine

## 2019-09-26 ENCOUNTER — Other Ambulatory Visit: Payer: Self-pay

## 2019-09-26 ENCOUNTER — Encounter: Payer: Self-pay | Admitting: Internal Medicine

## 2019-09-26 ENCOUNTER — Ambulatory Visit (INDEPENDENT_AMBULATORY_CARE_PROVIDER_SITE_OTHER): Payer: 59 | Admitting: Internal Medicine

## 2019-09-26 ENCOUNTER — Encounter: Payer: Self-pay | Admitting: Urgent Care

## 2019-09-26 VITALS — BP 130/90 | Temp 99.0°F | Ht 70.0 in | Wt 211.0 lb

## 2019-09-26 DIAGNOSIS — K760 Fatty (change of) liver, not elsewhere classified: Secondary | ICD-10-CM

## 2019-09-26 DIAGNOSIS — E785 Hyperlipidemia, unspecified: Secondary | ICD-10-CM | POA: Diagnosis not present

## 2019-09-26 DIAGNOSIS — I10 Essential (primary) hypertension: Secondary | ICD-10-CM

## 2019-09-26 DIAGNOSIS — Z01818 Encounter for other preprocedural examination: Secondary | ICD-10-CM | POA: Diagnosis not present

## 2019-09-26 DIAGNOSIS — Z125 Encounter for screening for malignant neoplasm of prostate: Secondary | ICD-10-CM | POA: Diagnosis not present

## 2019-09-26 MED ORDER — LOSARTAN POTASSIUM-HCTZ 100-25 MG PO TABS
1.0000 | ORAL_TABLET | Freq: Every day | ORAL | 3 refills | Status: DC
Start: 2019-09-26 — End: 2020-03-14

## 2019-09-26 NOTE — Telephone Encounter (Signed)
LMTCB. Pt left office before getting his AVS so need to make sure pt knows not to take the losartan-hctz the morning of his surgery to only take the plain losartan.

## 2019-09-26 NOTE — Assessment & Plan Note (Signed)
Adding hctz to current regimen.  Advised to suspend hctz the morning of surgery and resume once discharged .    Lab Results  Component Value Date   CREATININE 0.85 09/24/2019   Lab Results  Component Value Date   NA 137 09/24/2019   K 4.0 09/24/2019   CL 103 09/24/2019   CO2 25 09/24/2019

## 2019-09-26 NOTE — Patient Instructions (Addendum)
Starting you on losartan hctz instead of taking losartan  Take it in the morning,   And take the amlodipine and metoprolol at night  Return in 3 months for your CPE   Do not take the losartan hct the morning of your surgery  Take plain losartan  That morning (save one from current bottle)

## 2019-09-26 NOTE — Telephone Encounter (Signed)
Faxed to Adela Lank at [place] at [phone]

## 2019-09-26 NOTE — Telephone Encounter (Signed)
Medical clearance received by Dr Derrel Nip. Patient is optimized for surgery at low risk.

## 2019-09-26 NOTE — Progress Notes (Signed)
Subjective:  Patient ID: Jonathan Simon, male    DOB: [birth year]  Age: 60 y.o. MRN: [id number]  CC: The primary encounter diagnosis was Hyperlipidemia with target LDL less than 100. Diagnoses of Hepatic steatosis, Prostate cancer screening, Essential hypertension, and Preoperative clearance were also pertinent to this visit.  HPI [name] presents for preoperative evaluation for laparscopic inguinal hernia repair by Dr Hampton Abbot planned for October 02 2019   This visit occurred during the SARS-CoV-2 public health emergency.  Safety protocols were in place, including screening questions prior to the visit, additional usage of staff PPE, and extensive cleaning of exam room while observing appropriate contact time as indicated for disinfecting solutions.   Patient is a 59 yr old male with hypertension, aortic atherosclerosis AND prior inguinal hernia repair who is scheduled for robotic laparoscopic right inguinal hernia repair on August 3 20212 by Dr Hampton Abbot under general anaethesia .  He t with no history of chest pain,  Diabetes or kidney disease CBC. BMET and TSH all normal   EKG done :  Compared to 3/19 EKG:  No significant changes   Hypertension:  Home readings have been 321 systolic with diastolics consistently 85 to 90 on maximal doses of metoprolol , losartan and amlodipine.  CT results reviewed with patient:  Fat filled inguinal and umbilical  hernias noted,  Prior left inguinal hernia repair, hepatic steatosis discussed,  Emphysematous changes to lower lobes noted, calcified placques In the right lung,  And sigmoid diverticulosis    Outpatient Medications Prior to Visit  Medication Sig Dispense Refill  . albuterol (PROVENTIL HFA;VENTOLIN HFA) 108 (90 BASE) MCG/ACT inhaler Inhale 2 puffs into the lungs every 6 (six) hours as needed for wheezing. 1 Inhaler 0  . amLODipine (NORVASC) 10 MG tablet Take 1 tablet (10 mg total) by mouth daily. 30 tablet 5  . fluticasone (VERAMYST)  27.5 MCG/SPRAY nasal spray Place 2 sprays into the nose daily as needed for allergies.     . metoprolol succinate (TOPROL-XL) 25 MG 24 hr tablet Take 1 tablet (25 mg total) by mouth daily. 30 tablet 5  . Multiple Vitamin (MULTIVITAMIN) capsule Take 1 capsule by mouth daily.    . niacin 500 MG tablet Take 500 mg by mouth daily with breakfast.      . Omega-3 Fatty Acids (FISH OIL) 1200 MG CAPS Take 1,200 mg by mouth daily.    Marland Kitchen omeprazole (PRILOSEC) 20 MG capsule Take 20 mg by mouth daily.    Marland Kitchen oxyCODONE-acetaminophen (PERCOCET) 5-325 MG tablet Take 1 tablet by mouth every 6 (six) hours as needed for severe pain. 8 tablet 0  . simvastatin (ZOCOR) 20 MG tablet TAKE 1 TABLET BY MOUTH DAILY AT 6 PM (Patient taking differently: Take 10 mg by mouth daily at 6 PM. TAKE 1 TABLET BY MOUTH DAILY AT 6 PM) 30 tablet 5  . triamcinolone cream (KENALOG) 0.1 % Apply 1 application topically daily as needed (rash).     Marland Kitchen losartan (COZAAR) 100 MG tablet Take 1 tablet (100 mg total) by mouth daily. 30 tablet 5   No facility-administered medications prior to visit.    Review of Systems;  Patient denies headache, fevers, malaise, unintentional weight loss, skin rash, eye pain, sinus congestion and sinus pain, sore throat, dysphagia,  hemoptysis , cough, dyspnea, wheezing, chest pain, palpitations, orthopnea, edema, abdominal pain, nausea, melena, diarrhea, constipation, flank pain, dysuria, hematuria, urinary  Frequency, nocturia, numbness, tingling, seizures,  Focal weakness, Loss of consciousness,  Tremor,  insomnia, depression, anxiety, and suicidal ideation.      Objective:  BP (!) 130/90 (BP Location: Left Arm, Patient Position: Sitting)   Temp 99 F (37.2 C)   Ht 5\' 10"  (1.778 m)   Wt (!) 211 lb (95.7 kg)   BMI 30.28 kg/m   BP Readings from Last 3 Encounters:  09/26/19 (!) 130/90  09/19/19 (!) 146/93  09/14/19 (!) 135/100    Wt Readings from Last 3 Encounters:  09/26/19 (!) 211 lb (95.7 kg)   09/25/19 (!) 205 lb (93 kg)  09/19/19 209 lb (94.8 kg)    General appearance: alert, cooperative and appears stated age Ears: normal TM's and external ear canals both ears Throat: lips, mucosa, and tongue normal; teeth and gums normal Neck: no adenopathy, no carotid bruit, supple, symmetrical, trachea midline and thyroid not enlarged, symmetric, no tenderness/mass/nodules Back: symmetric, no curvature. ROM normal. No CVA tenderness. Lungs: clear to auscultation bilaterally Heart: regular rate and rhythm, S1, S2 normal, no murmur, click, rub or gallop Abdomen: soft, non-tender; bowel sounds normal; no masses,  no organomegaly Pulses: 2+ and symmetric Skin: Skin color, texture, turgor normal. No rashes or lesions Lymph nodes: Cervical, supraclavicular, and axillary nodes normal.  Lab Results  Component Value Date   HGBA1C 5.2 04/22/2016    Lab Results  Component Value Date   CREATININE 0.85 09/24/2019   CREATININE 0.97 06/06/2019   CREATININE 0.90 01/23/2019    Lab Results  Component Value Date   WBC 5.7 09/24/2019   HGB 14.7 09/24/2019   HCT 43.2 09/24/2019   PLT 213.0 09/24/2019   GLUCOSE 95 09/24/2019   CHOL 153 06/06/2019   TRIG 105.0 06/06/2019   HDL 45.50 06/06/2019   LDLDIRECT 88.0 05/02/2017   LDLCALC 87 06/06/2019   ALT 42 06/06/2019   AST 38 (H) 06/06/2019   NA 137 09/24/2019   K 4.0 09/24/2019   CL 103 09/24/2019   CREATININE 0.85 09/24/2019   BUN 9 09/24/2019   CO2 25 09/24/2019   TSH 1.03 09/24/2019   PSA 0.79 07/07/2018   HGBA1C 5.2 04/22/2016    No results found.  Assessment & Plan:   Problem List Items Addressed This Visit      Unprioritized   Preoperative clearance      I have ordered and reviewed a 12 lead EKG and find that there are no acute changes and patient is in sinus rhythm.  He is considered low risk for perioperative complications      Hypertension (Chronic)    Adding hctz to current regimen.  Advised to suspend hctz the  morning of surgery and resume once discharged .    Lab Results  Component Value Date   CREATININE 0.85 09/24/2019   Lab Results  Component Value Date   NA 137 09/24/2019   K 4.0 09/24/2019   CL 103 09/24/2019   CO2 25 09/24/2019         Relevant Medications   losartan-hydrochlorothiazide (HYZAAR) 100-25 MG tablet   Hyperlipidemia with target LDL less than 100 - Primary (Chronic)   Relevant Medications   losartan-hydrochlorothiazide (HYZAAR) 100-25 MG tablet   Other Relevant Orders   Lipid panel   Hepatic steatosis    Fatty infiltration noted on CT abd/pelvis.  Discussed with patient. Low GI diet,  Alcohol restriction advised.  He is already vaccinated against Hep A/B and takes a statin.       Relevant Orders   Comprehensive metabolic panel    Other Visit Diagnoses  Prostate cancer screening       Relevant Orders   PSA     I provided  40 minutes of  face-to-face time during this encounter reviewing patient's current problems and past surgeries, labs and imaging studies, providing counseling on the above mentioned problems , and coordination  of care .  I have discontinued Armend J. Alvis's losartan. I am also having him start on losartan-hydrochlorothiazide. Additionally, I am having him maintain his fluticasone, niacin, albuterol, multivitamin, triamcinolone cream, amLODipine, metoprolol succinate, simvastatin, oxyCODONE-acetaminophen, Fish Oil, and omeprazole.  Meds ordered this encounter  Medications  . losartan-hydrochlorothiazide (HYZAAR) 100-25 MG tablet    Sig: Take 1 tablet by mouth daily.    Dispense:  90 tablet    Refill:  3    Medications Discontinued During This Encounter  Medication Reason  . losartan (COZAAR) 100 MG tablet     Follow-up: Return in about 3 months (around 12/27/2019).   Crecencio Mc, MD

## 2019-09-26 NOTE — Assessment & Plan Note (Signed)
Fatty infiltration noted on CT abd/pelvis.  Discussed with patient. Low GI diet,  Alcohol restriction advised.  He is already vaccinated against Hep A/B and takes a statin.

## 2019-09-26 NOTE — Assessment & Plan Note (Signed)
I have ordered and reviewed a 12 lead EKG and find that there are no acute changes and patient is in sinus rhythm.  He is considered low risk for perioperative complications

## 2019-09-27 NOTE — Telephone Encounter (Signed)
Spoke with pt and verified that he understood the message given to him below. Pt gave a verbal understanding.

## 2019-09-27 NOTE — Telephone Encounter (Signed)
Pt returned your call. I gave him the message below but let him know that I am not clinical and I would get you to give him a call back.

## 2019-09-28 ENCOUNTER — Other Ambulatory Visit
Admission: RE | Admit: 2019-09-28 | Discharge: 2019-09-28 | Disposition: A | Discharge status: Home / Self Care | Payer: 59 | Source: Ambulatory Visit | Attending: Surgery | Admitting: Surgery

## 2019-09-28 ENCOUNTER — Other Ambulatory Visit: Payer: Self-pay

## 2019-09-28 DIAGNOSIS — Z20822 Contact with and (suspected) exposure to covid-19: Secondary | ICD-10-CM | POA: Diagnosis not present

## 2019-09-28 DIAGNOSIS — Z01812 Encounter for preprocedural laboratory examination: Secondary | ICD-10-CM | POA: Insufficient documentation

## 2019-09-28 LAB — SARS CORONAVIRUS 2 (TAT 6-24 HRS): SARS Coronavirus 2: NEGATIVE

## 2019-10-01 MED ORDER — ACETAMINOPHEN 500 MG PO TABS: 1000.0000 mg | ORAL_TABLET | ORAL | Status: AC

## 2019-10-01 MED ORDER — CHLORHEXIDINE GLUCONATE 0.12 % MT SOLN: 15.0000 mL | Freq: Once | OROMUCOSAL | Status: AC

## 2019-10-01 MED ORDER — ORAL CARE MOUTH RINSE: 15.0000 mL | Freq: Once | OROMUCOSAL | Status: AC

## 2019-10-01 MED ORDER — LACTATED RINGERS IV SOLN: INTRAVENOUS | Status: DC

## 2019-10-01 MED ORDER — CEFAZOLIN SODIUM-DEXTROSE 2-4 GM/100ML-% IV SOLN
2.0000 g | INTRAVENOUS | Status: AC
  Administered 2019-10-02: 2 g via INTRAVENOUS

## 2019-10-01 MED ORDER — GABAPENTIN 300 MG PO CAPS: 300.0000 mg | ORAL_CAPSULE | ORAL | Status: AC

## 2019-10-01 MED ORDER — BUPIVACAINE LIPOSOME 1.3 % IJ SUSP: 20.0000 mL | Freq: Once | INTRAMUSCULAR | Status: DC

## 2019-10-02 ENCOUNTER — Other Ambulatory Visit: Payer: Self-pay

## 2019-10-02 ENCOUNTER — Encounter
Admission: RE | Disposition: A | Discharge status: Home / Self Care | Payer: Self-pay | Source: Home / Self Care | Attending: Surgery

## 2019-10-02 ENCOUNTER — Ambulatory Visit: Payer: 59 | Admitting: Anesthesiology

## 2019-10-02 ENCOUNTER — Ambulatory Visit
Admission: RE | Admit: 2019-10-02 | Discharge: 2019-10-02 | Disposition: A | Discharge status: Home / Self Care | Payer: 59 | Attending: Surgery | Admitting: Surgery

## 2019-10-02 ENCOUNTER — Encounter: Payer: Self-pay | Admitting: Surgery

## 2019-10-02 DIAGNOSIS — E785 Hyperlipidemia, unspecified: Secondary | ICD-10-CM | POA: Diagnosis not present

## 2019-10-02 DIAGNOSIS — Z79899 Other long term (current) drug therapy: Secondary | ICD-10-CM | POA: Diagnosis not present

## 2019-10-02 DIAGNOSIS — K4091 Unilateral inguinal hernia, without obstruction or gangrene, recurrent: Secondary | ICD-10-CM | POA: Diagnosis not present

## 2019-10-02 DIAGNOSIS — I10 Essential (primary) hypertension: Secondary | ICD-10-CM | POA: Diagnosis not present

## 2019-10-02 DIAGNOSIS — K760 Fatty (change of) liver, not elsewhere classified: Secondary | ICD-10-CM | POA: Diagnosis not present

## 2019-10-02 DIAGNOSIS — Z8249 Family history of ischemic heart disease and other diseases of the circulatory system: Secondary | ICD-10-CM | POA: Insufficient documentation

## 2019-10-02 DIAGNOSIS — K432 Incisional hernia without obstruction or gangrene: Secondary | ICD-10-CM | POA: Insufficient documentation

## 2019-10-02 HISTORY — PX: XI ROBOTIC ASSISTED INGUINAL HERNIA REPAIR WITH MESH: SHX6706

## 2019-10-02 HISTORY — PX: INCISIONAL HERNIA REPAIR: SHX193

## 2019-10-02 SURGERY — REPAIR, HERNIA, INGUINAL, ROBOT-ASSISTED, LAPAROSCOPIC, USING MESH
Anesthesia: General | Laterality: Right

## 2019-10-02 MED ORDER — FENTANYL CITRATE (PF) 100 MCG/2ML IJ SOLN
INTRAMUSCULAR | Status: AC
  Filled 2019-10-02: qty 2

## 2019-10-02 MED ORDER — SODIUM CHLORIDE 0.9 % IV SOLN
INTRAVENOUS | Status: DC | PRN
  Administered 2019-10-02: 50 ug/min via INTRAVENOUS

## 2019-10-02 MED ORDER — CHLORHEXIDINE GLUCONATE CLOTH 2 % EX PADS: 6.0000 | MEDICATED_PAD | Freq: Once | CUTANEOUS | Status: DC

## 2019-10-02 MED ORDER — OXYCODONE HCL 5 MG PO TABS
ORAL_TABLET | ORAL | Status: AC
  Administered 2019-10-02: 5 mg via ORAL
  Filled 2019-10-02: qty 1

## 2019-10-02 MED ORDER — GLYCOPYRROLATE 0.2 MG/ML IJ SOLN
INTRAMUSCULAR | Status: AC
  Filled 2019-10-02: qty 1

## 2019-10-02 MED ORDER — KETOROLAC TROMETHAMINE 30 MG/ML IJ SOLN
INTRAMUSCULAR | Status: DC | PRN
  Administered 2019-10-02: 30 mg via INTRAVENOUS

## 2019-10-02 MED ORDER — EPHEDRINE SULFATE 50 MG/ML IJ SOLN
INTRAMUSCULAR | Status: DC | PRN
  Administered 2019-10-02: 10 mg via INTRAVENOUS
  Administered 2019-10-02: 5 mg via INTRAVENOUS
  Administered 2019-10-02: 10 mg via INTRAVENOUS

## 2019-10-02 MED ORDER — PROMETHAZINE HCL 25 MG/ML IJ SOLN: 6.2500 mg | INTRAMUSCULAR | Status: DC | PRN

## 2019-10-02 MED ORDER — BUPIVACAINE-EPINEPHRINE (PF) 0.25% -1:200000 IJ SOLN
INTRAMUSCULAR | Status: AC
  Filled 2019-10-02: qty 30

## 2019-10-02 MED ORDER — VASOPRESSIN 20 UNIT/ML IV SOLN
INTRAVENOUS | Status: AC
  Filled 2019-10-02: qty 1

## 2019-10-02 MED ORDER — PROPOFOL 500 MG/50ML IV EMUL
INTRAVENOUS | Status: AC
  Filled 2019-10-02: qty 50

## 2019-10-02 MED ORDER — IBUPROFEN 600 MG PO TABS
600.0000 mg | ORAL_TABLET | Freq: Three times a day (TID) | ORAL | 1 refills | Status: DC | PRN
Start: 2019-10-02 — End: 2019-10-17

## 2019-10-02 MED ORDER — ACETAMINOPHEN 500 MG PO TABS
ORAL_TABLET | ORAL | Status: AC
  Administered 2019-10-02: 1000 mg via ORAL
  Filled 2019-10-02: qty 2

## 2019-10-02 MED ORDER — LIDOCAINE HCL (PF) 2 % IJ SOLN
INTRAMUSCULAR | Status: AC
  Filled 2019-10-02: qty 5

## 2019-10-02 MED ORDER — SUGAMMADEX SODIUM 200 MG/2ML IV SOLN
INTRAVENOUS | Status: DC | PRN
  Administered 2019-10-02: 250 mg via INTRAVENOUS

## 2019-10-02 MED ORDER — PROPOFOL 10 MG/ML IV BOLUS
INTRAVENOUS | Status: DC | PRN
  Administered 2019-10-02: 200 mg via INTRAVENOUS

## 2019-10-02 MED ORDER — OXYCODONE HCL 5 MG PO TABS: 5.0000 mg | ORAL_TABLET | ORAL | 0 refills | Status: DC | PRN

## 2019-10-02 MED ORDER — ROCURONIUM BROMIDE 10 MG/ML (PF) SYRINGE
PREFILLED_SYRINGE | INTRAVENOUS | Status: AC
  Filled 2019-10-02: qty 10

## 2019-10-02 MED ORDER — VASOPRESSIN 20 UNIT/ML IV SOLN
INTRAVENOUS | Status: DC | PRN
  Administered 2019-10-02 (×2): 2 [IU] via INTRAVENOUS

## 2019-10-02 MED ORDER — GABAPENTIN 300 MG PO CAPS
ORAL_CAPSULE | ORAL | Status: AC
  Administered 2019-10-02: 300 mg via ORAL
  Filled 2019-10-02: qty 1

## 2019-10-02 MED ORDER — IBUPROFEN 600 MG PO TABS
ORAL_TABLET | ORAL | Status: AC
  Filled 2019-10-02: qty 1

## 2019-10-02 MED ORDER — MIDAZOLAM HCL 2 MG/2ML IJ SOLN
INTRAMUSCULAR | Status: AC
  Filled 2019-10-02: qty 2

## 2019-10-02 MED ORDER — LIDOCAINE HCL (CARDIAC) PF 100 MG/5ML IV SOSY
PREFILLED_SYRINGE | INTRAVENOUS | Status: DC | PRN
  Administered 2019-10-02: 100 mg via INTRAVENOUS

## 2019-10-02 MED ORDER — IBUPROFEN 600 MG PO TABS
600.0000 mg | ORAL_TABLET | Freq: Three times a day (TID) | ORAL | Status: DC | PRN
  Administered 2019-10-02: 600 mg via ORAL
  Filled 2019-10-02: qty 1

## 2019-10-02 MED ORDER — OXYCODONE HCL 5 MG PO TABS
5.0000 mg | ORAL_TABLET | Freq: Once | ORAL | Status: AC
  Administered 2019-10-02: 5 mg via ORAL

## 2019-10-02 MED ORDER — BUPIVACAINE LIPOSOME 1.3 % IJ SUSP
INTRAMUSCULAR | Status: DC | PRN
  Administered 2019-10-02: 20 mL

## 2019-10-02 MED ORDER — FENTANYL CITRATE (PF) 100 MCG/2ML IJ SOLN
25.0000 ug | INTRAMUSCULAR | Status: DC | PRN
  Administered 2019-10-02: 50 ug via INTRAVENOUS

## 2019-10-02 MED ORDER — ONDANSETRON HCL 4 MG/2ML IJ SOLN
INTRAMUSCULAR | Status: DC | PRN
  Administered 2019-10-02: 4 mg via INTRAVENOUS

## 2019-10-02 MED ORDER — GLYCOPYRROLATE 0.2 MG/ML IJ SOLN
INTRAMUSCULAR | Status: DC | PRN
  Administered 2019-10-02: .1 mg via INTRAVENOUS

## 2019-10-02 MED ORDER — MIDAZOLAM HCL 2 MG/2ML IJ SOLN
INTRAMUSCULAR | Status: DC | PRN
  Administered 2019-10-02: 2 mg via INTRAVENOUS

## 2019-10-02 MED ORDER — DEXAMETHASONE SODIUM PHOSPHATE 10 MG/ML IJ SOLN
INTRAMUSCULAR | Status: DC | PRN
  Administered 2019-10-02: 10 mg via INTRAVENOUS

## 2019-10-02 MED ORDER — FENTANYL CITRATE (PF) 250 MCG/5ML IJ SOLN
INTRAMUSCULAR | Status: AC
  Filled 2019-10-02: qty 5

## 2019-10-02 MED ORDER — FENTANYL CITRATE (PF) 100 MCG/2ML IJ SOLN
INTRAMUSCULAR | Status: DC | PRN
  Administered 2019-10-02: 100 ug via INTRAVENOUS
  Administered 2019-10-02 (×2): 50 ug via INTRAVENOUS
  Administered 2019-10-02: 100 ug via INTRAVENOUS
  Administered 2019-10-02: 50 ug via INTRAVENOUS

## 2019-10-02 MED ORDER — PHENYLEPHRINE HCL (PRESSORS) 10 MG/ML IV SOLN
INTRAVENOUS | Status: DC | PRN
  Administered 2019-10-02 (×2): 100 ug via INTRAVENOUS

## 2019-10-02 MED ORDER — BUPIVACAINE-EPINEPHRINE 0.25% -1:200000 IJ SOLN
INTRAMUSCULAR | Status: DC | PRN
  Administered 2019-10-02: 30 mL

## 2019-10-02 MED ORDER — OXYCODONE HCL 5 MG PO TABS
ORAL_TABLET | ORAL | Status: AC
  Filled 2019-10-02: qty 1

## 2019-10-02 MED ORDER — KETOROLAC TROMETHAMINE 30 MG/ML IJ SOLN
INTRAMUSCULAR | Status: AC
  Filled 2019-10-02: qty 1

## 2019-10-02 MED ORDER — CHLORHEXIDINE GLUCONATE 0.12 % MT SOLN
OROMUCOSAL | Status: AC
  Administered 2019-10-02: 15 mL via OROMUCOSAL
  Filled 2019-10-02: qty 15

## 2019-10-02 MED ORDER — ONDANSETRON HCL 4 MG/2ML IJ SOLN
INTRAMUSCULAR | Status: AC
  Filled 2019-10-02: qty 2

## 2019-10-02 MED ORDER — EPHEDRINE 5 MG/ML INJ
INTRAVENOUS | Status: AC
  Filled 2019-10-02: qty 10

## 2019-10-02 MED ORDER — ROCURONIUM BROMIDE 100 MG/10ML IV SOLN
INTRAVENOUS | Status: DC | PRN
  Administered 2019-10-02: 50 mg via INTRAVENOUS
  Administered 2019-10-02: 20 mg via INTRAVENOUS
  Administered 2019-10-02: 10 mg via INTRAVENOUS

## 2019-10-02 MED ORDER — DEXAMETHASONE SODIUM PHOSPHATE 10 MG/ML IJ SOLN
INTRAMUSCULAR | Status: AC
  Filled 2019-10-02: qty 1

## 2019-10-02 MED ORDER — OXYCODONE HCL 5 MG PO TABS: 5.0000 mg | ORAL_TABLET | Freq: Once | ORAL | Status: AC

## 2019-10-02 MED ORDER — CEFAZOLIN SODIUM-DEXTROSE 2-4 GM/100ML-% IV SOLN
INTRAVENOUS | Status: AC
  Filled 2019-10-02: qty 100

## 2019-10-02 SURGICAL SUPPLY — 68 items
ADH SKN CLS APL DERMABOND .7 (GAUZE/BANDAGES/DRESSINGS) ×2
APL PRP STRL LF DISP 70% ISPRP (MISCELLANEOUS) ×2
CANISTER SUCT 1200ML W/VALVE (MISCELLANEOUS) ×4 IMPLANT
CANNULA REDUC XI 12-8 STAPL (CANNULA) ×1
CANNULA REDUC XI 12-8MM STAPL (CANNULA) ×1
CANNULA REDUCER 12-8 DVNC XI (CANNULA) ×2 IMPLANT
CHLORAPREP W/TINT 26 (MISCELLANEOUS) ×4 IMPLANT
COVER TIP SHEARS 8 DVNC (MISCELLANEOUS) ×2 IMPLANT
COVER TIP SHEARS 8MM DA VINCI (MISCELLANEOUS) ×2
COVER WAND RF STERILE (DRAPES) ×4 IMPLANT
DEFOGGER SCOPE WARMER CLEARIFY (MISCELLANEOUS) ×4 IMPLANT
DERMABOND ADVANCED (GAUZE/BANDAGES/DRESSINGS) ×2
DERMABOND ADVANCED .7 DNX12 (GAUZE/BANDAGES/DRESSINGS) ×2 IMPLANT
DRAPE ARM DVNC X/XI (DISPOSABLE) ×8 IMPLANT
DRAPE COLUMN DVNC XI (DISPOSABLE) ×2 IMPLANT
DRAPE DA VINCI XI ARM (DISPOSABLE) ×8
DRAPE DA VINCI XI COLUMN (DISPOSABLE) ×2
DRAPE LAPAROTOMY 100X77 ABD (DRAPES) IMPLANT
ELECT CAUTERY BLADE 6.4 (BLADE) ×4 IMPLANT
ELECT REM PT RETURN 9FT ADLT (ELECTROSURGICAL) ×4
ELECTRODE REM PT RTRN 9FT ADLT (ELECTROSURGICAL) ×2 IMPLANT
GLOVE PROTEXIS LATEX SZ 7.5 (GLOVE) ×4 IMPLANT
GLOVE SURG SYN 7.0 (GLOVE) ×8 IMPLANT
GLOVE SURG SYN 7.5  E (GLOVE) ×4
GLOVE SURG SYN 7.5 E (GLOVE) ×4 IMPLANT
GOWN STRL REUS W/ TWL LRG LVL3 (GOWN DISPOSABLE) ×8 IMPLANT
GOWN STRL REUS W/TWL LRG LVL3 (GOWN DISPOSABLE) ×16
IRRIGATION STRYKERFLOW (MISCELLANEOUS) IMPLANT
IRRIGATOR STRYKERFLOW (MISCELLANEOUS)
IV NS 1000ML (IV SOLUTION)
IV NS 1000ML BAXH (IV SOLUTION) IMPLANT
KIT PINK PAD W/HEAD ARE REST (MISCELLANEOUS) ×4
KIT PINK PAD W/HEAD ARM REST (MISCELLANEOUS) ×2 IMPLANT
LABEL OR SOLS (LABEL) ×4 IMPLANT
MESH 3DMAX 4X6 RT LRG (Mesh General) ×4 IMPLANT
NEEDLE HYPO 22GX1.5 SAFETY (NEEDLE) ×4 IMPLANT
NEEDLE INSUFFLATION 14GA 120MM (NEEDLE) ×4 IMPLANT
NS IRRIG 500ML POUR BTL (IV SOLUTION) ×4 IMPLANT
OBTURATOR OPTICAL STANDARD 8MM (TROCAR) ×2
OBTURATOR OPTICAL STND 8 DVNC (TROCAR) ×2
OBTURATOR OPTICALSTD 8 DVNC (TROCAR) ×2 IMPLANT
PACK BASIN MINOR (MISCELLANEOUS) IMPLANT
PACK LAP CHOLECYSTECTOMY (MISCELLANEOUS) ×4 IMPLANT
PENCIL ELECTRO HAND CTR (MISCELLANEOUS) ×4 IMPLANT
SEAL CANN UNIV 5-8 DVNC XI (MISCELLANEOUS) ×6 IMPLANT
SEAL XI 5MM-8MM UNIVERSAL (MISCELLANEOUS) ×6
SET TUBE SMOKE EVAC HIGH FLOW (TUBING) ×4 IMPLANT
SOLUTION ELECTROLUBE (MISCELLANEOUS) ×4 IMPLANT
SPONGE LAP 18X18 RF (DISPOSABLE) ×4 IMPLANT
STAPLER CANNULA SEAL DVNC XI (STAPLE) ×2 IMPLANT
STAPLER CANNULA SEAL XI (STAPLE) ×2
SUT ETHIBOND 0 (SUTURE) IMPLANT
SUT ETHIBOND 0 MO6 C/R (SUTURE) IMPLANT
SUT MNCRL AB 4-0 PS2 18 (SUTURE) ×4 IMPLANT
SUT PROLENE 2 0 SH DA (SUTURE) IMPLANT
SUT SILK 0 (SUTURE)
SUT SILK 0 30XBRD TIE 6 (SUTURE) IMPLANT
SUT SILK 0 SH 30 (SUTURE) IMPLANT
SUT VIC AB 0 SH 27 (SUTURE) IMPLANT
SUT VIC AB 2-0 SH 27 (SUTURE) ×4
SUT VIC AB 2-0 SH 27XBRD (SUTURE) ×2 IMPLANT
SUT VIC AB 3-0 SH 27 (SUTURE)
SUT VIC AB 3-0 SH 27X BRD (SUTURE) IMPLANT
SUT VICRYL 0 AB UR-6 (SUTURE) ×4 IMPLANT
SUT VLOC 90 S/L VL9 GS22 (SUTURE) ×12 IMPLANT
SYR 10ML LL (SYRINGE) ×4 IMPLANT
TAPE TRANSPORE STRL 2 31045 (GAUZE/BANDAGES/DRESSINGS) ×4 IMPLANT
TROCAR BALLN GELPORT 12X130M (ENDOMECHANICALS) ×4 IMPLANT

## 2019-10-02 NOTE — Anesthesia Procedure Notes (Signed)
Procedure Name: Intubation Date/Time: 10/02/2019 7:36 AM Performed by: Genevie Ann, CRNA Pre-anesthesia Checklist: Patient identified, Emergency Drugs available, Suction available, Patient being monitored and Timeout performed Patient Re-evaluated:Patient Re-evaluated prior to induction Oxygen Delivery Method: Circle system utilized Preoxygenation: Pre-oxygenation with 100% oxygen Induction Type: IV induction Ventilation: Mask ventilation without difficulty Laryngoscope Size: McGraph and 4 Grade View: Grade I Tube size: 7.5 mm Number of attempts: 1 Airway Equipment and Method: Stylet Secured at: 22 cm Dental Injury: Teeth and Oropharynx as per pre-operative assessment

## 2019-10-02 NOTE — Anesthesia Preprocedure Evaluation (Signed)
Anesthesia Evaluation  Patient identified by MRN, date of birth, ID band Patient awake    Reviewed: Allergy & Precautions, H&P , NPO status , Patient's Chart, lab work & pertinent test results, reviewed documented beta blocker date and time   History of Anesthesia Complications Negative for: history of anesthetic complications  Airway Mallampati: II  TM Distance: >3 FB Neck ROM: full    Dental  (+) Dental Advidsory Given, Missing, Teeth Intact   Pulmonary neg pulmonary ROS,    Pulmonary exam normal        Cardiovascular Exercise Tolerance: Good hypertension, (-) angina(-) CAD, (-) Past MI, (-) Cardiac Stents and (-) CABG Normal cardiovascular exam(-) dysrhythmias (-) Valvular Problems/Murmurs     Neuro/Psych negative neurological ROS  negative psych ROS   GI/Hepatic Neg liver ROS, GERD  ,  Endo/Other  negative endocrine ROS  Renal/GU negative Renal ROS  negative genitourinary   Musculoskeletal   Abdominal   Peds  Hematology negative hematology ROS (+)   Anesthesia Other Findings Past Medical History: No date: Acute maxillary sinusitis No date: Acute upper respiratory infections of unspecif* No date: Edema No date: Esophageal reflux No date: Essential hypertension, benign No date: External hemorrhoids without mention of compli* No date: Other and unspecified hyperlipidemia No date: Other malaise and fatigue No date: Spontaneous pneumothorax     Comment: bilateral s/p surgery/pleurodesis [facility]  2006: Treadmill stress test negative for angina pect*     Comment: [facility]   Reproductive/Obstetrics negative OB ROS                            Anesthesia Physical  Anesthesia Plan  ASA: II  Anesthesia Plan: General   Post-op Pain Management:    Induction: Intravenous  PONV Risk Score and Plan: 2 and Ondansetron, Dexamethasone, Midazolam, Promethazine and Treatment may vary due to age or  medical condition  Airway Management Planned: Oral ETT  Additional Equipment:   Intra-op Plan:   Post-operative Plan: Extubation in OR  Informed Consent: I have reviewed the patients History and Physical, chart, labs and discussed the procedure including the risks, benefits and alternatives for the proposed anesthesia with the patient or authorized representative who has indicated his/her understanding and acceptance.     Dental Advisory Given  Plan Discussed with: Anesthesiologist, CRNA and Surgeon  Anesthesia Plan Comments:         Anesthesia Quick Evaluation

## 2019-10-02 NOTE — Transfer of Care (Addendum)
Immediate Anesthesia Transfer of Care Note  Patient: Jonathan Simon  Procedure(s) Performed: XI ROBOTIC ASSISTED INGUINAL HERNIA REPAIR WITH MESH (Right ) OPEN HERNIA REPAIR INCISIONAL, umbilical (N/A )  Patient Location: PACU  Anesthesia Type:General  Level of Consciousness: awake, alert  and oriented  Airway & Oxygen Therapy: Patient Spontanous Breathing and Patient connected to nasal cannula oxygen  Post-op Assessment: Report given to RN and Post -op Vital signs reviewed and stable  Post vital signs: Reviewed and stable  Last Vitals:  Vitals Value Taken Time  BP 113/74 10/02/19 1044  Temp    Pulse 73 10/02/19 1046  Resp 16 10/02/19 1046  SpO2 94 % 10/02/19 1046  Vitals shown include unvalidated device data.  Last Pain:  Vitals:   10/02/19 0610  TempSrc: Temporal  PainSc: 5       Patients Stated Pain Goal: 3 (33/61/22 4497)  Complications: No complications documented.

## 2019-10-02 NOTE — Discharge Instructions (Addendum)
Bupivacaine Liposomal Suspension for Injection °What is this medicine? °BUPIVACAINE LIPOSOMAL (bue PIV a kane LIP oh som al) is an anesthetic. It causes loss of feeling in the skin or other tissues. It is used to prevent and to treat pain from some procedures. °This medicine may be used for other purposes; ask your health care provider or pharmacist if you have questions. °COMMON BRAND NAME(S): EXPAREL °What should I tell my health care provider before I take this medicine? °They need to know if you have any of these conditions: °· G6PD deficiency °· heart disease °· kidney disease °· liver disease °· low blood pressure °· lung or breathing disease, like asthma °· an unusual or allergic reaction to bupivacaine, other medicines, foods, dyes, or preservatives °· pregnant or trying to get pregnant °· breast-feeding °How should I use this medicine? °This medicine is for injection into the affected area. It is given by a health care professional in a hospital or clinic setting. °Talk to your pediatrician regarding the use of this medicine in children. Special care may be needed. °Overdosage: If you think you have taken too much of this medicine contact a poison control center or emergency room at once. °NOTE: This medicine is only for you. Do not share this medicine with others. °What if I miss a dose? °This does not apply. °What may interact with this medicine? °This medicine may interact with the following medications: °· acetaminophen °· certain antibiotics like dapsone, nitrofurantoin, aminosalicylic acid, sulfonamides °· certain medicines for seizures like phenobarbital, phenytoin, valproic acid °· chloroquine °· cyclophosphamide °· flutamide °· hydroxyurea °· ifosfamide °· metoclopramide °· nitric oxide °· nitroglycerin °· nitroprusside °· nitrous oxide °· other local anesthetics like lidocaine, pramoxine, tetracaine °· primaquine °· quinine °· rasburicase °· sulfasalazine °This list may not describe all possible  interactions. Give your health care provider a list of all the medicines, herbs, non-prescription drugs, or dietary supplements you use. Also tell them if you smoke, drink alcohol, or use illegal drugs. Some items may interact with your medicine. °What should I watch for while using this medicine? °Your condition will be monitored carefully while you are receiving this medicine. °Be careful to avoid injury while the area is numb, and you are not aware of pain. °What side effects may I notice from receiving this medicine? °Side effects that you should report to your doctor or health care professional as soon as possible: °· allergic reactions like skin rash, itching or hives, swelling of the face, lips, or tongue °· seizures °· signs and symptoms of a dangerous change in heartbeat or heart rhythm like chest pain; dizziness; fast, irregular heartbeat; palpitations; feeling faint or lightheaded; falls; breathing problems °· signs and symptoms of methemoglobinemia such as pale, gray, or blue colored skin; headache; fast heartbeat; shortness of breath; feeling faint or lightheaded, falls; tiredness °Side effects that usually do not require medical attention (report to your doctor or health care professional if they continue or are bothersome): °· anxious °· back pain °· changes in taste °· changes in vision °· constipation °· dizziness °· fever °· nausea, vomiting °This list may not describe all possible side effects. Call your doctor for medical advice about side effects. You may report side effects to FDA at 1-800-FDA-1088. °Where should I keep my medicine? °This drug is given in a hospital or clinic and will not be stored at home. °NOTE: This sheet is a summary. It may not cover all possible information. If you have questions about this   medicine, talk to your doctor, pharmacist, or health care provider. °© 2020 [employer] (2018-11-28 10:48:23) ° ° °AMBULATORY SURGERY  °DISCHARGE INSTRUCTIONS ° ° °1) The  drugs that you were given will stay in your system until tomorrow so for the next 24 hours you should not: ° °A) Drive an automobile °B) Make any legal decisions °C) Drink any alcoholic beverage ° ° °2) You may resume regular meals tomorrow.  Today it is better to start with liquids and gradually work up to solid foods. ° °You may eat anything you prefer, but it is better to start with liquids, then soup and crackers, and gradually work up to solid foods. ° ° °3) Please notify your doctor immediately if you have any unusual bleeding, trouble breathing, redness and pain at the surgery site, drainage, fever, or pain not relieved by medication. ° ° ° °4) Additional Instructions: ° ° ° ° ° ° ° °Please contact your physician with any problems or Same Day Surgery at [phone], Monday through Friday 6 am to 4 pm, or [place] [facility] [place] [facility] number at [phone]. °

## 2019-10-02 NOTE — Op Note (Signed)
Procedure Date:  10/02/2019  Pre-operative Diagnosis:  Recurrent right inguinal hernia and incisional umbilical hernia  Post-operative Diagnosis:  Recurrent right inguinal hernia and incisional umbilical hernia  Procedure: 1.  Robotic assisted recurrent right Inguinal Hernia Repair 2.  Creation of right Posterior Rectus-Transversalis Fascia Advancment Flap for Coverage of Pelvic Wound (200 cm) 3.  Open Incisional umbilical hernia repair  Surgeon:  Melvyn Neth, MD  Assistant:  Willaim Rayas, PA-S  Anesthesia:  General endotracheal  Estimated Blood Loss:  10 ml  Specimens:  None  Complications:  None  Indications for Procedure:  This is a 59 y.o. male who presents with a recurrent right inguinal hernia as well as an incisional hernia at the umbilicus from a previous exploratory laparotomy.  The options of surgery versus observation were reviewed with the patient and/or family. The risks of bleeding, abscess or infection, recurrence of symptoms, potential for an open procedure, injury to surrounding structures, and chronic pain were all discussed with the patient and was he willing to proceed.  We have planned this transabdominal procedure with the creation of right peritoneal flap based on the posterior rectus sheath and transversalis fascia in order to fully cover the mesh, creating a natural tisssue barrier for the bowel and peritoneal cavity.  Description of Procedure: The patient was correctly identified in the preoperative area and brought into the operating room.  The patient was placed supine with VTE prophylaxis in place.  Appropriate time-outs were performed.  Anesthesia was induced and the patient was intubated.  Foley catheter was placed.  Appropriate antibiotics were infused.  The abdomen was prepped and draped in a sterile fashion. A supraumbilical incision was made.  Cautery was used to dissect down along the umbilical stalk.  The stalk was dissected free using  cautery, revealing a 8 mm hernia defect.  The fascial edges were dissected and the defect was enlarged in order to fit a 12 mm robotic trocar.  Pneumoperitoneum was obtained with appropriate opening pressures.  Two 8-mm robotic ports were placed in the right and left lateral positions under direct visualization.  A right large 3D Max Bard Mesh, a 2-0 Vicryl, and 2-0 vloc suture were placed through the umbilical port under direct visualization.  The [employer] platform was docked onto the patient, the camera was inserted and targeted, and the instruments were placed under direct visualization.  Both inguinal regions were inspected for hernias and it was confirmed that the patient had a right direct inguinal hernia.  Using electocautery, the peritoneal and posterior rectus tissue flap was created.  The peritoneum on the right side was scored from the median umbilical ligament laterally towards the ASIS.  The flap was mobilized using robotic scissors and the bipolar instruments, creating a plane along the posterior rectus sheath and transversalis fascia down to the pubic tubercle medially. It was then further mobilized laterally across the inguinal canal and femoral vessels and onto the psoas muscle. The inferior epigastric vessels were identified and preserved. This created a posterior rectus and peritoneal flap measuring roughly 17 cm x 12 cm.  In doing so, a few small peritoneal tears were created.  The direct hernia contents were reduced preserving all structures.  A right large Bard 3D Max mesh was placed with good overlap along all the potential hernia defects and secured in place with 2-0 Vicryl along the medial superomedial and superolateral aspects.  Then, the peritoneal flap was advanced over the mesh and carried over to close the defect. A  running 2-0 V lock suture was used to approximate the edge of the flap onto the peritoneum.  An additional 2-0 v lock suture was used to close the peritoneal tears using  flap from the patient's hernia sac.  All needles were removed under direct visualization.  The 8- mm ports were removed under direct visualization and the 12 mm trocar was removed.  The hernia defect was closed using 0 vicryl sutures.  Local anesthetic was infused in all incisions as well as a right ilioinguinal block.  The umbilical stalk was reattached to the fascia using 2-0 vicryl suture, and the incision was closed using 3-0 vicryl and 4-0 Monocryl.  The 8 mm incisions were closed with 4-0 Monocryl.  The wounds were cleaned and sealed with DermaBond.  Foley catheter was removed and the patient was emerged from anesthesia and extubated and brought to the recovery room for further management.  The patient tolerated the procedure well and all counts were correct at the end of the case.   Melvyn Neth, MD

## 2019-10-02 NOTE — Anesthesia Postprocedure Evaluation (Signed)
Anesthesia Post Note  Patient: Jonathan Simon  Procedure(s) Performed: XI ROBOTIC ASSISTED INGUINAL HERNIA REPAIR WITH MESH (Right ) OPEN HERNIA REPAIR INCISIONAL, umbilical (N/A )  Patient location during evaluation: PACU Anesthesia Type: General Level of consciousness: awake and alert Pain management: pain level controlled Vital Signs Assessment: post-procedure vital signs reviewed and stable Respiratory status: spontaneous breathing, nonlabored ventilation, respiratory function stable and patient connected to nasal cannula oxygen Cardiovascular status: blood pressure returned to baseline and stable Postop Assessment: no apparent nausea or vomiting Anesthetic complications: no   No complications documented.   Last Vitals:  Vitals:   10/02/19 1208 10/02/19 1307  BP: 126/71 123/68  Pulse: 61 72  Resp: 16 16  Temp: 36.6 C   SpO2: 99% 99%    Last Pain:  Vitals:   10/02/19 1307  TempSrc:   PainSc: 6                  Martha Clan

## 2019-10-02 NOTE — Progress Notes (Signed)
°   10/02/19 0805  Clinical Encounter Type  Visited With Family  Visit Type Initial  Referral From Chaplain  Consult/Referral To Chaplain  While rounding chaplain briefly visited with patient's sister. Chaplain explain ed that she wanted to make sure those waiting in waiting area were alright and she said she was. Patient's sister thanked chaplain for checking on her.

## 2019-10-02 NOTE — Interval H&P Note (Signed)
History and Physical Interval Note:  10/02/2019 7:18 AM  Jonathan Simon  has presented today for surgery, with the diagnosis of Recurrent right inguinal hernia, incisional umbilical hernia.  The various methods of treatment have been discussed with the patient and family. After consideration of risks, benefits and other options for treatment, the patient has consented to  Procedure(s): XI ROBOTIC Sarita (Right) OPEN HERNIA REPAIR INCISIONAL, umbilical (N/A) as a surgical intervention.  The patient's history has been reviewed, patient examined, no change in status, stable for surgery.  I have reviewed the patient's chart and labs.  Questions were answered to the patient's satisfaction.     [name]

## 2019-10-03 ENCOUNTER — Encounter: Payer: Self-pay | Admitting: Surgery

## 2019-10-17 ENCOUNTER — Ambulatory Visit (INDEPENDENT_AMBULATORY_CARE_PROVIDER_SITE_OTHER): Payer: Managed Care, Other (non HMO) | Admitting: Physician Assistant

## 2019-10-17 ENCOUNTER — Other Ambulatory Visit: Payer: Self-pay

## 2019-10-17 ENCOUNTER — Encounter: Payer: Self-pay | Admitting: Physician Assistant

## 2019-10-17 VITALS — BP 147/92 | HR 73 | Temp 98.1°F | Resp 12 | Ht 71.0 in | Wt 205.0 lb

## 2019-10-17 DIAGNOSIS — K4091 Unilateral inguinal hernia, without obstruction or gangrene, recurrent: Secondary | ICD-10-CM

## 2019-10-17 DIAGNOSIS — K432 Incisional hernia without obstruction or gangrene: Secondary | ICD-10-CM

## 2019-10-17 DIAGNOSIS — Z09 Encounter for follow-up examination after completed treatment for conditions other than malignant neoplasm: Secondary | ICD-10-CM

## 2019-10-17 MED ORDER — OXYCODONE HCL 5 MG PO TABS: 5.0000 mg | ORAL_TABLET | ORAL | 0 refills | Status: DC | PRN

## 2019-10-17 MED ORDER — IBUPROFEN 600 MG PO TABS: 600.0000 mg | ORAL_TABLET | Freq: Three times a day (TID) | ORAL | 1 refills | Status: DC | PRN

## 2019-10-17 NOTE — Progress Notes (Signed)
[facility] POST-OP OFFICE VISIT  10/17/2019  HPI: Jonathan Simon is a 59 y.o. male 15 days s/p robotic assisted laparoscopic aright inguinal hernia and open umbilical hernia repair with Dr Hampton Abbot.   He reports that he continues to have right inguinal soreness/fullness which has radiated down to his right scrotum. He had run out of the narcotic medications and has been trying tylenol/ibuprofen without significant relief.  Additionally, he reports that he has had relatively constant diarrhea since the surgery and a decreased appetite  No fever, chills, nausea, or emesis.  No problems with the incisions He has been mobilizing and following lifting restrictions.   Vital signs: BP (!) 147/92   Pulse 73   Temp 98.1 F (36.7 C) (Oral)   Resp 12   Ht 5\' 11"  (1.803 m)   Wt 205 lb (93 kg)   SpO2 98%   BMI 28.59 kg/m    Physical Exam: Constitutional: Well appearing male, NAD Abdomen: Soft, non-tender, non-distended, no rebound/guarding Genitourinary: Both testicles are distended, no evidence of tethering, no recurrence, he is sore in the right inguinal region.  Skin: Laparoscopic incisions are well healed  Assessment/Plan: This is a 59 y.o. male 15 days s/p robotic assisted laparoscopic aright inguinal hernia and open umbilical hernia   - I will give him a short Rx for oxycodone to help with sleep. Reviewed restrictions associated with this medication  - Continue Tylenol and high dose ibuprofen  - Will recommend 2 mg imodium TID/QID for diarrhea. He knows this can be obtained OTC  - Reviewed lifting restrictions  - Reviewed wound care  - All questions addressed and answered and agreeable with POC outlined above  - Will RTC in 2 weeks with myself or Dr Kirke Corin for re-check   -- Edison Simon, PA-C [facility] 10/17/2019, 10:05 AM [phone] M-F: 7am - 4pm

## 2019-10-17 NOTE — Patient Instructions (Signed)
Please call our office if you have questions or concerns. You may try 2 mg of Imodium several times a day for the diarrhea.  You may pick up your medication at the pharmacy.    GENERAL POST-OPERATIVE PATIENT INSTRUCTIONS   WOUND CARE INSTRUCTIONS:  Keep a dry clean dressing on the wound if there is drainage. The initial bandage may be removed after 24 hours.  Once the wound has quit draining you may leave it open to air.  If clothing rubs against the wound or causes irritation and the wound is not draining you may cover it with a dry dressing during the daytime.  Try to keep the wound dry and avoid ointments on the wound unless directed to do so.  If the wound becomes bright red and painful or starts to drain infected material that is not clear, please contact your physician immediately.  If the wound is mildly pink and has a thick firm ridge underneath it, this is normal, and is referred to as a healing ridge.  This will resolve over the next 4-6 weeks.  BATHING: You may shower if you have been informed of this by your surgeon. However, Please do not submerge in a tub, hot tub, or pool until incisions are completely sealed or have been told by your surgeon that you may do so.  DIET:  You may eat any foods that you can tolerate.  It is a good idea to eat a high fiber diet and take in plenty of fluids to prevent constipation.  If you do become constipated you may want to take a mild laxative or take ducolax tablets on a daily basis until your bowel habits are regular.  Constipation can be very uncomfortable, along with straining, after recent surgery.  ACTIVITY:  You are encouraged to cough and deep breath or use your incentive spirometer if you were given one, every 15-30 minutes when awake.  This will help prevent respiratory complications and low grade fevers post-operatively if you had a general anesthetic.  You may want to hug a pillow when coughing and sneezing to add additional support to the  surgical area, if you had abdominal or chest surgery, which will decrease pain during these times.  You are encouraged to walk and engage in light activity for the next two weeks.  You should not lift more than 20 pounds, until 11/13/19 as it could put you at increased risk for complications.  Twenty pounds is roughly equivalent to a plastic bag of groceries. At that time- Listen to your body when lifting, if you have pain when lifting, stop and then try again in a few days. Soreness after doing exercises or activities of daily living is normal as you get back in to your normal routine.  MEDICATIONS:  Try to take narcotic medications and anti-inflammatory medications, such as tylenol, ibuprofen, naprosyn, etc., with food.  This will minimize stomach upset from the medication.  Should you develop nausea and vomiting from the pain medication, or develop a rash, please discontinue the medication and contact your physician.  You should not drive, make important decisions, or operate machinery when taking narcotic pain medication.  SUNBLOCK Use sun block to incision area over the next year if this area will be exposed to sun. This helps decrease scarring and will allow you avoid a permanent darkened area over your incision.  QUESTIONS:  Please feel free to call our office if you have any questions, and we will be glad to assist  you. [phone]

## 2019-10-31 ENCOUNTER — Encounter: Payer: Self-pay | Admitting: Surgery

## 2019-10-31 ENCOUNTER — Ambulatory Visit (INDEPENDENT_AMBULATORY_CARE_PROVIDER_SITE_OTHER): Payer: Managed Care, Other (non HMO) | Admitting: Surgery

## 2019-10-31 ENCOUNTER — Other Ambulatory Visit: Payer: Self-pay

## 2019-10-31 VITALS — BP 134/91 | HR 82 | Temp 98.1°F | Ht 71.0 in | Wt 212.0 lb

## 2019-10-31 DIAGNOSIS — K4091 Unilateral inguinal hernia, without obstruction or gangrene, recurrent: Secondary | ICD-10-CM

## 2019-10-31 DIAGNOSIS — U071 COVID-19: Secondary | ICD-10-CM | POA: Insufficient documentation

## 2019-10-31 DIAGNOSIS — K432 Incisional hernia without obstruction or gangrene: Secondary | ICD-10-CM

## 2019-10-31 DIAGNOSIS — Z09 Encounter for follow-up examination after completed treatment for conditions other than malignant neoplasm: Secondary | ICD-10-CM

## 2019-10-31 HISTORY — DX: COVID-19: U07.1

## 2019-10-31 NOTE — Progress Notes (Signed)
10/31/2019  HPI: Jonathan Simon is a 59 y.o. male s/p robotic assisted recurrent right inguinal hernia repair and open incisional/umbilical hernia repair on 10/02/2019.  He presents today for follow-up.  Reports that he has been doing very well.  Denies any residual pain or discomfort and denies any recurrent bulging.  He is eating well having normal bowel function.  Vital signs: BP (!) 134/91   Pulse 82   Temp 98.1 F (36.7 C)   Ht 5\' 11"  (1.803 m)   Wt 212 lb (96.2 kg)   SpO2 97%   BMI 29.57 kg/m    Physical Exam: Constitutional: No acute distress Abdomen: Soft, nondistended, nontender to palpation.  Robotic incisions are healing well and are clean, dry, intact.  Right groin without any evidence of hernia recurrence.  Assessment/Plan: This is a 59 y.o. male s/p robotic right inguinal hernia repair and open umbilical hernia repair.  -Patient feeling very well currently there is no evidence of recurrence. -Patient is okay to return to work next week.  Discussed with him that he still has 2 more weeks of no heavy lifting or pushing of no more than 10 to 15 pounds and afterwards he can liberalize his activities. -Follow-up as needed.   Melvyn Neth, [facility]

## 2019-10-31 NOTE — Patient Instructions (Addendum)
May return to work on September 6th.  Completed 6 weeks of no lifting of more than 20 pounds.   GENERAL POST-OPERATIVE PATIENT INSTRUCTIONS   WOUND CARE INSTRUCTIONS:  Keep a dry clean dressing on the wound if there is drainage. The initial bandage may be removed after 24 hours.  Once the wound has quit draining you may leave it open to air.  If clothing rubs against the wound or causes irritation and the wound is not draining you may cover it with a dry dressing during the daytime.  Try to keep the wound dry and avoid ointments on the wound unless directed to do so.  If the wound becomes bright red and painful or starts to drain infected material that is not clear, please contact your physician immediately.  If the wound is mildly pink and has a thick firm ridge underneath it, this is normal, and is referred to as a healing ridge.  This will resolve over the next 4-6 weeks.  BATHING: You may shower if you have been informed of this by your surgeon. However, Please do not submerge in a tub, hot tub, or pool until incisions are completely sealed or have been told by your surgeon that you may do so.  DIET:  You may eat any foods that you can tolerate.  It is a good idea to eat a high fiber diet and take in plenty of fluids to prevent constipation.  If you do become constipated you may want to take a mild laxative or take ducolax tablets on a daily basis until your bowel habits are regular.  Constipation can be very uncomfortable, along with straining, after recent surgery.  ACTIVITY:  You are encouraged to cough and deep breath or use your incentive spirometer if you were given one, every 15-30 minutes when awake.  This will help prevent respiratory complications and low grade fevers post-operatively if you had a general anesthetic.  You may want to hug a pillow when coughing and sneezing to add additional support to the surgical area, if you had abdominal or chest surgery, which will decrease pain during  these times.  You are encouraged to walk and engage in light activity for the next two weeks.  You should not lift more than 20 pounds for 6 weeks after surgery as it could put you at increased risk for complications.  Twenty pounds is roughly equivalent to a plastic bag of groceries. At that time- Listen to your body when lifting, if you have pain when lifting, stop and then try again in a few days. Soreness after doing exercises or activities of daily living is normal as you get back in to your normal routine.  MEDICATIONS:  Try to take narcotic medications and anti-inflammatory medications, such as tylenol, ibuprofen, naprosyn, etc., with food.  This will minimize stomach upset from the medication.  Should you develop nausea and vomiting from the pain medication, or develop a rash, please discontinue the medication and contact your physician.  You should not drive, make important decisions, or operate machinery when taking narcotic pain medication.  SUNBLOCK Use sun block to incision area over the next year if this area will be exposed to sun. This helps decrease scarring and will allow you avoid a permanent darkened area over your incision.  QUESTIONS:  Please feel free to call our office if you have any questions, and we will be glad to assist you.

## 2019-11-08 ENCOUNTER — Encounter: Payer: Self-pay | Admitting: Nurse Practitioner

## 2019-11-08 ENCOUNTER — Other Ambulatory Visit: Payer: Self-pay | Admitting: Nurse Practitioner

## 2019-11-08 DIAGNOSIS — U071 COVID-19: Secondary | ICD-10-CM

## 2019-11-08 DIAGNOSIS — I10 Essential (primary) hypertension: Secondary | ICD-10-CM | POA: Insufficient documentation

## 2019-11-08 NOTE — Progress Notes (Signed)
I connected by phone with Jonathan Simon on 11/08/2019 at 1:13 PM to discuss the potential use of a new treatment for mild to moderate COVID-19 viral infection in non-hospitalized patients.  This patient is a 59 y.o. male that meets the FDA criteria for Emergency Use Authorization of COVID monoclonal antibody casirivimab/imdevimab.  Has a (+) direct SARS-CoV-2 viral test result  Has mild or moderate COVID-19   Is NOT hospitalized due to COVID-19  Is within 10 days of symptom onset  Has at least one of the high risk factor(s) for progression to severe COVID-19 and/or hospitalization as defined in EUA.  Specific high risk criteria : Cardiovascular disease or hypertension; BMI   I have spoken and communicated the following to the patient or parent/caregiver regarding COVID monoclonal antibody treatment:  1. FDA has authorized the emergency use for the treatment of mild to moderate COVID-19 in adults and pediatric patients with positive results of direct SARS-CoV-2 viral testing who are 52 years of age and older weighing at least 40 kg, and who are at high risk for progressing to severe COVID-19 and/or hospitalization.  2. The significant known and potential risks and benefits of COVID monoclonal antibody, and the extent to which such potential risks and benefits are unknown.  3. Information on available alternative treatments and the risks and benefits of those alternatives, including clinical trials.  4. Patients treated with COVID monoclonal antibody should continue to self-isolate and use infection control measures (e.g., wear mask, isolate, social distance, avoid sharing personal items, clean and disinfect "high touch" surfaces, and frequent handwashing) according to CDC guidelines.   5. The patient or parent/caregiver has the option to accept or refuse COVID monoclonal antibody treatment.  After reviewing this information with the patient, The patient agreed to proceed with receiving  casirivimab\imdevimab infusion and will be provided a copy of the Fact sheet prior to receiving the infusion.  Murray Hodgkins, NP 11/08/2019 1:13 PM

## 2019-11-09 ENCOUNTER — Ambulatory Visit (HOSPITAL_COMMUNITY)
Admission: RE | Admit: 2019-11-09 | Discharge: 2019-11-09 | Disposition: A | Discharge status: Home / Self Care | Payer: Managed Care, Other (non HMO) | Source: Ambulatory Visit | Attending: Pulmonary Disease | Admitting: Pulmonary Disease

## 2019-11-09 DIAGNOSIS — I10 Essential (primary) hypertension: Secondary | ICD-10-CM | POA: Diagnosis present

## 2019-11-09 DIAGNOSIS — U071 COVID-19: Secondary | ICD-10-CM | POA: Diagnosis present

## 2019-11-09 MED ORDER — EPINEPHRINE 0.3 MG/0.3ML IJ SOAJ: 0.3000 mg | Freq: Once | INTRAMUSCULAR | Status: DC | PRN

## 2019-11-09 MED ORDER — FAMOTIDINE IN NACL 20-0.9 MG/50ML-% IV SOLN: 20.0000 mg | Freq: Once | INTRAVENOUS | Status: DC | PRN

## 2019-11-09 MED ORDER — SODIUM CHLORIDE 0.9 % IV SOLN
1200.0000 mg | Freq: Once | INTRAVENOUS | Status: AC
  Administered 2019-11-09: 1200 mg via INTRAVENOUS
  Filled 2019-11-09: qty 10

## 2019-11-09 MED ORDER — SODIUM CHLORIDE 0.9 % IV SOLN: INTRAVENOUS | Status: DC | PRN

## 2019-11-09 MED ORDER — DIPHENHYDRAMINE HCL 50 MG/ML IJ SOLN: 50.0000 mg | Freq: Once | INTRAMUSCULAR | Status: DC | PRN

## 2019-11-09 MED ORDER — METHYLPREDNISOLONE SODIUM SUCC 125 MG IJ SOLR: 125.0000 mg | Freq: Once | INTRAMUSCULAR | Status: DC | PRN

## 2019-11-09 MED ORDER — ALBUTEROL SULFATE HFA 108 (90 BASE) MCG/ACT IN AERS: 2.0000 | INHALATION_SPRAY | Freq: Once | RESPIRATORY_TRACT | Status: DC | PRN

## 2019-11-09 NOTE — Discharge Instructions (Signed)

## 2019-11-09 NOTE — Progress Notes (Signed)
  Diagnosis: COVID-19  Physician: Patrick Wright, MD  Procedure: [facility] Med: casirivimab\imdevimab infusion - Provided patient with casirivimab\imdevimab fact sheet for patients, parents and caregivers prior to infusion.  Complications: No immediate complications noted.  Discharge: Discharged home   Teckla Christiansen N Zadie Deemer 11/09/2019  

## 2019-12-07 ENCOUNTER — Encounter: Payer: 59 | Admitting: Internal Medicine

## 2020-01-02 ENCOUNTER — Other Ambulatory Visit: Payer: Self-pay

## 2020-01-02 ENCOUNTER — Emergency Department: Payer: 59

## 2020-01-02 ENCOUNTER — Inpatient Hospital Stay
Admission: EM | Admit: 2020-01-02 | Discharge: 2020-01-04 | DRG: 871 | Disposition: A | Discharge status: Home / Self Care | Payer: 59 | Attending: Family Medicine | Admitting: Family Medicine

## 2020-01-02 DIAGNOSIS — E785 Hyperlipidemia, unspecified: Secondary | ICD-10-CM | POA: Diagnosis present

## 2020-01-02 DIAGNOSIS — F1011 Alcohol abuse, in remission: Secondary | ICD-10-CM | POA: Diagnosis present

## 2020-01-02 DIAGNOSIS — E872 Acidosis, unspecified: Secondary | ICD-10-CM | POA: Diagnosis present

## 2020-01-02 DIAGNOSIS — A419 Sepsis, unspecified organism: Secondary | ICD-10-CM | POA: Diagnosis present

## 2020-01-02 DIAGNOSIS — R197 Diarrhea, unspecified: Secondary | ICD-10-CM | POA: Diagnosis present

## 2020-01-02 DIAGNOSIS — E860 Dehydration: Secondary | ICD-10-CM | POA: Diagnosis present

## 2020-01-02 DIAGNOSIS — Z79899 Other long term (current) drug therapy: Secondary | ICD-10-CM

## 2020-01-02 DIAGNOSIS — R6521 Severe sepsis with septic shock: Secondary | ICD-10-CM | POA: Diagnosis present

## 2020-01-02 DIAGNOSIS — J189 Pneumonia, unspecified organism: Secondary | ICD-10-CM | POA: Diagnosis present

## 2020-01-02 DIAGNOSIS — R7989 Other specified abnormal findings of blood chemistry: Secondary | ICD-10-CM | POA: Diagnosis present

## 2020-01-02 DIAGNOSIS — E861 Hypovolemia: Secondary | ICD-10-CM | POA: Diagnosis present

## 2020-01-02 DIAGNOSIS — Z8616 Personal history of COVID-19: Secondary | ICD-10-CM | POA: Diagnosis not present

## 2020-01-02 DIAGNOSIS — Z20822 Contact with and (suspected) exposure to covid-19: Secondary | ICD-10-CM | POA: Diagnosis present

## 2020-01-02 DIAGNOSIS — K219 Gastro-esophageal reflux disease without esophagitis: Secondary | ICD-10-CM | POA: Diagnosis present

## 2020-01-02 DIAGNOSIS — I10 Essential (primary) hypertension: Secondary | ICD-10-CM | POA: Diagnosis present

## 2020-01-02 DIAGNOSIS — Z825 Family history of asthma and other chronic lower respiratory diseases: Secondary | ICD-10-CM | POA: Diagnosis not present

## 2020-01-02 DIAGNOSIS — Z8249 Family history of ischemic heart disease and other diseases of the circulatory system: Secondary | ICD-10-CM

## 2020-01-02 DIAGNOSIS — F1010 Alcohol abuse, uncomplicated: Secondary | ICD-10-CM | POA: Diagnosis present

## 2020-01-02 LAB — BLOOD GAS, VENOUS
Acid-base deficit: 11.9 mmol/L — ABNORMAL HIGH (ref 0.0–2.0)
Bicarbonate: 12.8 mmol/L — ABNORMAL LOW (ref 20.0–28.0)
O2 Saturation: 86.4 %
Patient temperature: 37
pCO2, Ven: 26 mmHg — ABNORMAL LOW (ref 44.0–60.0)
pH, Ven: 7.3 (ref 7.250–7.430)
pO2, Ven: 58 mmHg — ABNORMAL HIGH (ref 32.0–45.0)

## 2020-01-02 LAB — URINALYSIS, COMPLETE (UACMP) WITH MICROSCOPIC
Bacteria, UA: NONE SEEN
Bilirubin Urine: NEGATIVE
Glucose, UA: NEGATIVE mg/dL
Hgb urine dipstick: NEGATIVE
Ketones, ur: 20 mg/dL — AB
Leukocytes,Ua: NEGATIVE
Nitrite: NEGATIVE
Protein, ur: NEGATIVE mg/dL
Specific Gravity, Urine: 1.013 (ref 1.005–1.030)
Squamous Epithelial / HPF: NONE SEEN (ref 0–5)
pH: 5 (ref 5.0–8.0)

## 2020-01-02 LAB — RESP PANEL BY RT PCR (RSV, FLU A&B, COVID)
Influenza A by PCR: NEGATIVE
Influenza B by PCR: NEGATIVE
Respiratory Syncytial Virus by PCR: NEGATIVE
SARS Coronavirus 2 by RT PCR: NEGATIVE

## 2020-01-02 LAB — COMPREHENSIVE METABOLIC PANEL
ALT: 73 U/L — ABNORMAL HIGH (ref 0–44)
AST: 139 U/L — ABNORMAL HIGH (ref 15–41)
Albumin: 4.4 g/dL (ref 3.5–5.0)
Alkaline Phosphatase: 83 U/L (ref 38–126)
Anion gap: 24 — ABNORMAL HIGH (ref 5–15)
BUN: 13 mg/dL (ref 8–23)
CO2: 13 mmol/L — ABNORMAL LOW (ref 22–32)
Calcium: 8.3 mg/dL — ABNORMAL LOW (ref 8.9–10.3)
Chloride: 100 mmol/L (ref 98–111)
Creatinine, Ser: 1.06 mg/dL (ref 0.61–1.24)
GFR, Estimated: >60 mL/min (ref >60)
Glucose, Bld: 85 mg/dL (ref 70–99)
Potassium: 3.7 mmol/L (ref 3.5–5.1)
Sodium: 137 mmol/L (ref 135–145)
Total Bilirubin: 1.7 mg/dL — ABNORMAL HIGH (ref 0.3–1.2)
Total Protein: 7.3 g/dL (ref 6.5–8.1)

## 2020-01-02 LAB — CBC WITH DIFFERENTIAL/PLATELET
Abs Immature Granulocytes: 0.05 10*3/uL (ref 0.00–0.07)
Basophils Absolute: 0.1 10*3/uL (ref 0.0–0.1)
Basophils Relative: 1 %
Eosinophils Absolute: 0 10*3/uL (ref 0.0–0.5)
Eosinophils Relative: 0 %
HCT: 44.5 % (ref 39.0–52.0)
Hemoglobin: 16.1 g/dL (ref 13.0–17.0)
Immature Granulocytes: 0 %
Lymphocytes Relative: 14 %
Lymphs Abs: 1.6 10*3/uL (ref 0.7–4.0)
MCH: 33.5 pg (ref 26.0–34.0)
MCHC: 36.2 g/dL — ABNORMAL HIGH (ref 30.0–36.0)
MCV: 92.7 fL (ref 80.0–100.0)
Monocytes Absolute: 0.5 10*3/uL (ref 0.1–1.0)
Monocytes Relative: 4 %
Neutro Abs: 9.5 10*3/uL — ABNORMAL HIGH (ref 1.7–7.7)
Neutrophils Relative %: 81 %
Platelets: 344 10*3/uL (ref 150–400)
RBC: 4.8 MIL/uL (ref 4.22–5.81)
RDW: 13.3 % (ref 11.5–15.5)
WBC: 11.8 10*3/uL — ABNORMAL HIGH (ref 4.0–10.5)
nRBC: 0 % (ref 0.0–0.2)

## 2020-01-02 LAB — CBC
HCT: 38.4 % — ABNORMAL LOW (ref 39.0–52.0)
Hemoglobin: 14 g/dL (ref 13.0–17.0)
MCH: 33.4 pg (ref 26.0–34.0)
MCHC: 36.5 g/dL — ABNORMAL HIGH (ref 30.0–36.0)
MCV: 91.6 fL (ref 80.0–100.0)
Platelets: 237 10*3/uL (ref 150–400)
RBC: 4.19 MIL/uL — ABNORMAL LOW (ref 4.22–5.81)
RDW: 13.6 % (ref 11.5–15.5)
WBC: 9.3 10*3/uL (ref 4.0–10.5)
nRBC: 0 % (ref 0.0–0.2)

## 2020-01-02 LAB — CREATININE, SERUM
Creatinine, Ser: 0.83 mg/dL (ref 0.61–1.24)
GFR, Estimated: >60 mL/min (ref >60)

## 2020-01-02 LAB — PROCALCITONIN: Procalcitonin: <0.1 ng/mL

## 2020-01-02 LAB — TROPONIN I (HIGH SENSITIVITY)
Troponin I (High Sensitivity): 5 ng/L (ref <18)
Troponin I (High Sensitivity): 5 ng/L (ref <18)

## 2020-01-02 LAB — APTT: aPTT: 26 seconds (ref 24–36)

## 2020-01-02 LAB — PROTIME-INR
INR: 1.1 (ref 0.8–1.2)
Prothrombin Time: 14 seconds (ref 11.4–15.2)

## 2020-01-02 LAB — BRAIN NATRIURETIC PEPTIDE: B Natriuretic Peptide: 36 pg/mL (ref 0.0–100.0)

## 2020-01-02 LAB — LACTIC ACID, PLASMA
Lactic Acid, Venous: >11 mmol/L (ref 0.5–1.9)
Lactic Acid, Venous: 11 mmol/L (ref 0.5–1.9)
Lactic Acid, Venous: 7.8 mmol/L (ref 0.5–1.9)

## 2020-01-02 LAB — COOXEMETRY PANEL

## 2020-01-02 LAB — MAGNESIUM: Magnesium: 2.2 mg/dL (ref 1.7–2.4)

## 2020-01-02 IMAGING — CR DG CHEST 2V
1 series · 2 of 2 positions shown · non-contrast
Comparison: None.

CLINICAL DATA: Chills fever

EXAM:
CHEST - 2 VIEW

[Series 1: dg chest 2 view · 0.14mm/px · 2 of 2 slices shown]
[im 1/2]
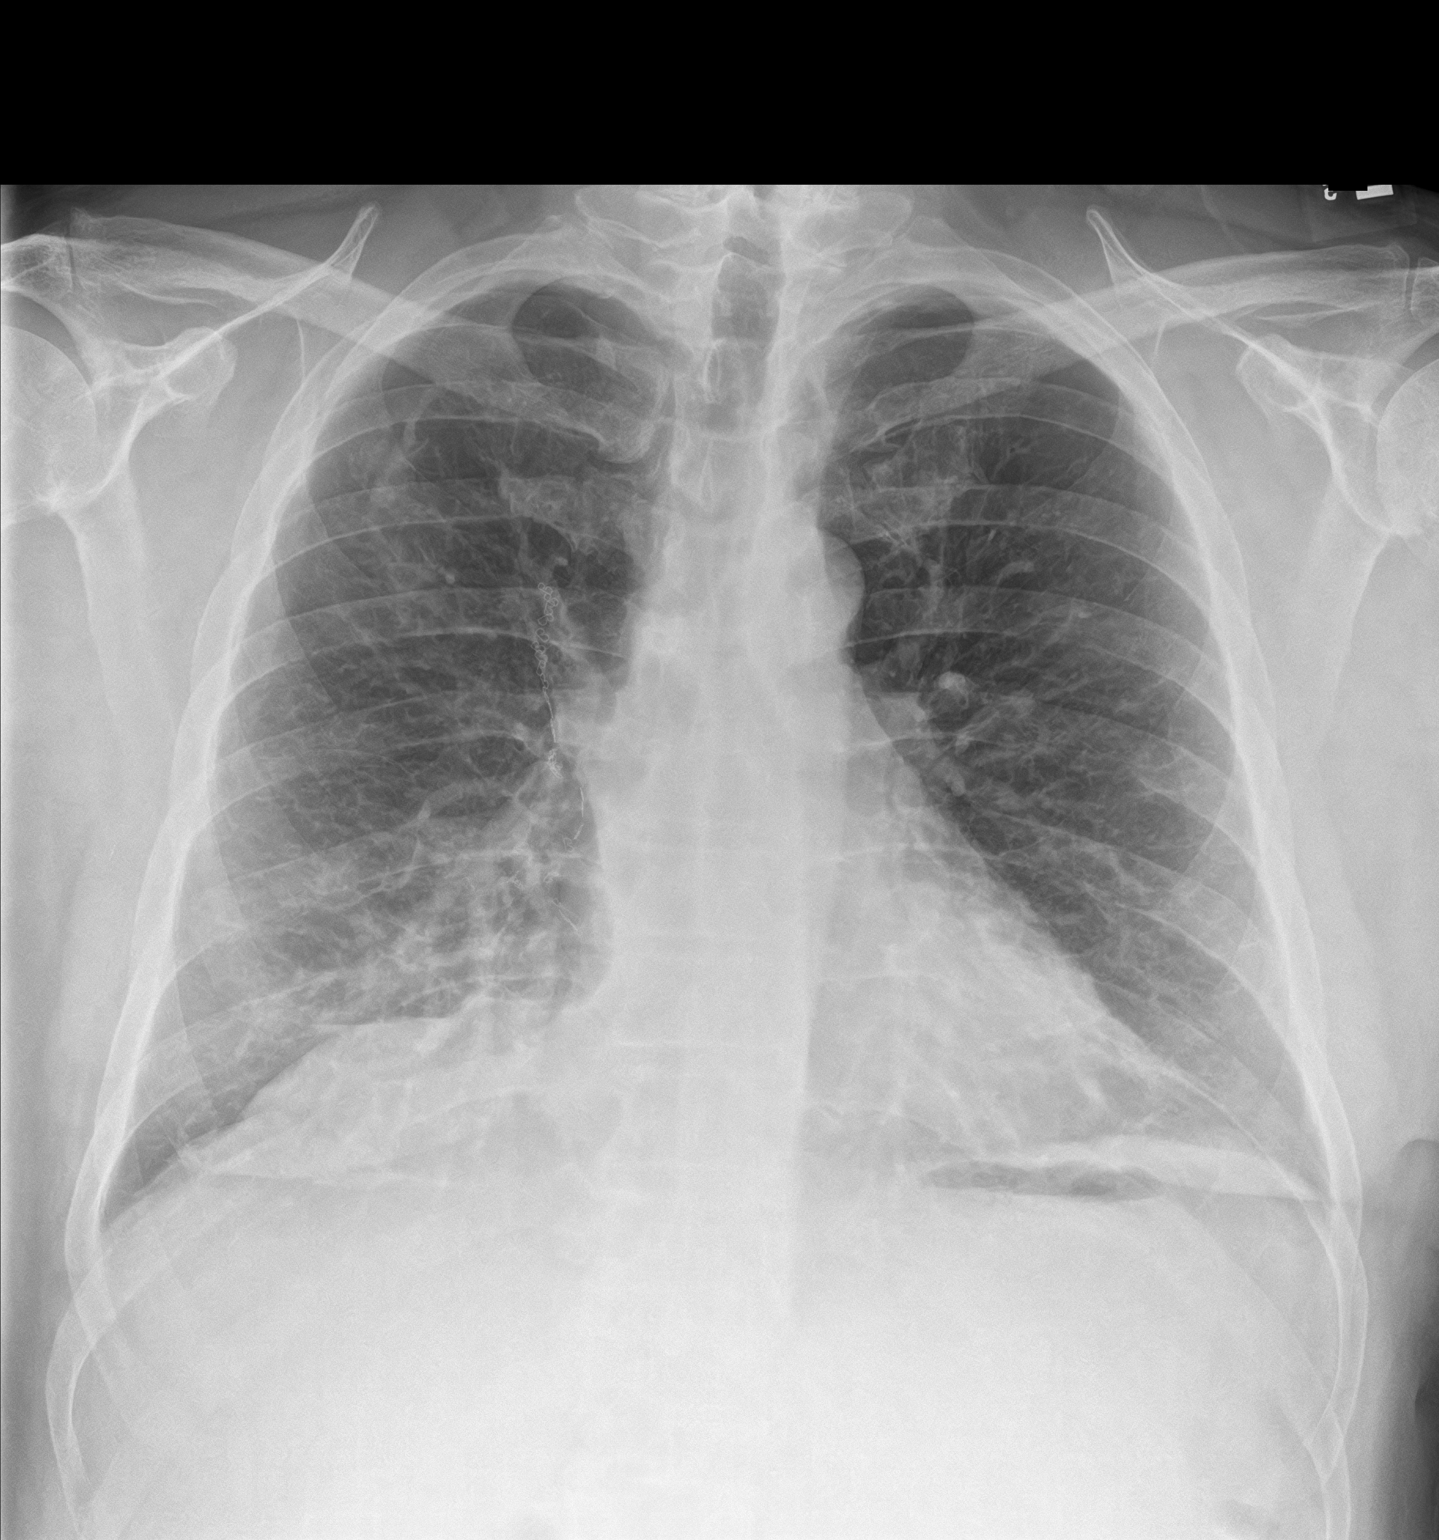
[im 2/2]
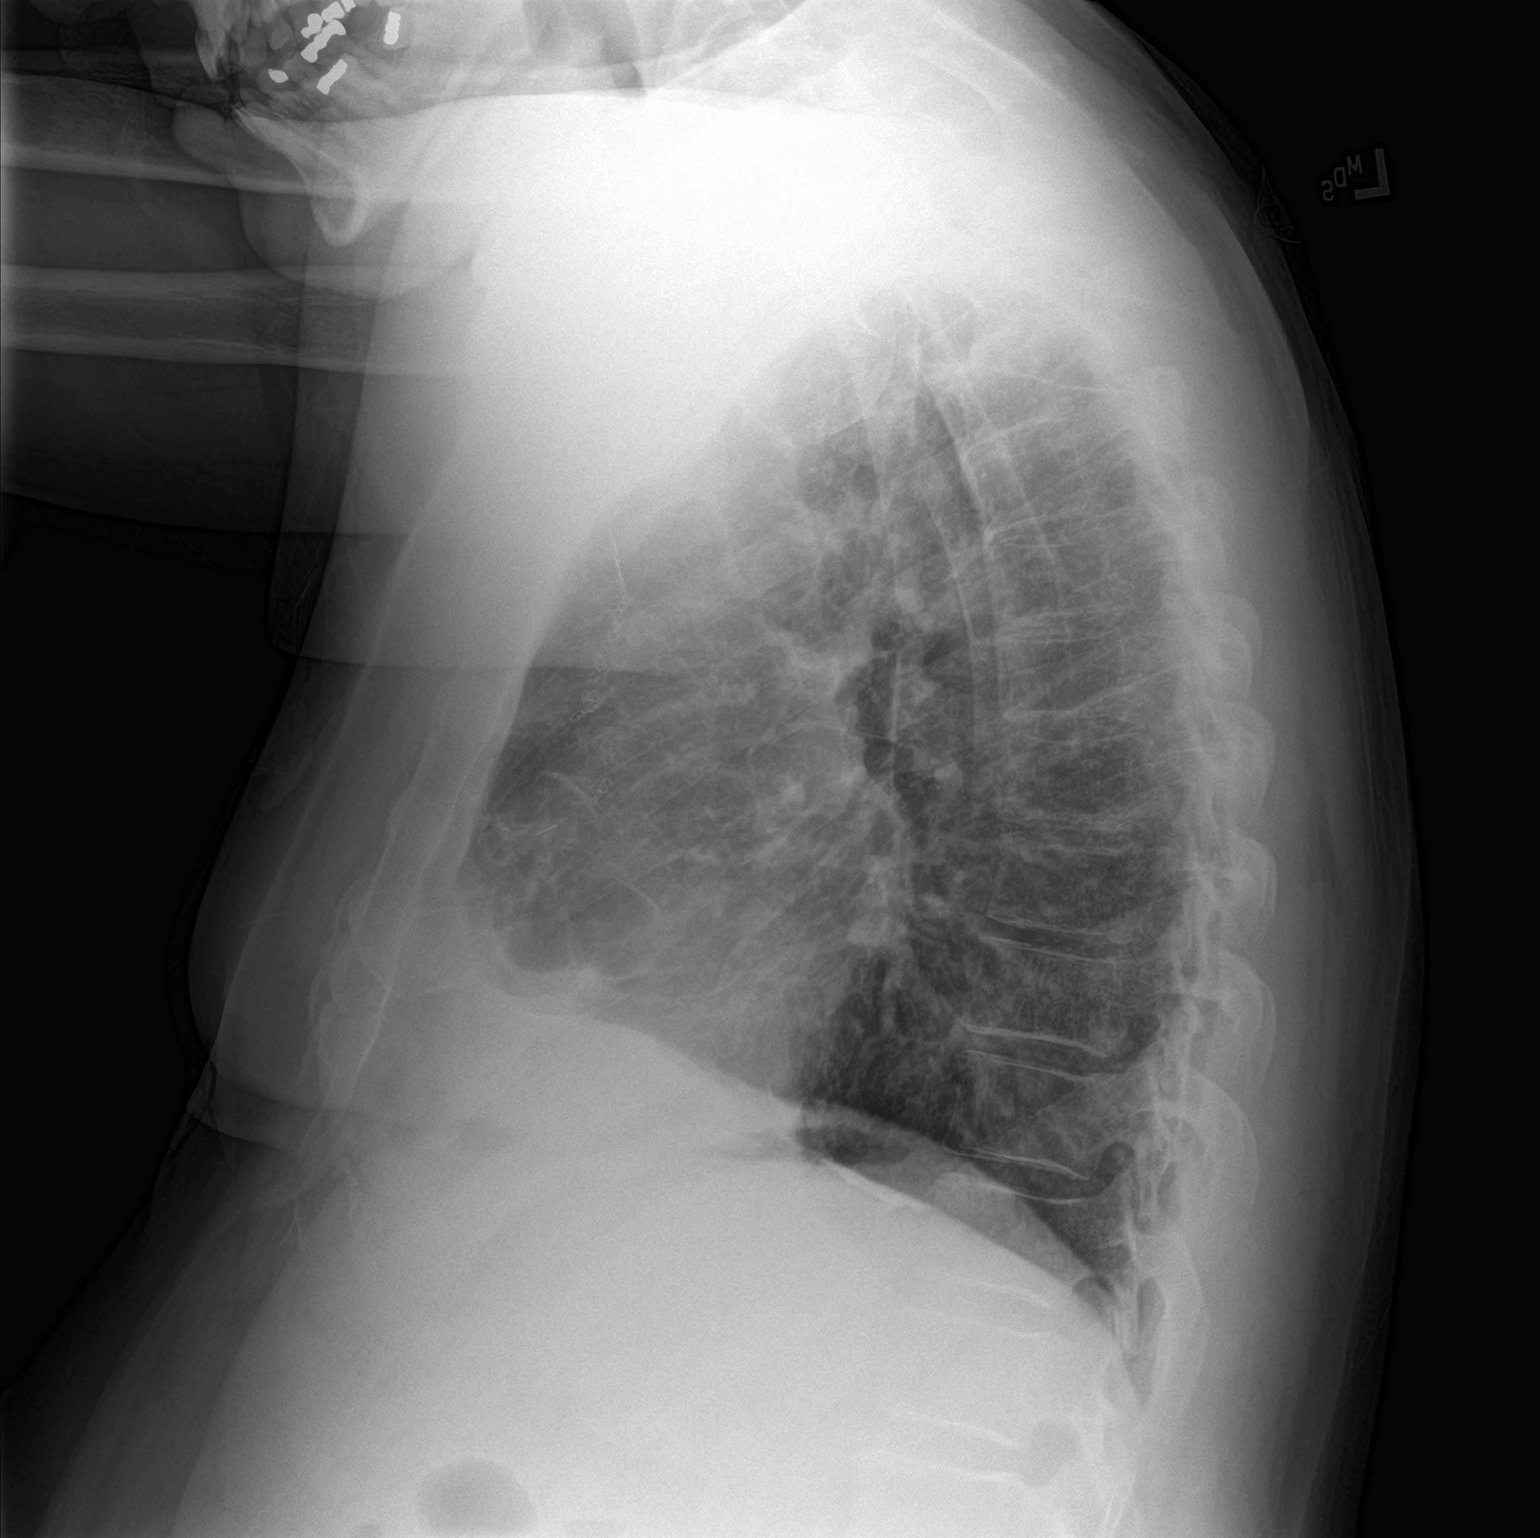

[2 of 2 positions shown; findings below may reference images not displayed]

FINDINGS: The cardiomediastinal silhouette is normal in contour.Surgical
sutures project over the RIGHT lung. No pleural effusion. No
pneumothorax. Mild peripheral interstitial prominence. Visualized
abdomen is unremarkable. Mild degenerative changes of the thoracic
spine.
IMPRESSION: Mild peripheral interstitial prominence, which may reflect mild
edema versus atypical infection.

## 2020-01-02 MED ORDER — LACTATED RINGERS IV BOLUS
1000.0000 mL | Freq: Once | INTRAVENOUS | Status: AC
  Administered 2020-01-02: 1000 mL via INTRAVENOUS

## 2020-01-02 MED ORDER — LACTATED RINGERS IV BOLUS (SEPSIS): 1000.0000 mL | Freq: Once | INTRAVENOUS | Status: DC

## 2020-01-02 MED ORDER — ENOXAPARIN SODIUM 40 MG/0.4ML ~~LOC~~ SOLN
40.0000 mg | SUBCUTANEOUS | Status: DC
  Administered 2020-01-02 – 2020-01-03 (×2): 40 mg via SUBCUTANEOUS
  Filled 2020-01-02 (×2): qty 0.4

## 2020-01-02 MED ORDER — SODIUM CHLORIDE 0.9 % IV SOLN
2.0000 g | INTRAVENOUS | Status: DC
  Administered 2020-01-02: 2 g via INTRAVENOUS
  Filled 2020-01-02 (×2): qty 20

## 2020-01-02 MED ORDER — SODIUM CHLORIDE 0.9 % IV SOLN
500.0000 mg | INTRAVENOUS | Status: DC
  Administered 2020-01-02 – 2020-01-03 (×2): 500 mg via INTRAVENOUS
  Filled 2020-01-02 (×3): qty 500

## 2020-01-02 MED ORDER — FOLIC ACID 1 MG PO TABS
1.0000 mg | ORAL_TABLET | Freq: Every day | ORAL | Status: DC
  Administered 2020-01-02 – 2020-01-04 (×3): 1 mg via ORAL
  Filled 2020-01-02 (×3): qty 1

## 2020-01-02 MED ORDER — ONDANSETRON HCL 4 MG PO TABS
4.0000 mg | ORAL_TABLET | Freq: Four times a day (QID) | ORAL | Status: DC | PRN
  Administered 2020-01-03 (×2): 4 mg via ORAL
  Filled 2020-01-02 (×2): qty 1

## 2020-01-02 MED ORDER — SODIUM CHLORIDE 0.9 % IV BOLUS
1000.0000 mL | Freq: Once | INTRAVENOUS | Status: AC
  Administered 2020-01-02: 1000 mL via INTRAVENOUS

## 2020-01-02 MED ORDER — ONDANSETRON HCL 4 MG/2ML IJ SOLN: 4.0000 mg | Freq: Four times a day (QID) | INTRAMUSCULAR | Status: DC | PRN

## 2020-01-02 MED ORDER — THIAMINE HCL 100 MG PO TABS
100.0000 mg | ORAL_TABLET | Freq: Every day | ORAL | Status: DC
  Administered 2020-01-02 – 2020-01-04 (×3): 100 mg via ORAL
  Filled 2020-01-02 (×3): qty 1

## 2020-01-02 MED ORDER — ACETAMINOPHEN 650 MG RE SUPP: 650.0000 mg | Freq: Four times a day (QID) | RECTAL | Status: DC | PRN

## 2020-01-02 MED ORDER — LACTATED RINGERS IV SOLN: INTRAVENOUS | Status: DC

## 2020-01-02 MED ORDER — ACETAMINOPHEN 325 MG PO TABS: 650.0000 mg | ORAL_TABLET | Freq: Four times a day (QID) | ORAL | Status: DC | PRN

## 2020-01-02 NOTE — Progress Notes (Signed)
PHARMACY -  BRIEF ANTIBIOTIC NOTE   Pharmacy has received consult(s) for  from an ED provider.  The patient's profile has been reviewed for ht/wt/allergies/indication/available labs.    One time order(s) placed for ceftriaxone and azithromycin,  Further antibiotics/pharmacy consults should be ordered by admitting physician if indicated.                       Thank you, Renda Rolls, PharmD, [facility] 01/02/2020 5:09 PM

## 2020-01-02 NOTE — ED Triage Notes (Signed)
Pt comes POV with chills, fever, weakness, body aches for 3 days. Pt had covid in September. Pt states that he feels like he is going to pass out.

## 2020-01-02 NOTE — Progress Notes (Signed)
CODE SEPSIS - PHARMACY COMMUNICATION  **Broad Spectrum Antibiotics should be administered within 1 hour of Sepsis diagnosis**  Time Code Sepsis Called/Page Received: 1706  Antibiotics Ordered: Ceftriaxone and Azithromycin  Time of 1st antibiotic administration: Ceftriaxone started at [place], PharmD, [facility] 01/02/2020 5:10 PM
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
## 2020-01-02 NOTE — ED Notes (Signed)
Date and time results received: 01/02/20 4:50 PM   Test: Lactic Acid  Critical Value: >11  Name of Provider Notified: Tamala Julian, MD  Orders Received? Or Actions Taken?: Provider aware.

## 2020-01-02 NOTE — Sepsis Progress Note (Signed)
Continued decrease in lactic acid. Vital signs stable. S/o Code Sepsis.

## 2020-01-02 NOTE — Sepsis Progress Note (Signed)
S/o of Code Sepsis from [place]. Antibiotics and volume resuscitation given. Lactic acid remains elevated but with improvement. Will continue to track for the remainder of the the 6 hours.
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
## 2020-01-02 NOTE — ED Notes (Signed)
Report given to Mac, RN at this time.

## 2020-01-03 LAB — CBC
HCT: 37.4 % — ABNORMAL LOW (ref 39.0–52.0)
Hemoglobin: 13.5 g/dL (ref 13.0–17.0)
MCH: 33.3 pg (ref 26.0–34.0)
MCHC: 36.1 g/dL — ABNORMAL HIGH (ref 30.0–36.0)
MCV: 92.1 fL (ref 80.0–100.0)
Platelets: 223 10*3/uL (ref 150–400)
RBC: 4.06 MIL/uL — ABNORMAL LOW (ref 4.22–5.81)
RDW: 13.7 % (ref 11.5–15.5)
WBC: 6.9 10*3/uL (ref 4.0–10.5)
nRBC: 0 % (ref 0.0–0.2)

## 2020-01-03 LAB — C DIFFICILE QUICK SCREEN W PCR REFLEX
C Diff antigen: NEGATIVE
C Diff interpretation: NOT DETECTED
C Diff toxin: NEGATIVE

## 2020-01-03 LAB — COMPREHENSIVE METABOLIC PANEL
ALT: 62 U/L — ABNORMAL HIGH (ref 0–44)
AST: 113 U/L — ABNORMAL HIGH (ref 15–41)
Albumin: 3.6 g/dL (ref 3.5–5.0)
Alkaline Phosphatase: 66 U/L (ref 38–126)
Anion gap: 10 (ref 5–15)
BUN: 10 mg/dL (ref 8–23)
CO2: 22 mmol/L (ref 22–32)
Calcium: 8 mg/dL — ABNORMAL LOW (ref 8.9–10.3)
Chloride: 101 mmol/L (ref 98–111)
Creatinine, Ser: 0.8 mg/dL (ref 0.61–1.24)
GFR, Estimated: >60 mL/min (ref >60)
Glucose, Bld: 107 mg/dL — ABNORMAL HIGH (ref 70–99)
Potassium: 3.8 mmol/L (ref 3.5–5.1)
Sodium: 133 mmol/L — ABNORMAL LOW (ref 135–145)
Total Bilirubin: 2.3 mg/dL — ABNORMAL HIGH (ref 0.3–1.2)
Total Protein: 6.3 g/dL — ABNORMAL LOW (ref 6.5–8.1)

## 2020-01-03 LAB — URINE CULTURE: Culture: NO GROWTH

## 2020-01-03 LAB — ACETAMINOPHEN LEVEL: Acetaminophen (Tylenol), Serum: <10 ug/mL — ABNORMAL LOW (ref 10–30)

## 2020-01-03 LAB — HIV ANTIBODY (ROUTINE TESTING W REFLEX): HIV Screen 4th Generation wRfx: NONREACTIVE

## 2020-01-03 LAB — LACTIC ACID, PLASMA: Lactic Acid, Venous: 2.3 mmol/L (ref 0.5–1.9)

## 2020-01-03 MED ORDER — AMLODIPINE BESYLATE 10 MG PO TABS
10.0000 mg | ORAL_TABLET | Freq: Every day | ORAL | Status: DC
  Administered 2020-01-03 – 2020-01-04 (×2): 10 mg via ORAL
  Filled 2020-01-03 (×2): qty 1

## 2020-01-03 MED ORDER — PANTOPRAZOLE SODIUM 40 MG PO TBEC
40.0000 mg | DELAYED_RELEASE_TABLET | Freq: Every day | ORAL | Status: DC
  Administered 2020-01-03 – 2020-01-04 (×2): 40 mg via ORAL
  Filled 2020-01-03 (×2): qty 1

## 2020-01-03 MED ORDER — DIPHENHYDRAMINE HCL 25 MG PO CAPS
25.0000 mg | ORAL_CAPSULE | Freq: Every evening | ORAL | Status: DC | PRN
  Administered 2020-01-03: 25 mg via ORAL
  Filled 2020-01-03: qty 1

## 2020-01-03 MED ORDER — SIMVASTATIN 20 MG PO TABS
10.0000 mg | ORAL_TABLET | Freq: Every day | ORAL | Status: DC
  Administered 2020-01-03: 10 mg via ORAL
  Filled 2020-01-03: qty 1

## 2020-01-03 MED ORDER — LOPERAMIDE HCL 2 MG PO CAPS: 2.0000 mg | ORAL_CAPSULE | Freq: Once | ORAL | Status: DC | PRN

## 2020-01-03 MED ORDER — LOPERAMIDE HCL 2 MG PO CAPS
2.0000 mg | ORAL_CAPSULE | Freq: Two times a day (BID) | ORAL | Status: DC | PRN
  Administered 2020-01-03 (×2): 2 mg via ORAL
  Filled 2020-01-03 (×4): qty 1

## 2020-01-03 MED ORDER — MELATONIN 5 MG PO TABS
5.0000 mg | ORAL_TABLET | Freq: Every day | ORAL | Status: DC
  Administered 2020-01-03: 5 mg via ORAL
  Filled 2020-01-03: qty 1

## 2020-01-03 MED ORDER — METOPROLOL SUCCINATE ER 25 MG PO TB24
25.0000 mg | ORAL_TABLET | Freq: Every day | ORAL | Status: DC
  Administered 2020-01-04: 25 mg via ORAL
  Filled 2020-01-03: qty 1

## 2020-01-03 MED ORDER — FLUTICASONE PROPIONATE 50 MCG/ACT NA SUSP
2.0000 | Freq: Every day | NASAL | Status: DC | PRN
  Filled 2020-01-03: qty 16

## 2020-01-03 MED ORDER — METOPROLOL SUCCINATE ER 25 MG PO TB24: 25.0000 mg | ORAL_TABLET | Freq: Every day | ORAL | Status: DC

## 2020-01-03 NOTE — Progress Notes (Signed)
Patient blood pressure 150/100 on R arm and 162/102 on the L arm. NP Randol Kern notified. According to patient he takes blood pressure medications at home. Looking at his home medications and per him he takes Norvasc, Hyzaar, and Metoprolol everyday. He hasn't been given any BP medications while here nor has he been given his cholesterol medication.   Randol Kern ordered Norvasc and Simvastatin to be given tonight and then Metoprolol will be started in the morning.

## 2020-01-03 NOTE — Progress Notes (Signed)
Patient requesting sleep aide . NP notified, new order melatonin 5 mg.

## 2020-01-03 NOTE — Plan of Care (Signed)
  Problem: Clinical Measurements: Goal: Diagnostic test results will improve Outcome: Progressing  Labs improving

## 2020-01-03 NOTE — Progress Notes (Addendum)
PROGRESS NOTE    DENSEL Simon  EVO:[id number] DOB: 01-22-1961 DOA: 01/02/2020 PCP: Crecencio Mc, MD    Assessment & Plan:   Principal Problem:   Lactic acid acidosis Active Problems:   Hyperlipidemia with target LDL less than 100   Alcohol abuse   Essential hypertension, benign    Jonathan Simon is a 59 y.o. male with medical history significant of recent COVID-19 infection about 6 weeks ago, history of maxillary sinus, GERD, essential hypertension, history of spontaneous pneumothorax, hyperlipidemia presenting with 3 days of bilateral chest pain weakness and lightheadedness.  Denied any nausea vomiting, but having profuse diarrhea.  He has decreased appetite.  He felt similarly when he had his COVID-19 infection in September.  COVID-19 screen neg on presentation.  Denied any other complaints.  He was found to have markedly elevated lactic acid and some leukocytosis.    #1 lactic acidosis:  --lactic acid >11 on presentation.  Unclear etiology.  No obvious source of infection.  Maybe due to dehydration, though this amount of elevation is unusual.  Lactic acid has been trending down with aggressive IVF. PLAN: --cont MIVF@125  --repeat lactic acid   #2  Leukocytosis:  --Probably again due to dehydration and hemoconcentration. --Empirically started on Rocephin and Zithromax for possible atypical pneumonia. --procal neg PLAN: --d/c ceftriaxone --cont azithromycin for now  #3 hyperlipidemia:  --cont home statin  #4 essential hypertension:  --home BP not ordered on admission. --resume home amlodipine, Toprol and Hyzaar  #5 history of alcohol abuse:  Patient denied daily alcohol intake.  Mainly weekends.  Will monitor if any signs of withdrawal will treat.  #6 possible pneumonia:  --CXR showed "Mild peripheral interstitial prominence, which may reflect mild edema versus atypical infection."  No respiratory symptoms.  No fever but some leukocytosis on presentation.   Procal neg. --Empirically started on Rocephin and Zithromax for possible atypical pneumonia. PLAN: --d/c ceftriaxone --cont azithromycin for now   DVT prophylaxis: Lovenox SQ Code Status: Full code  Family Communication:  Status is: inpatient Dispo:   The patient is from: home Anticipated d/c is to: home Anticipated d/c date is: tomorrow Patient currently is not medically stable to d/c due to: on IVF for sever lactic acidosis.   Subjective and Interval History:  Pt complained of brown watery diarrhea up to 6 episodes per day, POA.  C diff neg.  Reported feeling better since presentation.   Objective: Vitals:   01/03/20 1612 01/03/20 2019 01/03/20 2135 01/03/20 2345  BP: (!) 158/103 (!) 150/100 (!) 152/100 (!) 151/100  Pulse: 88 81 81 90  Resp: 16 16 16 16   Temp: 98.6 F (37 C) 98.4 F (36.9 C) 98.6 F (37 C) 98.2 F (36.8 C)  TempSrc: Oral Oral Oral   SpO2: 98% 99% 97% 98%  Weight:      Height:        Intake/Output Summary (Last 24 hours) at 01/04/2020 0350 Last data filed at 01/04/2020 0318 Gross per 24 hour  Intake 3843.57 ml  Output --  Net 3843.57 ml   Filed Weights   01/02/20 1614  Weight: 90.7 kg    Examination:   Constitutional: NAD, AAOx3 HEENT: conjunctivae and lids normal, EOMI CV: No cyanosis.   RESP: normal respiratory effort, on RA Extremities: No effusions, edema in BLE SKIN: warm, dry and intact Neuro: II - XII grossly intact.   Psych: Normal mood and affect.  Appropriate judgement and reason   Data Reviewed: I have personally reviewed following labs  and imaging studies  CBC: Recent Labs  Lab 01/02/20 1621 01/02/20 2237 01/03/20 0322  WBC 11.8* 9.3 6.9  NEUTROABS 9.5*  --   --   HGB 16.1 14.0 13.5  HCT 44.5 38.4* 37.4*  MCV 92.7 91.6 92.1  PLT 344 237 379   Basic Metabolic Panel: Recent Labs  Lab 01/02/20 1621 01/02/20 1706 01/02/20 2237 01/03/20 0322  NA 137  --   --  133*  K 3.7  --   --  3.8  CL 100  --   --  101   CO2 13*  --   --  22  GLUCOSE 85  --   --  107*  BUN 13  --   --  10  CREATININE 1.06  --  0.83 0.80  CALCIUM 8.3*  --   --  8.0*  MG  --  2.2  --   --    GFR: Estimated Creatinine Clearance: 109.9 mL/min (by C-G formula based on SCr of 0.8 mg/dL). Liver Function Tests: Recent Labs  Lab 01/02/20 1621 01/03/20 0322  AST 139* 113*  ALT 73* 62*  ALKPHOS 83 66  BILITOT 1.7* 2.3*  PROT 7.3 6.3*  ALBUMIN 4.4 3.6   No results for input(s): LIPASE, AMYLASE in the last 168 hours. No results for input(s): AMMONIA in the last 168 hours. Coagulation Profile: Recent Labs  Lab 01/02/20 1706  INR 1.1   Cardiac Enzymes: No results for input(s): CKTOTAL, CKMB, CKMBINDEX, TROPONINI in the last 168 hours. BNP (last 3 results) No results for input(s): PROBNP in the last 8760 hours. HbA1C: No results for input(s): HGBA1C in the last 72 hours. CBG: No results for input(s): GLUCAP in the last 168 hours. Lipid Profile: No results for input(s): CHOL, HDL, LDLCALC, TRIG, CHOLHDL, LDLDIRECT in the last 72 hours. Thyroid Function Tests: No results for input(s): TSH, T4TOTAL, FREET4, T3FREE, THYROIDAB in the last 72 hours. Anemia Panel: No results for input(s): VITAMINB12, FOLATE, FERRITIN, TIBC, IRON, RETICCTPCT in the last 72 hours. Sepsis Labs: Recent Labs  Lab 01/02/20 1621 01/02/20 1706 01/02/20 1955 01/03/20 0322  PROCALCITON <0.10  --   --   --   LATICACIDVEN >11.0* 11.0* 7.8* 2.3*    Recent Results (from the past 240 hour(s))  Blood Culture (routine x 2)     Status: None (Preliminary result)   Collection Time: 01/02/20  5:06 PM   Specimen: BLOOD  Result Value Ref Range Status   Specimen Description BLOOD LEFT ANTECUBITAL  Final   Special Requests   Final    BOTTLES DRAWN AEROBIC AND ANAEROBIC Blood Culture adequate volume   Culture   Final    NO GROWTH < 12 HOURS Performed at [facility], [address] [place], [place] [address]    Report Status PENDING   Incomplete  Blood Culture (routine x 2)     Status: None (Preliminary result)   Collection Time: 01/02/20  5:06 PM   Specimen: BLOOD  Result Value Ref Range Status   Specimen Description BLOOD RIGHT ANTECUBITAL  Final   Special Requests   Final    BOTTLES DRAWN AEROBIC AND ANAEROBIC Blood Culture adequate volume   Culture   Final    NO GROWTH < 24 HOURS Performed at [facility], [address] [place], [place] [address]    Report Status PENDING  Incomplete  Resp Panel by RT PCR (RSV, Flu A&B, Covid) - Nasopharyngeal Swab     Status: None   Collection  Time: 01/02/20  5:06 PM   Specimen: Nasopharyngeal Swab  Result Value Ref Range Status   SARS Coronavirus 2 by RT PCR NEGATIVE NEGATIVE Final    Comment: (NOTE) SARS-CoV-2 target nucleic acids are NOT DETECTED.  The SARS-CoV-2 RNA is generally detectable in upper respiratoy specimens during the acute phase of infection. The lowest concentration of SARS-CoV-2 viral copies this assay can detect is 131 copies/mL. A negative result does not preclude SARS-Cov-2 infection and should not be used as the sole basis for treatment or other patient management decisions. A negative result may occur with  improper specimen collection/handling, submission of specimen other than nasopharyngeal swab, presence of viral mutation(s) within the areas targeted by this assay, and inadequate number of viral copies (<131 copies/mL). A negative result must be combined with clinical observations, patient history, and epidemiological information. The expected result is Negative.  Fact Sheet for Patients:  PinkCheek.be  Fact Sheet for Healthcare Providers:  GravelBags.it  This test is no t yet approved or cleared by the [place] FDA and  has been authorized for detection and/or diagnosis of SARS-CoV-2 by FDA under an Emergency Use Authorization (EUA). This EUA will remain  in effect  (meaning this test can be used) for the duration of the COVID-19 declaration under Section 564(b)(1) of the Act, 21 U.S.C. section 360bbb-3(b)(1), unless the authorization is terminated or revoked sooner.     Influenza A by PCR NEGATIVE NEGATIVE Final   Influenza B by PCR NEGATIVE NEGATIVE Final    Comment: (NOTE) The Xpert Xpress SARS-CoV-2/FLU/RSV assay is intended as an aid in  the diagnosis of influenza from Nasopharyngeal swab specimens and  should not be used as a sole basis for treatment. Nasal washings and  aspirates are unacceptable for Xpert Xpress SARS-CoV-2/FLU/RSV  testing.  Fact Sheet for Patients: PinkCheek.be  Fact Sheet for Healthcare Providers: GravelBags.it  This test is not yet approved or cleared by the [place] FDA and  has been authorized for detection and/or diagnosis of SARS-CoV-2 by  FDA under an Emergency Use Authorization (EUA). This EUA will remain  in effect (meaning this test can be used) for the duration of the  Covid-19 declaration under Section 564(b)(1) of the Act, 21  U.S.C. section 360bbb-3(b)(1), unless the authorization is  terminated or revoked.    Respiratory Syncytial Virus by PCR NEGATIVE NEGATIVE Final    Comment: (NOTE) Fact Sheet for Patients: PinkCheek.be  Fact Sheet for Healthcare Providers: GravelBags.it  This test is not yet approved or cleared by the [place] FDA and  has been authorized for detection and/or diagnosis of SARS-CoV-2 by  FDA under an Emergency Use Authorization (EUA). This EUA will remain  in effect (meaning this test can be used) for the duration of the  COVID-19 declaration under Section 564(b)(1) of the Act, 21 U.S.C.  section 360bbb-3(b)(1), unless the authorization is terminated or  revoked. Performed at [facility], [address] [place], [place] [address]   Urine  culture     Status: None   Collection Time: 01/02/20  7:55 PM   Specimen: Urine, Random  Result Value Ref Range Status   Specimen Description   Final    URINE, RANDOM Performed at [facility], [address] [place], [place] [address]    Special Requests   Final    NONE Performed at [facility], [address], [place], [place] [address]    Culture   Final    NO GROWTH Performed at  [facility], [place] [address] [place], [place] [address]    Report Status 01/03/2020 FINAL  Final  C Difficile Quick Screen w PCR reflex     Status: None   Collection Time: 01/03/20  9:24 AM   Specimen: STOOL  Result Value Ref Range Status   C Diff antigen NEGATIVE NEGATIVE Final   C Diff toxin NEGATIVE NEGATIVE Final   C Diff interpretation No C. difficile detected.  Final    Comment: Performed at [facility], [address], [place], [place] [address]      Radiology Studies: DG Chest 2 View  Result Date: 01/02/2020 CLINICAL DATA:  Chills fever EXAM: CHEST - 2 VIEW COMPARISON:  None. FINDINGS: The cardiomediastinal silhouette is normal in contour.Surgical sutures project over the RIGHT lung. No pleural effusion. No pneumothorax. Mild peripheral interstitial prominence. Visualized abdomen is unremarkable. Mild degenerative changes of the thoracic spine. IMPRESSION: Mild peripheral interstitial prominence, which may reflect mild edema versus atypical infection. Electronically Signed   By: Valentino Saxon MD   On: 01/02/2020 16:37     Scheduled Meds: . amLODipine  10 mg Oral Daily  . enoxaparin (LOVENOX) injection  40 mg Subcutaneous Q24H  . folic acid  1 mg Oral Daily  . metoprolol succinate  25 mg Oral Daily  . pantoprazole  40 mg Oral Daily  . simvastatin  10 mg Oral QHS  . thiamine  100 mg Oral Daily   Continuous Infusions: . azithromycin 500 mg (01/03/20 1642)  . lactated ringers 125 mL/hr at 01/03/20 2053     LOS: 2 days     Enzo Bi,  MD [facility] If 7PM-7AM, please contact night-coverage 01/04/2020, 3:50 AM

## 2020-01-04 ENCOUNTER — Inpatient Hospital Stay: Payer: 59

## 2020-01-04 LAB — HEPATIC FUNCTION PANEL
ALT: 45 U/L — ABNORMAL HIGH (ref 0–44)
AST: 74 U/L — ABNORMAL HIGH (ref 15–41)
Albumin: 3.5 g/dL (ref 3.5–5.0)
Alkaline Phosphatase: 63 U/L (ref 38–126)
Bilirubin, Direct: 0.3 mg/dL — ABNORMAL HIGH (ref 0.0–0.2)
Indirect Bilirubin: 1.6 mg/dL — ABNORMAL HIGH (ref 0.3–0.9)
Total Bilirubin: 1.9 mg/dL — ABNORMAL HIGH (ref 0.3–1.2)
Total Protein: 5.9 g/dL — ABNORMAL LOW (ref 6.5–8.1)

## 2020-01-04 LAB — BASIC METABOLIC PANEL
Anion gap: 10 (ref 5–15)
BUN: <5 mg/dL — ABNORMAL LOW (ref 8–23)
CO2: 25 mmol/L (ref 22–32)
Calcium: 8.4 mg/dL — ABNORMAL LOW (ref 8.9–10.3)
Chloride: 100 mmol/L (ref 98–111)
Creatinine, Ser: 0.86 mg/dL (ref 0.61–1.24)
GFR, Estimated: >60 mL/min (ref >60)
Glucose, Bld: 87 mg/dL (ref 70–99)
Potassium: 3.2 mmol/L — ABNORMAL LOW (ref 3.5–5.1)
Sodium: 135 mmol/L (ref 135–145)

## 2020-01-04 LAB — CBC
HCT: 37 % — ABNORMAL LOW (ref 39.0–52.0)
Hemoglobin: 13 g/dL (ref 13.0–17.0)
MCH: 33.3 pg (ref 26.0–34.0)
MCHC: 35.1 g/dL (ref 30.0–36.0)
MCV: 94.9 fL (ref 80.0–100.0)
Platelets: 167 10*3/uL (ref 150–400)
RBC: 3.9 MIL/uL — ABNORMAL LOW (ref 4.22–5.81)
RDW: 14.2 % (ref 11.5–15.5)
WBC: 3.9 10*3/uL — ABNORMAL LOW (ref 4.0–10.5)
nRBC: 0 % (ref 0.0–0.2)

## 2020-01-04 LAB — LACTIC ACID, PLASMA: Lactic Acid, Venous: 1.1 mmol/L (ref 0.5–1.9)

## 2020-01-04 LAB — MAGNESIUM: Magnesium: 2.2 mg/dL (ref 1.7–2.4)

## 2020-01-04 LAB — CK: Total CK: 185 U/L (ref 49–397)

## 2020-01-04 IMAGING — DX DG CHEST 1V PORT
1 series · 2 of 2 positions shown · non-contrast
Comparison: [DATE]

CLINICAL DATA: Sepsis. Shortness of breath and nausea. Previous
surgery for a spontaneous pneumothorax.

EXAM:
PORTABLE CHEST 1 VIEW

[Series 1: chest ap · 0.14mm/px · 2 of 2 slices shown]
[im 1/2]
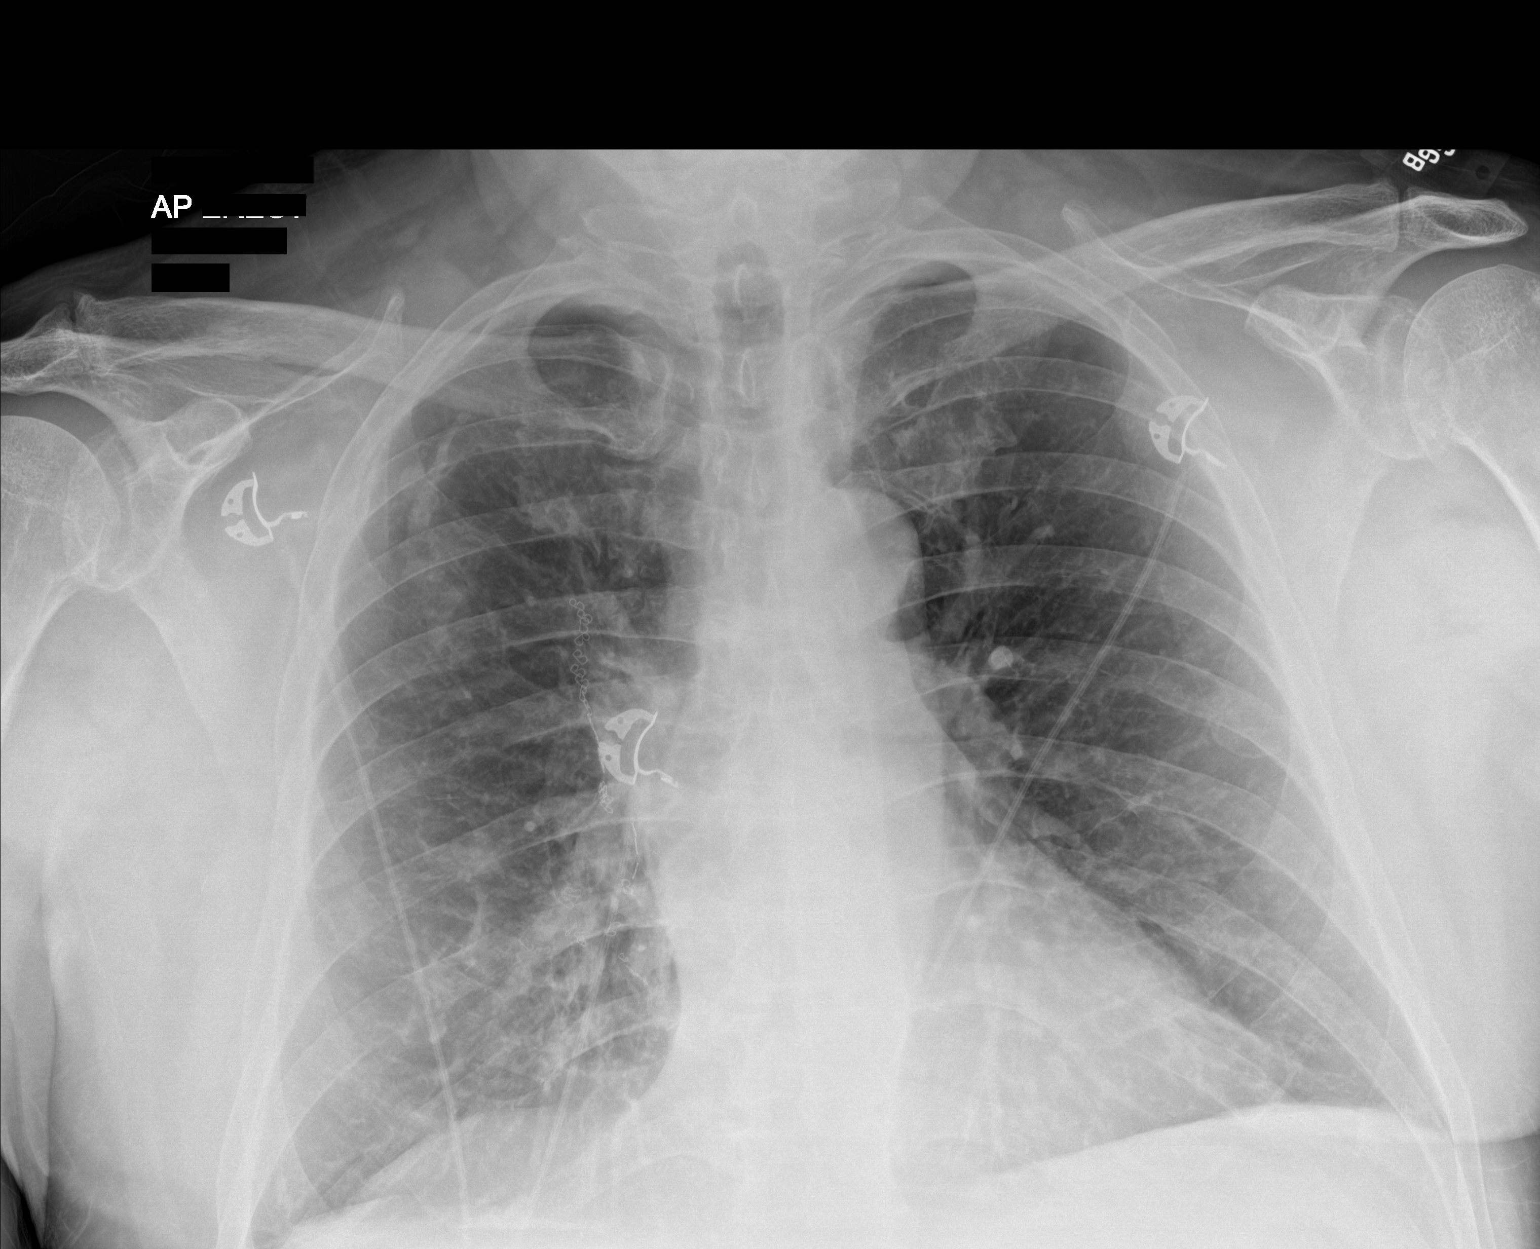
[im 2/2]
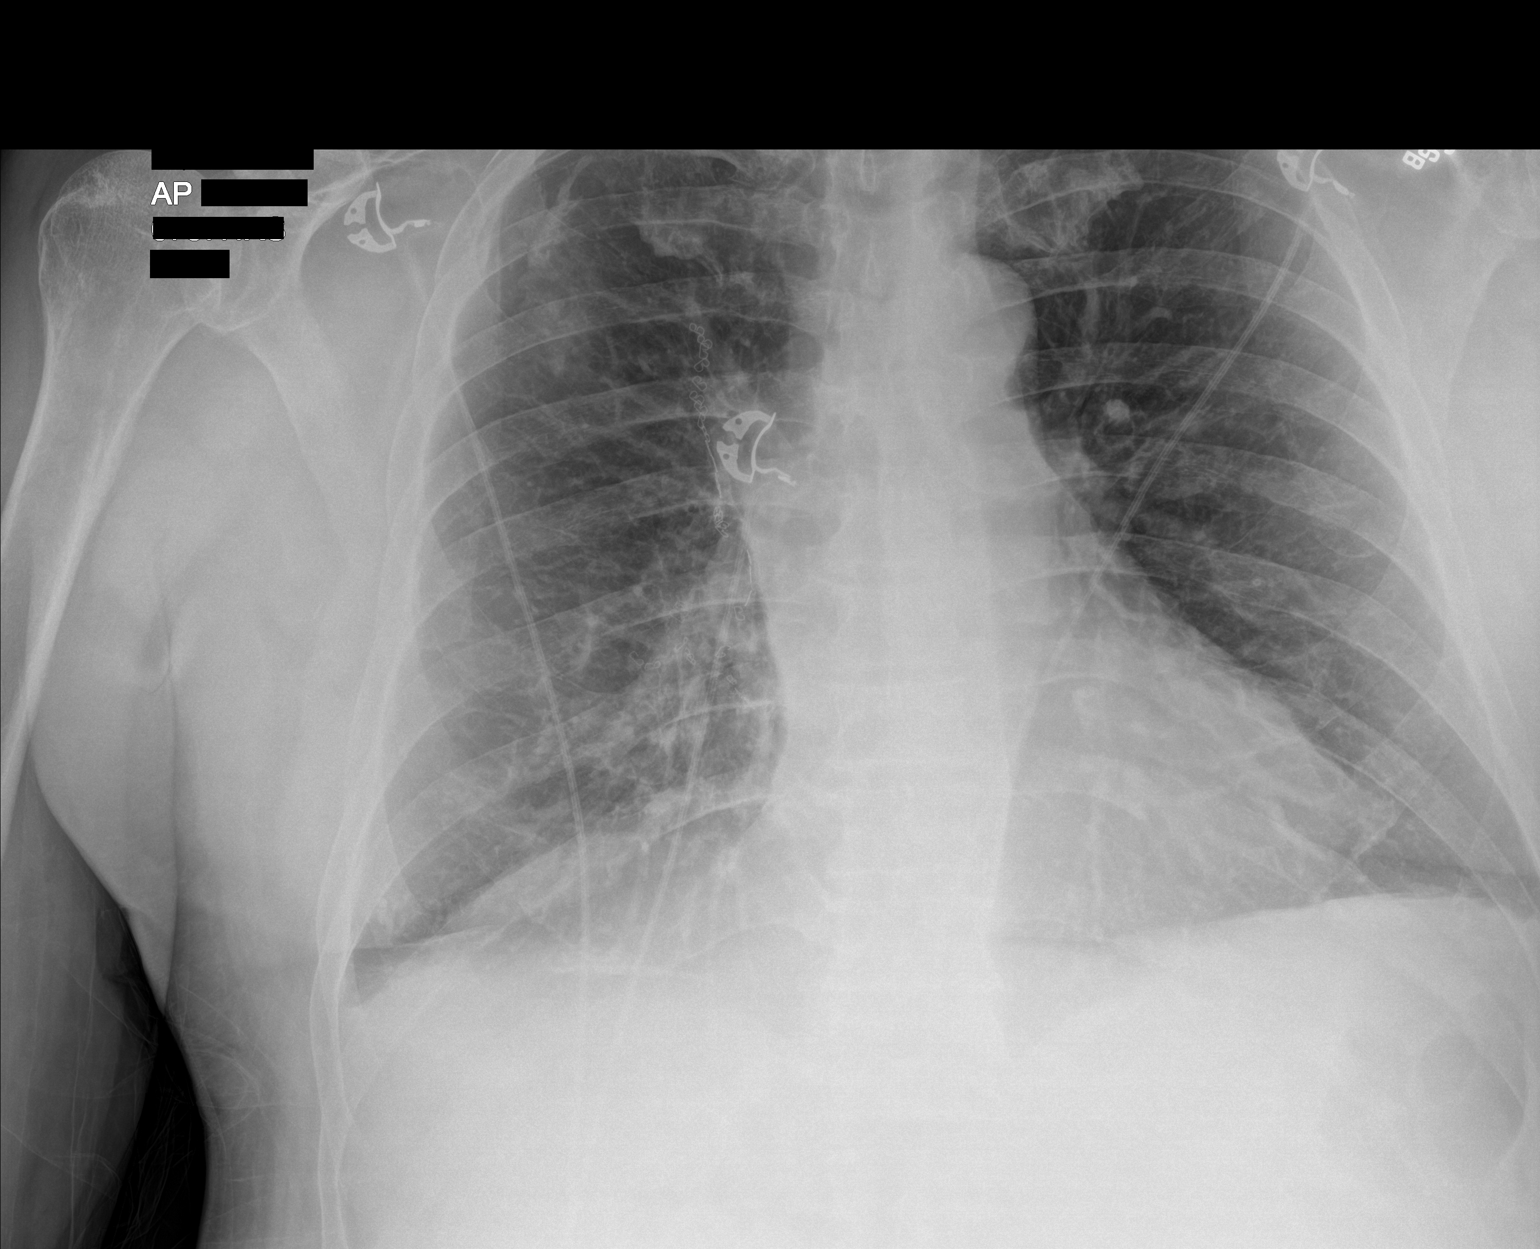

[2 of 2 positions shown; findings below may reference images not displayed]

FINDINGS: Stable surgical staples in the right hilar region. The heart remains
normal in size and the aorta is mildly tortuous. Vertically oriented
linear and nodular scarring or calcified pleural plaque in the upper
outer right lung zone is unchanged. Otherwise, clear lungs with
normal vascularity. Moderate right AC joint degenerative changes.
Possible minimal right pleural effusion.
IMPRESSION: 1. Possible minimal right pleural effusion.
2. Stable postoperative changes in the right hilar region.

## 2020-01-04 MED ORDER — AZITHROMYCIN 500 MG PO TABS: 500.0000 mg | ORAL_TABLET | Freq: Every day | ORAL | 0 refills | Status: AC

## 2020-01-04 MED ORDER — HYDROCHLOROTHIAZIDE 25 MG PO TABS
25.0000 mg | ORAL_TABLET | Freq: Every day | ORAL | Status: DC
  Administered 2020-01-04: 25 mg via ORAL
  Filled 2020-01-04: qty 1

## 2020-01-04 MED ORDER — AMOXICILLIN 500 MG PO CAPS: 1000.0000 mg | ORAL_CAPSULE | Freq: Three times a day (TID) | ORAL | 0 refills | Status: AC

## 2020-01-04 MED ORDER — LOPERAMIDE HCL 2 MG PO CAPS: 2.0000 mg | ORAL_CAPSULE | Freq: Two times a day (BID) | ORAL | 0 refills | Status: AC | PRN

## 2020-01-04 MED ORDER — POTASSIUM CHLORIDE CRYS ER 20 MEQ PO TBCR
40.0000 meq | EXTENDED_RELEASE_TABLET | ORAL | Status: AC
  Administered 2020-01-04 (×2): 40 meq via ORAL
  Filled 2020-01-04 (×2): qty 2

## 2020-01-04 MED ORDER — THIAMINE HCL 100 MG PO TABS: 100.0000 mg | ORAL_TABLET | Freq: Every day | ORAL | 0 refills | Status: AC

## 2020-01-04 MED ORDER — LOSARTAN POTASSIUM-HCTZ 100-25 MG PO TABS: 1.0000 | ORAL_TABLET | Freq: Every day | ORAL | Status: DC

## 2020-01-04 MED ORDER — SODIUM CHLORIDE 0.9 % IV SOLN
1.0000 g | INTRAVENOUS | Status: DC
  Administered 2020-01-04: 1 g via INTRAVENOUS
  Filled 2020-01-04 (×2): qty 10

## 2020-01-04 MED ORDER — FOLIC ACID 1 MG PO TABS: 1.0000 mg | ORAL_TABLET | Freq: Every day | ORAL | 0 refills | Status: AC

## 2020-01-04 MED ORDER — LOSARTAN POTASSIUM 50 MG PO TABS
100.0000 mg | ORAL_TABLET | Freq: Every day | ORAL | Status: DC
  Administered 2020-01-04: 100 mg via ORAL
  Filled 2020-01-04: qty 2

## 2020-01-04 NOTE — Discharge Summary (Signed)
Physician Discharge Summary  DAMARKO STITELY ULA:[id number] DOB: 24-Nov-1960 DOA: 01/02/2020  PCP: Crecencio Mc, MD  Admit date: 01/02/2020 Discharge date: 01/04/2020  Time spent: 40 minutes  Recommendations for Outpatient Follow-up:  1. Follow outpatient CBC/CMP - attention to LFT's 2. Follow repeat CXR outpatient - attention to possible minimal R effusion 3. Follow blood pressure outpatient, home BP meds resumed 4. Continue statin for now, consider d/c if LFT's>3x normal  Discharge Diagnoses:  Principal Problem:   Lactic acid acidosis Active Problems:   Hyperlipidemia with target LDL less than 100   Alcohol abuse   Essential hypertension, benign   Discharge Condition: stable  Diet recommendation: heart healthy  Filed Weights   01/02/20 1614  Weight: 90.7 kg    History of present illness:  Jonathan Simon 59 y.o.malewith medical history significant ofrecent COVID-19 infection about 6 weeks ago, history of maxillary sinus, GERD, essential hypertension, history of spontaneous pneumothorax, hyperlipidemia presenting with 3 days of bilateral chest pain weakness and lightheadedness.  Denied any nausea vomiting, but having profuse diarrhea. He has decreased appetite. He felt similarly when he had his COVID-19 infection in September. COVID-19 screen neg on presentation.  Denied any other complaints. He was found to have markedly elevated lactic acid and some leukocytosis.   He's improved with treatment for community acquired pneumonia complicated by septic shock.  Hospital Course:  Septic Shock secondary to community acquired pneumonia Pt met criteria for sepsis on admission with HR>90, leukocytosis, and evidence of infection with mild peripheral interstitial prominence (edema vs atypical infection).  Lactate was >11 on presentation, c/w shock.   Blood cx NGTD x2 Negative covid and influenza Urine strep, legionella not obtained, being d/c'd today on  abx Continue abx on discharge, will discharge with amoxicillin/azithromycin CXR 11/5 with possible minimal R effusion -> needs follow outpatient (discussed with pt)  Lactic acidosis: likely 2/2 sepsis and exacerbated by BP meds -> resolved (he presented with lightheadedness which I suspect was 2/2 hypovolemia/hypotension - though no objective hypotension noted) I think ok to continue BP meds for now as BP continues to be on high side and his sx have resolved Negative orthostatics today on d/c  Elevated LFT's: likely related to etoh use, follow outpatient with PCP  Hypertension: continue BP meds, stop and call md if si/sx hypotension (discussed with pt)  #3 hyperlipidemia: --cont home statin  #5 history of alcohol abuse: Patient denied daily alcohol intake. Mainly weekends. Will monitor if any signs of withdrawal will treat.  Procedures:  none  Consultations:  none  Discharge Exam: Vitals:   01/04/20 0811 01/04/20 1108  BP: (!) 151/108 (!) 144/108  Pulse: 92 71  Resp: 16 17  Temp: 98.4 F (36.9 C) 98.4 F (36.9 C)  SpO2: 100% 98%   Feeling better, ready for discharge No new complaints No LH  General: No acute distress. Cardiovascular: Heart sounds show Delson Dulworth regular rate, and rhythm. Lungs: Clear to auscultation bilaterally. Abdomen: Soft, nontender, nondistended Neurological: Alert and oriented 3. Moves all extremities 4. Cranial nerves II through XII grossly intact. Skin: Warm and dry. No rashes or lesions. Extremities: No clubbing or cyanosis. No edema.  Discharge Instructions   Discharge Instructions    Call MD for:  difficulty breathing, headache or visual disturbances   Complete by: As directed    Call MD for:  extreme fatigue   Complete by: As directed    Call MD for:  hives   Complete by: As directed  Call MD for:  persistant dizziness or light-headedness   Complete by: As directed    Call MD for:  persistant nausea and vomiting   Complete by:  As directed    Call MD for:  redness, tenderness, or signs of infection (pain, swelling, redness, odor or green/yellow discharge around incision site)   Complete by: As directed    Call MD for:  severe uncontrolled pain   Complete by: As directed    Call MD for:  temperature >100.4   Complete by: As directed    Diet - low sodium heart healthy   Complete by: As directed    Discharge instructions   Complete by: As directed    You were seen for sepsis from pneumonia.  You've improved.  We'll send you home with another 3 days of antibiotics.  Please follow up with your PCP as an outpatient.    I think your blood pressure was probably low when you were admitted because of your infection and your blood pressure medicines.  I think it's ok to resume these medicines, but please follow your symptoms closely.  If you have lightheadedness or dizziness, stop your meds and call your doctor or come to the hospital.    You need Leather Estis repeat x ray of your chest in Jaedon Siler couple weeks to make sure the fluid resolves on your right lung.    You need repeat liver enzyme tests, these were elevated while you were here.  That may be related to your alcohol use.  Please follow up repeat labs with your PCP.  Return for new, recurrent, or worsening symptoms.  Please ask your PCP to request records from this hospitalization so they know what was done and what the next steps will be.   Increase activity slowly   Complete by: As directed      Allergies as of 01/04/2020      Reactions   Latex Rash      Medication List    STOP taking these medications   ibuprofen 600 MG tablet Commonly known as: ADVIL     TAKE these medications   albuterol 108 (90 Base) MCG/ACT inhaler Commonly known as: VENTOLIN HFA Inhale 2 puffs into the lungs every 6 (six) hours as needed for wheezing.   amLODipine 10 MG tablet Commonly known as: NORVASC Take 1 tablet (10 mg total) by mouth daily.   amoxicillin 500 MG capsule Commonly  known as: AMOXIL Take 2 capsules (1,000 mg total) by mouth 3 (three) times daily for 3 days.   azithromycin 500 MG tablet Commonly known as: Zithromax Take 1 tablet (500 mg total) by mouth daily for 3 days. Take 1 tablet daily for 3 days.   Fish Oil 1200 MG Caps Take 1,200 mg by mouth daily.   fluticasone 27.5 MCG/SPRAY nasal spray Commonly known as: VERAMYST Place 2 sprays into the nose daily as needed for allergies.   folic acid 1 MG tablet Commonly known as: FOLVITE Take 1 tablet (1 mg total) by mouth daily. Start taking on: January 05, 2020   loperamide 2 MG capsule Commonly known as: IMODIUM Take 1 capsule (2 mg total) by mouth 2 (two) times daily as needed for up to 5 days for diarrhea or loose stools.   losartan-hydrochlorothiazide 100-25 MG tablet Commonly known as: HYZAAR Take 1 tablet by mouth daily.   metoprolol succinate 25 MG 24 hr tablet Commonly known as: TOPROL-XL Take 1 tablet (25 mg total) by mouth daily.   multivitamin  capsule Take 1 capsule by mouth daily.   niacin 500 MG tablet Take 500 mg by mouth daily with breakfast.   omeprazole 20 MG capsule Commonly known as: PRILOSEC Take 20 mg by mouth daily.   simvastatin 20 MG tablet Commonly known as: ZOCOR TAKE 1 TABLET BY MOUTH DAILY AT 6 PM What changed:   how much to take  how to take this  when to take this   thiamine 100 MG tablet Take 1 tablet (100 mg total) by mouth daily. Start taking on: January 05, 2020      Allergies  Allergen Reactions  . Latex Rash      The results of significant diagnostics from this hospitalization (including imaging, microbiology, ancillary and laboratory) are listed below for reference.    Significant Diagnostic Studies: DG Chest 2 View  Result Date: 01/02/2020 CLINICAL DATA:  Chills fever EXAM: CHEST - 2 VIEW COMPARISON:  None. FINDINGS: The cardiomediastinal silhouette is normal in contour.Surgical sutures project over the RIGHT lung. No pleural  effusion. No pneumothorax. Mild peripheral interstitial prominence. Visualized abdomen is unremarkable. Mild degenerative changes of the thoracic spine. IMPRESSION: Mild peripheral interstitial prominence, which may reflect mild edema versus atypical infection. Electronically Signed   By: Meda Klinefelter MD   On: 01/02/2020 16:37   DG Chest Port 1 View  Result Date: 01/04/2020 CLINICAL DATA:  Sepsis. Shortness of breath and nausea. Previous surgery for Darvell Monteforte spontaneous pneumothorax. EXAM: PORTABLE CHEST 1 VIEW COMPARISON:  01/02/2020 FINDINGS: Stable surgical staples in the right hilar region. The heart remains normal in size and the aorta is mildly tortuous. Vertically oriented linear and nodular scarring or calcified pleural plaque in the upper outer right lung zone is unchanged. Otherwise, clear lungs with normal vascularity. Moderate right AC joint degenerative changes. Possible minimal right pleural effusion. IMPRESSION: 1. Possible minimal right pleural effusion. 2. Stable postoperative changes in the right hilar region. Electronically Signed   By: Beckie Salts M.D.   On: 01/04/2020 08:12    Microbiology: Recent Results (from the past 240 hour(s))  Blood Culture (routine x 2)     Status: None (Preliminary result)   Collection Time: 01/02/20  5:06 PM   Specimen: BLOOD  Result Value Ref Range Status   Specimen Description BLOOD LEFT ANTECUBITAL  Final   Special Requests   Final    BOTTLES DRAWN AEROBIC AND ANAEROBIC Blood Culture adequate volume   Culture   Final    NO GROWTH 2 DAYS Performed at [facility], [address], [place], [place] [address]    Report Status PENDING  Incomplete  Blood Culture (routine x 2)     Status: None (Preliminary result)   Collection Time: 01/02/20  5:06 PM   Specimen: BLOOD  Result Value Ref Range Status   Specimen Description BLOOD RIGHT ANTECUBITAL  Final   Special Requests   Final    BOTTLES DRAWN AEROBIC AND ANAEROBIC Blood Culture  adequate volume   Culture   Final    NO GROWTH 2 DAYS Performed at [facility], [address], [place], [place] [address]    Report Status PENDING  Incomplete  Resp Panel by RT PCR (RSV, Flu Levis Nazir&B, Covid) - Nasopharyngeal Swab     Status: None   Collection Time: 01/02/20  5:06 PM   Specimen: Nasopharyngeal Swab  Result Value Ref Range Status   SARS Coronavirus 2 by RT PCR NEGATIVE NEGATIVE Final    Comment: (NOTE) SARS-CoV-2 target nucleic acids are NOT  DETECTED.  The SARS-CoV-2 RNA is generally detectable in upper respiratoy specimens during the acute phase of infection. The lowest concentration of SARS-CoV-2 viral copies this assay can detect is 131 copies/mL. Joleene Burnham negative result does not preclude SARS-Cov-2 infection and should not be used as the sole basis for treatment or other patient management decisions. Cresta Riden negative result may occur with  improper specimen collection/handling, submission of specimen other than nasopharyngeal swab, presence of viral mutation(s) within the areas targeted by this assay, and inadequate number of viral copies (<131 copies/mL). Courtenay Creger negative result must be combined with clinical observations, patient history, and epidemiological information. The expected result is Negative.  Fact Sheet for Patients:  PinkCheek.be  Fact Sheet for Healthcare Providers:  GravelBags.it  This test is no t yet approved or cleared by the [place] FDA and  has been authorized for detection and/or diagnosis of SARS-CoV-2 by FDA under an Emergency Use Authorization (EUA). This EUA will remain  in effect (meaning this test can be used) for the duration of the COVID-19 declaration under Section 564(b)(1) of the Act, 21 U.S.C. section 360bbb-3(b)(1), unless the authorization is terminated or revoked sooner.     Influenza Twan Harkin by PCR NEGATIVE NEGATIVE Final   Influenza B by PCR NEGATIVE NEGATIVE Final     Comment: (NOTE) The Xpert Xpress SARS-CoV-2/FLU/RSV assay is intended as an aid in  the diagnosis of influenza from Nasopharyngeal swab specimens and  should not be used as Iverna Hammac sole basis for treatment. Nasal washings and  aspirates are unacceptable for Xpert Xpress SARS-CoV-2/FLU/RSV  testing.  Fact Sheet for Patients: PinkCheek.be  Fact Sheet for Healthcare Providers: GravelBags.it  This test is not yet approved or cleared by the [place] FDA and  has been authorized for detection and/or diagnosis of SARS-CoV-2 by  FDA under an Emergency Use Authorization (EUA). This EUA will remain  in effect (meaning this test can be used) for the duration of the  Covid-19 declaration under Section 564(b)(1) of the Act, 21  U.S.C. section 360bbb-3(b)(1), unless the authorization is  terminated or revoked.    Respiratory Syncytial Virus by PCR NEGATIVE NEGATIVE Final    Comment: (NOTE) Fact Sheet for Patients: PinkCheek.be  Fact Sheet for Healthcare Providers: GravelBags.it  This test is not yet approved or cleared by the [place] FDA and  has been authorized for detection and/or diagnosis of SARS-CoV-2 by  FDA under an Emergency Use Authorization (EUA). This EUA will remain  in effect (meaning this test can be used) for the duration of the  COVID-19 declaration under Section 564(b)(1) of the Act, 21 U.S.C.  section 360bbb-3(b)(1), unless the authorization is terminated or  revoked. Performed at [facility], [address] [place], [place] [address]   Urine culture     Status: None   Collection Time: 01/02/20  7:55 PM   Specimen: Urine, Random  Result Value Ref Range Status   Specimen Description   Final    URINE, RANDOM Performed at [facility], [address], [place], [place] [address]    Special Requests   Final    NONE Performed at  [facility], [address] [place], [place] [address]    Culture   Final    NO GROWTH Performed at [facility], [place] [address] [place], [place] [address]    Report Status 01/03/2020 FINAL  Final  C Difficile Quick Screen w PCR reflex     Status: None   Collection Time:  01/03/20  9:24 AM   Specimen: STOOL  Result Value Ref Range Status   C Diff antigen NEGATIVE NEGATIVE Final   C Diff toxin NEGATIVE NEGATIVE Final   C Diff interpretation No C. difficile detected.  Final    Comment: Performed at [facility], [address] [place], [place] [address]     Labs: Basic Metabolic Panel: Recent Labs  Lab 01/02/20 1621 01/02/20 1706 01/02/20 2237 01/03/20 0322 01/04/20 0336  NA 137  --   --  133* 135  K 3.7  --   --  3.8 3.2*  CL 100  --   --  101 100  CO2 13*  --   --  22 25  GLUCOSE 85  --   --  107* 87  BUN 13  --   --  10 <5*  CREATININE 1.06  --  0.83 0.80 0.86  CALCIUM 8.3*  --   --  8.0* 8.4*  MG  --  2.2  --   --  2.2   Liver Function Tests: Recent Labs  Lab 01/02/20 1621 01/03/20 0322  AST 139* 113*  ALT 73* 62*  ALKPHOS 83 66  BILITOT 1.7* 2.3*  PROT 7.3 6.3*  ALBUMIN 4.4 3.6   No results for input(s): LIPASE, AMYLASE in the last 168 hours. No results for input(s): AMMONIA in the last 168 hours. CBC: Recent Labs  Lab 01/02/20 1621 01/02/20 2237 01/03/20 0322 01/04/20 0336  WBC 11.8* 9.3 6.9 3.9*  NEUTROABS 9.5*  --   --   --   HGB 16.1 14.0 13.5 13.0  HCT 44.5 38.4* 37.4* 37.0*  MCV 92.7 91.6 92.1 94.9  PLT 344 237 223 167   Cardiac Enzymes: Recent Labs  Lab 01/04/20 0427  CKTOTAL 185   BNP: BNP (last 3 results) Recent Labs    01/02/20 1706  BNP 36.0    ProBNP (last 3 results) No results for input(s): PROBNP in the last 8760 hours.  CBG: No results for input(s): GLUCAP in the last 168 hours.     Signed:  Fayrene Helper MD.  [facility] 01/04/2020, 3:41 PM

## 2020-01-04 NOTE — Progress Notes (Signed)
Pt discharge education given to pt.  Verbalized understanding.  Pt transported home via sister.  Work note given to pt.  No s/s of distress, VSS.

## 2020-01-04 NOTE — Plan of Care (Signed)
  Problem: Health Behavior/Discharge Planning: Goal: Ability to manage health-related needs will improve Outcome: Progressing   Problem: Clinical Measurements: Goal: Ability to maintain clinical measurements within normal limits will improve Outcome: Progressing Goal: Will remain free from infection Outcome: Progressing Goal: Diagnostic test results will improve Outcome: Progressing   

## 2020-01-04 NOTE — Progress Notes (Signed)
Patient blood pressure still elevated 151/100. Np Morrison notified. No new orders at this time.

## 2020-01-06 LAB — BLOOD CULTURE ID PANEL (REFLEXED) - BCID2

## 2020-01-06 NOTE — Progress Notes (Signed)
PHARMACY - PHYSICIAN COMMUNICATION CRITICAL VALUE ALERT - BLOOD CULTURE IDENTIFICATION (BCID)  Jonathan Simon is an 59 y.o. male who presented to [facility] on 01/02/2020 with a chief complaint of lactic acidosis, CAP.  Pt was discharged on 11/6.   Assessment:  Staph species in 1 of 4 bottles.  No resistance detected. Most likely a contaminant. (include suspected source if known)  Name of physician (or Provider) Contacted:  Fayrene Helper  Current antibiotics: discharged on azithromycin, amoxicillin  Changes to prescribed antibiotics recommended:  MD not available but messaged him with results, do not expect he would want to change his current abx.  Results for orders placed or performed during the hospital encounter of 01/02/20  Blood Culture ID Panel (Reflexed) (Collected: 01/02/2020  5:06 PM)  Result Value Ref Range   Enterococcus faecalis NOT DETECTED NOT DETECTED   Enterococcus Faecium NOT DETECTED NOT DETECTED   Listeria monocytogenes NOT DETECTED NOT DETECTED   Staphylococcus species NOT DETECTED NOT DETECTED   Staphylococcus aureus (BCID) NOT DETECTED NOT DETECTED   Staphylococcus epidermidis NOT DETECTED NOT DETECTED   Staphylococcus lugdunensis NOT DETECTED NOT DETECTED   Streptococcus species NOT DETECTED NOT DETECTED   Streptococcus agalactiae NOT DETECTED NOT DETECTED   Streptococcus pneumoniae NOT DETECTED NOT DETECTED   Streptococcus pyogenes NOT DETECTED NOT DETECTED   A.calcoaceticus-baumannii NOT DETECTED NOT DETECTED   Bacteroides fragilis NOT DETECTED NOT DETECTED   Enterobacterales NOT DETECTED NOT DETECTED   Enterobacter cloacae complex NOT DETECTED NOT DETECTED   Escherichia coli NOT DETECTED NOT DETECTED   Klebsiella aerogenes NOT DETECTED NOT DETECTED   Klebsiella oxytoca NOT DETECTED NOT DETECTED   Klebsiella pneumoniae NOT DETECTED NOT DETECTED   Proteus species NOT DETECTED NOT DETECTED   Salmonella species NOT DETECTED NOT DETECTED   Serratia  marcescens NOT DETECTED NOT DETECTED   Haemophilus influenzae NOT DETECTED NOT DETECTED   Neisseria meningitidis NOT DETECTED NOT DETECTED   Pseudomonas aeruginosa NOT DETECTED NOT DETECTED   Stenotrophomonas maltophilia NOT DETECTED NOT DETECTED   Candida albicans NOT DETECTED NOT DETECTED   Candida auris NOT DETECTED NOT DETECTED   Candida glabrata NOT DETECTED NOT DETECTED   Candida krusei NOT DETECTED NOT DETECTED   Candida parapsilosis NOT DETECTED NOT DETECTED   Candida tropicalis NOT DETECTED NOT DETECTED   Cryptococcus neoformans/gattii NOT DETECTED NOT DETECTED    [name] 01/06/2020  3:28 AM

## 2020-01-07 ENCOUNTER — Encounter: Payer: 59 | Admitting: Internal Medicine

## 2020-01-07 ENCOUNTER — Telehealth: Payer: Self-pay

## 2020-01-07 LAB — CULTURE, BLOOD (ROUTINE X 2)
Culture: NO GROWTH
Special Requests: ADEQUATE
Special Requests: ADEQUATE

## 2020-01-07 LAB — COOXEMETRY PANEL
Carboxyhemoglobin: 0.4 % — ABNORMAL LOW (ref 0.5–1.5)
Methemoglobin: 0 % (ref 0.0–1.5)

## 2020-01-07 NOTE — Telephone Encounter (Signed)
Transition Care Management Follow-up Telephone Call  Date of discharge and from where: 01/04/20 from [facility].   How have you been since you were released from the hospital? Patient states he still has diarrhea. Today is last dose for antibiotic. Eating yogurt. Staying hydrated. Breathing has improved. Cough improved, non productive. Still a little weak but pacing self to recover. Denies chest pain, lightheadedness, dizziness, nausea, vomiting, hedache. Overall notes doing better.   Any questions or concerns? No  Items Reviewed:  Did the pt receive and understand the discharge instructions provided? Yes , increase activity as tolerated.   Medications obtained and verified? Yes , taking all without issues.   Other? No   Any new allergies since your discharge? No   Dietary orders reviewed? Heart healthy  Do you have support at home? Lives alone. Sister lives nearby and calls daily.   Home Care and Equipment/Supplies: Were home health services ordered? N/A Has the agency set up a time to come to the patient's home? N/A Were any new equipment or medical supplies ordered?  No What is the name of the medical supply agency? N/A Do you have any questions related to the use of the equipment or supplies? No  Functional Questionnaire: (I = Independent and D = Dependent) ADLs: i  Bathing/Dressing- i  Meal Prep- i  Eating- i  Maintaining continence- i  Transferring/Ambulation- i  Managing Meds- i  Follow up appointments reviewed:   PCP Hospital f/u appt confirmed? Yes  Scheduled to see Dr. Derrel Nip on 01/16/20 @ 11:00. Okay to change visit to virtual if symptoms persist. Needs repeat labs, repeat chest xray.   [facility] f/u appt confirmed? No , none requested.    Are transportation arrangements needed? No   If their condition worsens, is the pt aware to call PCP or go to the Emergency Dept.? Yes  Was the patient provided with contact information for the PCP's office or ED?  Yes  Was to pt encouraged to call back with questions or concerns? Yes

## 2020-01-16 ENCOUNTER — Inpatient Hospital Stay: Payer: 59 | Admitting: Internal Medicine

## 2020-01-19 ENCOUNTER — Other Ambulatory Visit: Payer: Self-pay | Admitting: Internal Medicine

## 2020-02-14 ENCOUNTER — Encounter: Payer: 59 | Admitting: Internal Medicine

## 2020-03-14 ENCOUNTER — Telehealth: Payer: Self-pay | Admitting: Internal Medicine

## 2020-03-14 MED ORDER — HYDROCHLOROTHIAZIDE 25 MG PO TABS: 25.0000 mg | ORAL_TABLET | Freq: Every day | ORAL | 3 refills | Status: DC

## 2020-03-14 MED ORDER — LOSARTAN POTASSIUM 100 MG PO TABS: 100.0000 mg | ORAL_TABLET | Freq: Every day | ORAL | 3 refills | Status: DC

## 2020-03-14 NOTE — Telephone Encounter (Signed)
Pharmacy called and would like to know about making some changes to patient's losartan-hydrochlorothiazide (HYZAAR) 100-25 MG tablet, do to a shortage.Please call Dian Situ at  [facility], [phone]

## 2020-03-14 NOTE — Telephone Encounter (Signed)
PLEASE CALL IN LOSARTAN 100 MG  AND HCTZ 25 MG DAILY  90 SUPPLY OF BOTH   ONE TABLET DAILY

## 2020-03-14 NOTE — Telephone Encounter (Signed)
Pharmacy would like to break up medication due to backorder of the hyzaar. Please advise.

## 2020-03-20 ENCOUNTER — Encounter: Payer: 59 | Admitting: Internal Medicine

## 2020-06-03 ENCOUNTER — Emergency Department
Admission: EM | Admit: 2020-06-03 | Discharge: 2020-06-03 | Disposition: A | Discharge status: Home / Self Care | Payer: 59 | Attending: Emergency Medicine | Admitting: Emergency Medicine

## 2020-06-03 ENCOUNTER — Emergency Department: Payer: 59

## 2020-06-03 ENCOUNTER — Other Ambulatory Visit: Payer: Self-pay

## 2020-06-03 DIAGNOSIS — G950 Syringomyelia and syringobulbia: Secondary | ICD-10-CM | POA: Diagnosis not present

## 2020-06-03 DIAGNOSIS — Z91040 Latex allergy status: Secondary | ICD-10-CM | POA: Insufficient documentation

## 2020-06-03 DIAGNOSIS — Z8616 Personal history of COVID-19: Secondary | ICD-10-CM | POA: Insufficient documentation

## 2020-06-03 DIAGNOSIS — M25552 Pain in left hip: Secondary | ICD-10-CM | POA: Diagnosis not present

## 2020-06-03 DIAGNOSIS — I10 Essential (primary) hypertension: Secondary | ICD-10-CM | POA: Insufficient documentation

## 2020-06-03 DIAGNOSIS — Z79899 Other long term (current) drug therapy: Secondary | ICD-10-CM | POA: Diagnosis not present

## 2020-06-03 DIAGNOSIS — M545 Low back pain, unspecified: Secondary | ICD-10-CM | POA: Diagnosis present

## 2020-06-03 DIAGNOSIS — M5440 Lumbago with sciatica, unspecified side: Secondary | ICD-10-CM

## 2020-06-03 LAB — CBC WITH DIFFERENTIAL/PLATELET
Abs Immature Granulocytes: 0.11 10*3/uL — ABNORMAL HIGH (ref 0.00–0.07)
Basophils Absolute: 0.1 10*3/uL (ref 0.0–0.1)
Basophils Relative: 1 %
Eosinophils Absolute: 0 10*3/uL (ref 0.0–0.5)
Eosinophils Relative: 0 %
HCT: 44 % (ref 39.0–52.0)
Hemoglobin: 15.2 g/dL (ref 13.0–17.0)
Immature Granulocytes: 1 %
Lymphocytes Relative: 11 %
Lymphs Abs: 0.9 10*3/uL (ref 0.7–4.0)
MCH: 32.2 pg (ref 26.0–34.0)
MCHC: 34.5 g/dL (ref 30.0–36.0)
MCV: 93.2 fL (ref 80.0–100.0)
Monocytes Absolute: 0.3 10*3/uL (ref 0.1–1.0)
Monocytes Relative: 4 %
Neutro Abs: 7.1 10*3/uL (ref 1.7–7.7)
Neutrophils Relative %: 83 %
Platelets: 217 10*3/uL (ref 150–400)
RBC: 4.72 MIL/uL (ref 4.22–5.81)
RDW: 13.1 % (ref 11.5–15.5)
WBC: 8.6 10*3/uL (ref 4.0–10.5)
nRBC: 0 % (ref 0.0–0.2)

## 2020-06-03 LAB — SEDIMENTATION RATE: Sed Rate: 3 mm/hr (ref 0–20)

## 2020-06-03 LAB — COMPREHENSIVE METABOLIC PANEL
ALT: 38 U/L (ref 0–44)
AST: 37 U/L (ref 15–41)
Albumin: 4.2 g/dL (ref 3.5–5.0)
Alkaline Phosphatase: 72 U/L (ref 38–126)
Anion gap: 13 (ref 5–15)
BUN: 15 mg/dL (ref 8–23)
CO2: 23 mmol/L (ref 22–32)
Calcium: 9 mg/dL (ref 8.9–10.3)
Chloride: 99 mmol/L (ref 98–111)
Creatinine, Ser: 0.82 mg/dL (ref 0.61–1.24)
GFR, Estimated: >60 mL/min (ref >60)
Glucose, Bld: 114 mg/dL — ABNORMAL HIGH (ref 70–99)
Potassium: 4.2 mmol/L (ref 3.5–5.1)
Sodium: 135 mmol/L (ref 135–145)
Total Bilirubin: 1.6 mg/dL — ABNORMAL HIGH (ref 0.3–1.2)
Total Protein: 7.3 g/dL (ref 6.5–8.1)

## 2020-06-03 IMAGING — MR MR THORACIC SPINE WO/W CM
7 of 9 series · 31 of 48 positions shown · IV contrast (gadavist)
Comparison: None.

CLINICAL DATA: Slightly dilated central cord canal noted on lumbar
spine MRI.

EXAM:
MRI CERVICAL WITHOUT CONTRAST AND MRI THORACIC SPINE WITHOUT AND
WITH CONTRAST.
CONTRAST:  10 CC GADAVIST
TECHNIQUE: Multiplanar and multiecho pulse sequences of the cervical spine, to
include the craniocervical junction and cervicothoracic junction,
and the thoracic spine, were obtained. The cervical spine was
performed without contrast and the thoracic spine was performed
without and with contrast.

[Series 16: T1 · sagittal · 5.0mm · 1.88mm/px · 1 of 9 slices shown (1 of 2)]
[im 1/9]
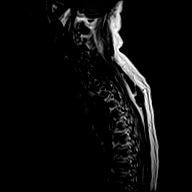

[Series 17: T2 · sagittal · 3.0mm · 1.33mm/px · 3 of 19 slices shown (1 of 2)]
[im 1/19]
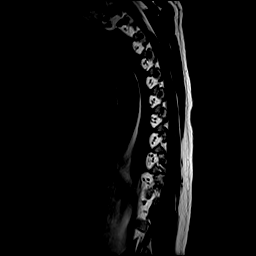
[im 10/19]
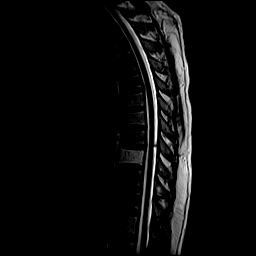
[im 19/19]
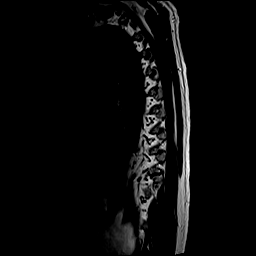

[Series 18: T1 · sagittal · 3.0mm · 1.33mm/px · 4 of 19 slices shown (2 of 2)]
[im 1/19]
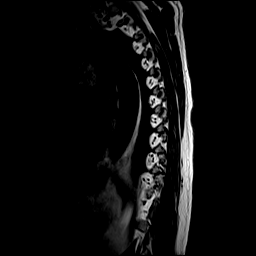
[im 7/19]
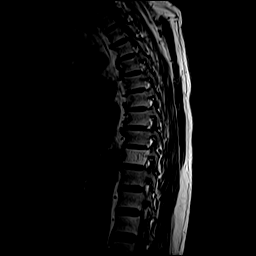
[im 13/19]
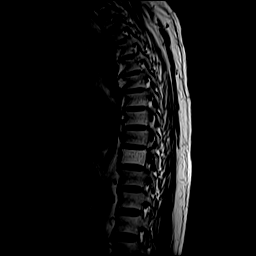
[im 19/19]
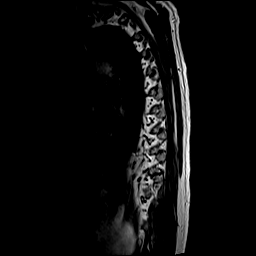

[Series 19: STIR · sagittal · 3.0mm · 0.66mm/px · 4 of 19 slices shown]
[im 1/19]
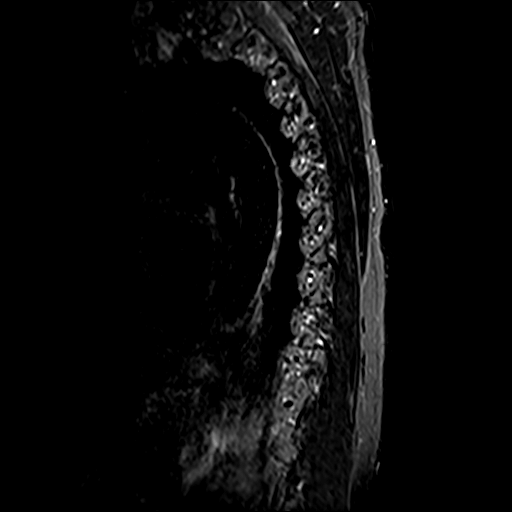
[im 7/19]
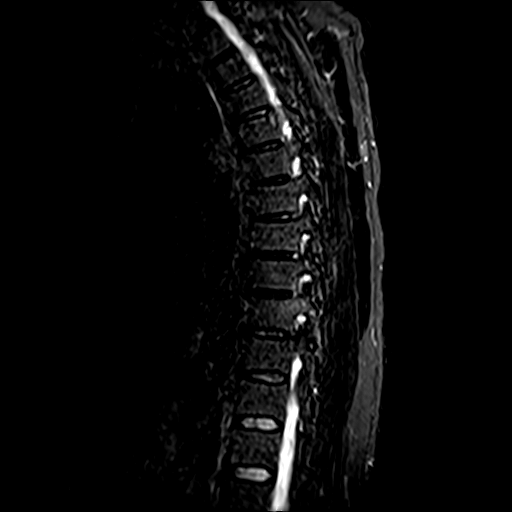
[im 13/19]
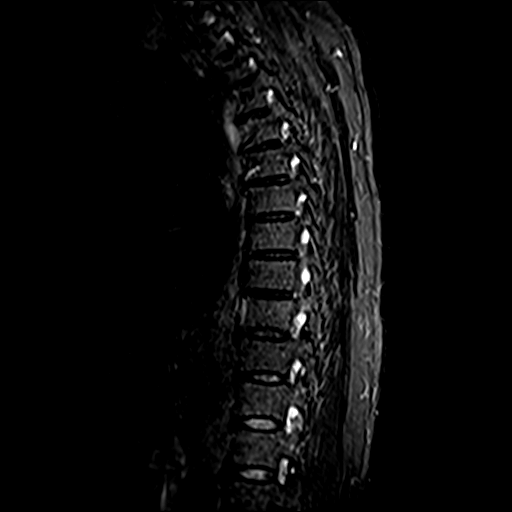
[im 19/19]
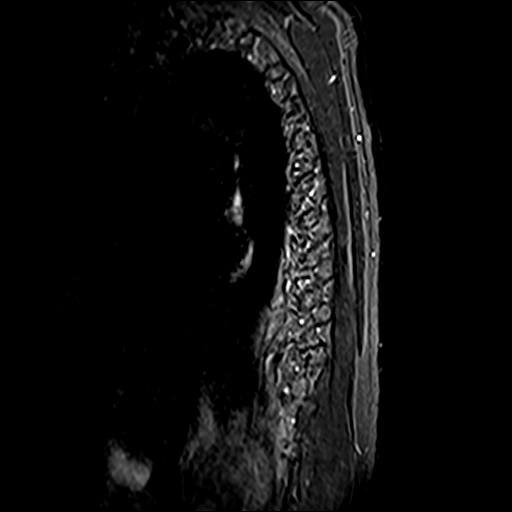

[Series 20: T2 · axial · 4.0mm · 0.59mm/px · z∈[-409,-164]mm · 8 of 39 slices shown (2 of 2)]
[im 1/39]
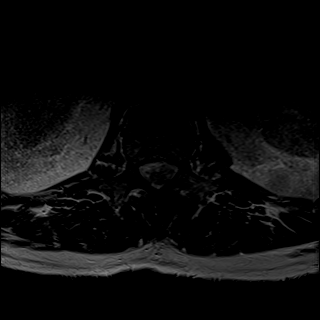
[im 6/39]
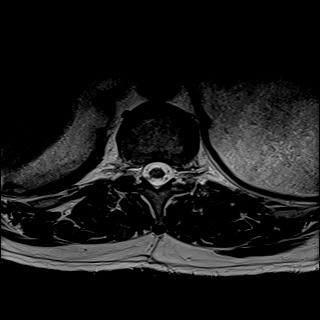
[im 11/39]
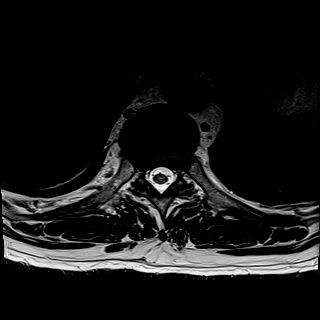
[im 17/39]
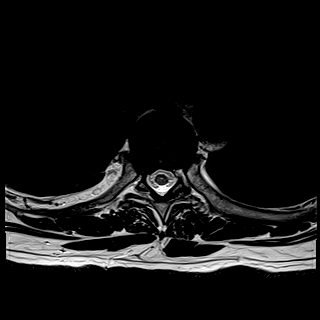
[im 22/39]
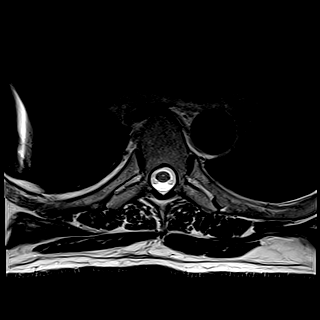
[im 28/39]
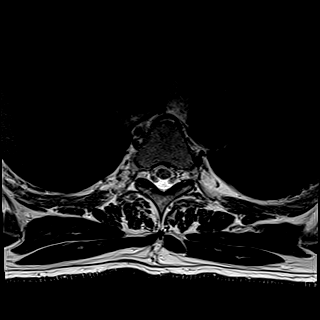
[im 33/39]
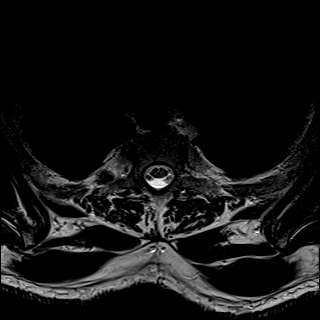
[im 39/39]
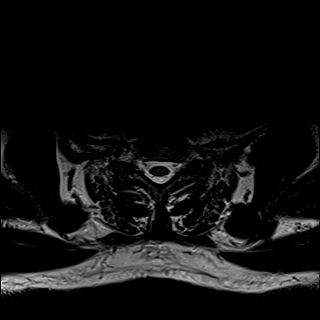

[Series 22: T1 post-contrast · axial · non-contrast · 4.0mm · 0.37mm/px · z∈[-409,-184]mm · 7 of 39 slices shown]
[im 1/39]
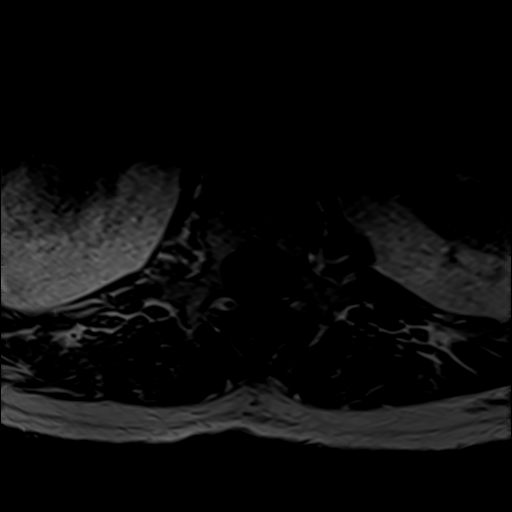
[im 6/39]
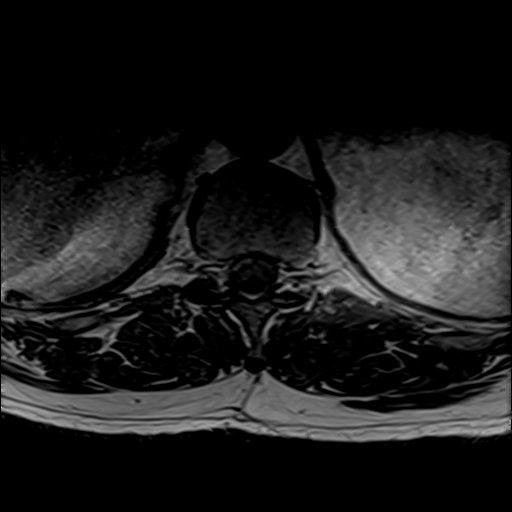
[im 11/39]
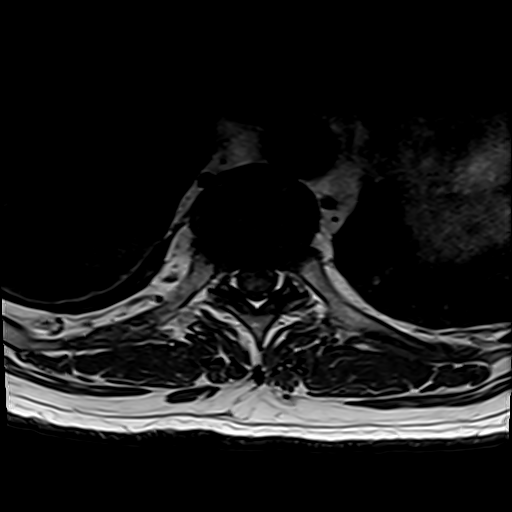
[im 17/39]
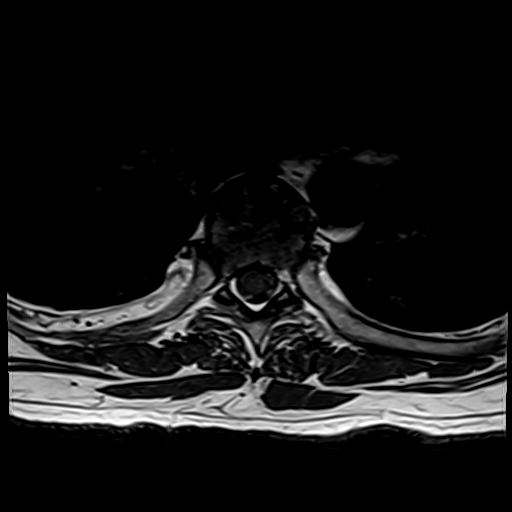
[im 22/39]
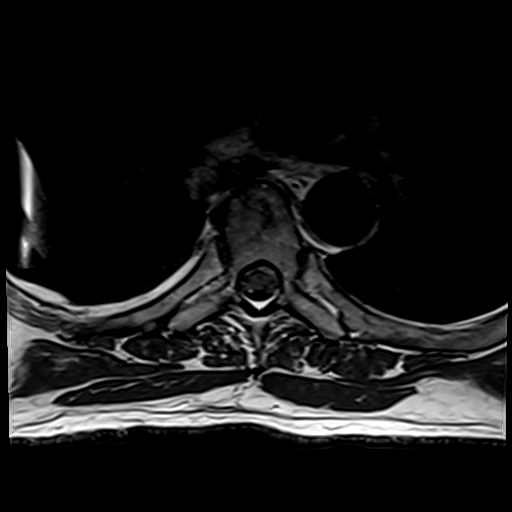
[im 28/39]
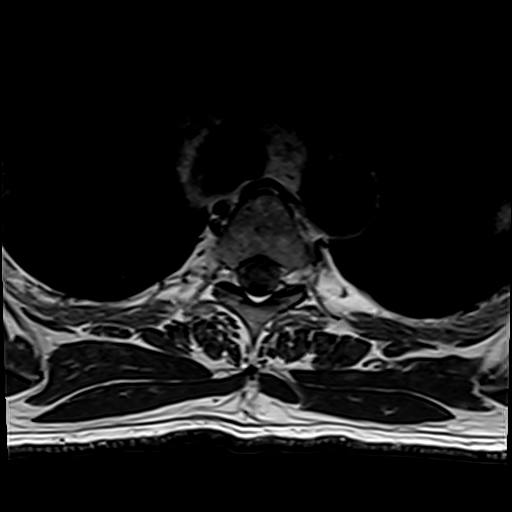
[im 33/39]
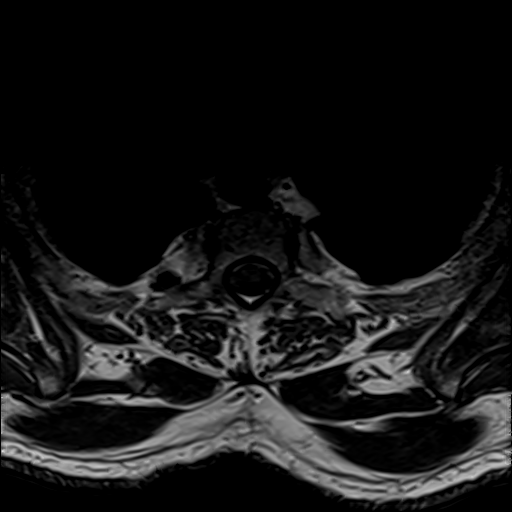

[Series 28: T1 fat-sat post-contrast · sagittal · 3.0mm · 1.33mm/px · 4 of 19 slices shown]
[im 1/19]
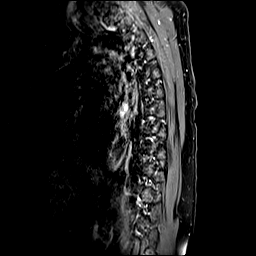
[im 7/19]
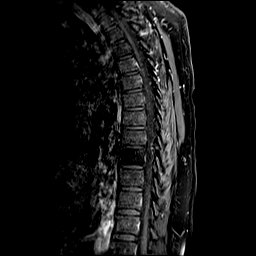
[im 13/19]
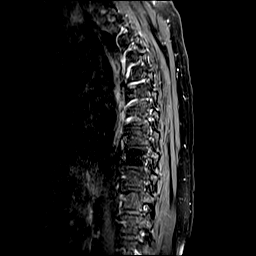
[im 19/19]
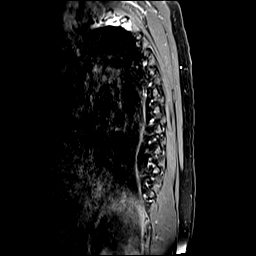

[31 of 48 positions shown; findings below may reference images not displayed]

FINDINGS: MRI CERVICAL SPINE FINDINGS

Alignment: Normal

Vertebrae: Normal marrow signal. No bone lesions or fractures. Large
bridging anterior osteophytes are noted at

Cord: Normal.  No cord lesions or cervical cord syrinx.

Posterior Fossa, vertebral arteries, paraspinal tissues: No
significant findings.

Disc levels:

C2-3: No significant findings.

C3-4: Diffuse bulging annulus and osteophytic ridging asymmetric
left with flattening of the ventral thecal sac and narrowing the
ventral CSF space more pronounced to the left side. There is also
moderate left foraminal stenosis likely effecting the left C4 nerve
root

C4-5: No significant findings.

C5-6: Mild right-sided disc osteophyte complex with mild right
foraminal stenosis. No spinal or left foraminal stenosis.

C6-7: No significant findings.

C7-T1: No significant findings.

MRI THORACIC SPINE FINDINGS WITHOUT AND WITH CONTRAST

Alignment:  Normal

Vertebrae: Normal marrow signal except for a large hemangioma
occupying the T9 vertebral body. No worrisome bone lesions or
abnormal enhancement.

Cord: Normal cord signal intensity. No cord lesion. There is a tiny
thoracic cord syrinx/slightly prominent central cord canal beginning
at T7-8 and extending down to 212.

Paraspinal and other soft tissues: No significant findings.

Disc levels:

Shallow left paracentral disc protrusion noted at T4-5.

Small right paracentral disc protrusion at T5-6.

Left-sided facet disease and ligamentum flavum thickening at T8-9
with mild impression on the posterolateral aspect of the thecal sac.

No significant canal or foraminal stenosis.
IMPRESSION: 1. Mild spinal and moderate left foraminal stenosis at C3-4.
2. Mild right-sided disc osteophyte complex at C5-6 with mild right
foraminal stenosis.
3. Shallow left paracentral disc protrusion at T4-5.
4. Small right paracentral disc protrusion at T5-6.
5. Tiny thoracic cord syrinx/slightly prominent central cord canal
beginning at T7-8 and extending down to T12. This is a benign
finding. No cord lesion or abnormal enhancement.

## 2020-06-03 IMAGING — MR MR LUMBAR SPINE W/O CM
5 series · 31 of 48 positions shown · non-contrast
Comparison: None.

CLINICAL DATA: Low back pain with progressive neurologic deficit.
Bilateral leg pain.

EXAM:
MRI LUMBAR SPINE WITHOUT CONTRAST
TECHNIQUE: Multiplanar, multisequence MR imaging of the lumbar spine was
performed. No intravenous contrast was administered.

[Series 5: T2 · sagittal · 4.0mm · 0.81mm/px · 6 of 17 slices shown (1 of 2)]
[im 1/17]
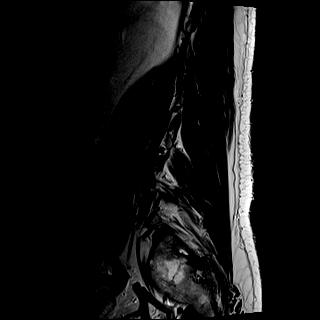
[im 4/17]
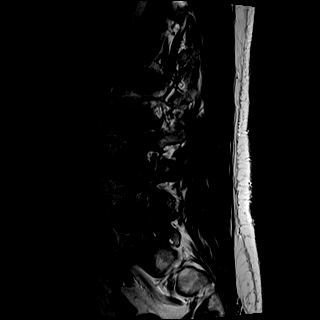
[im 7/17]
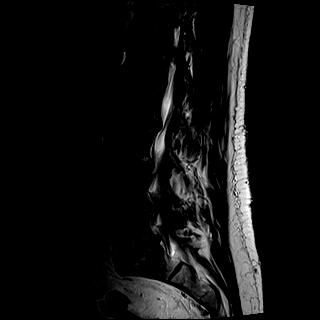
[im 10/17]
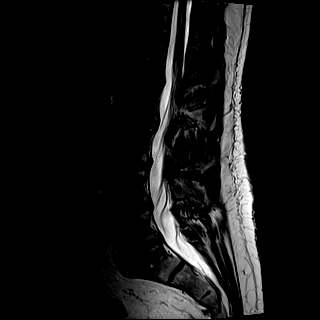
[im 13/17]
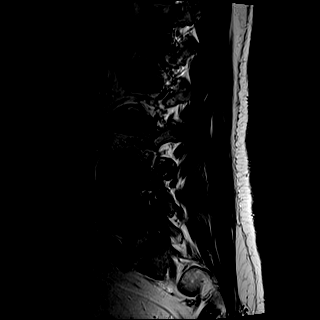
[im 17/17]
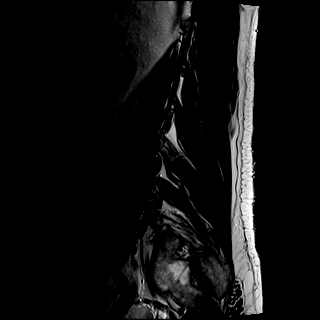

[Series 6: T1 · sagittal · 4.0mm · 0.81mm/px · 6 of 17 slices shown (1 of 2)]
[im 1/17]
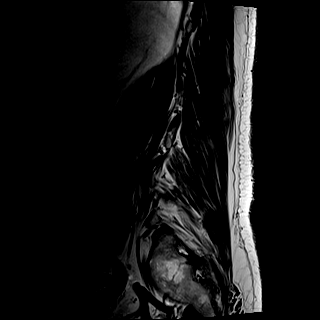
[im 4/17]
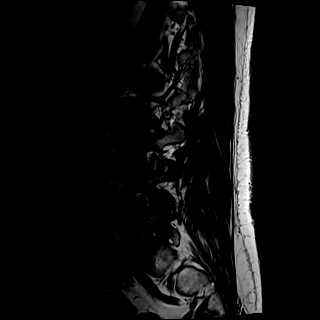
[im 7/17]
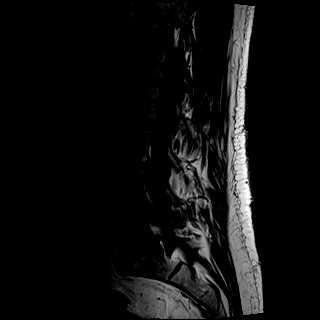
[im 10/17]
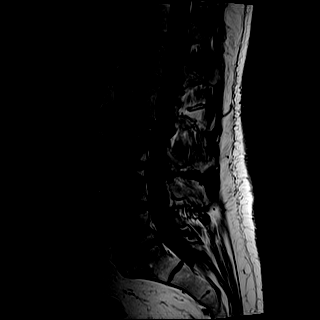
[im 13/17]
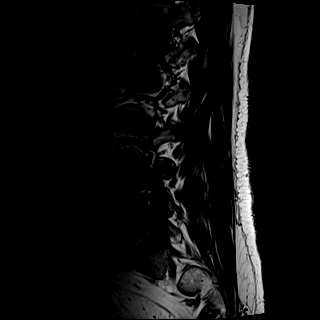
[im 17/17]
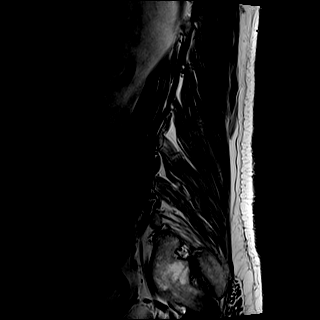

[Series 7: STIR · sagittal · 4.0mm · 0.41mm/px · 1 of 17 slices shown]
[im 1/17]
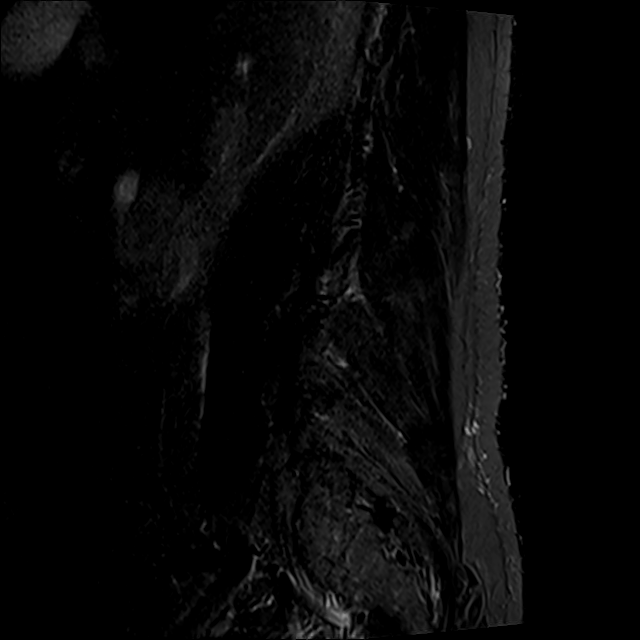

[Series 9: T2 · axial · 4.0mm · 0.78mm/px · z∈[-175,+50]mm · 9 of 39 slices shown (2 of 2)]
[im 1/39]
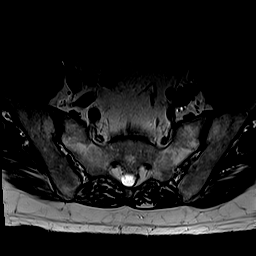
[im 6/39]
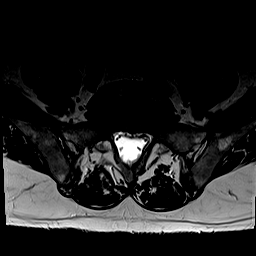
[im 11/39]
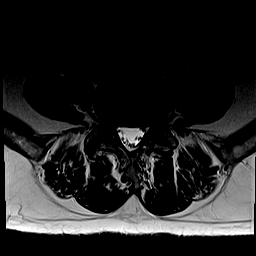
[im 17/39]
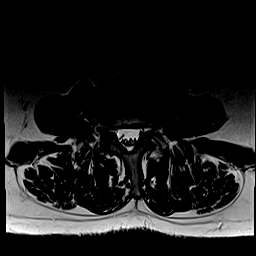
[im 20/39]
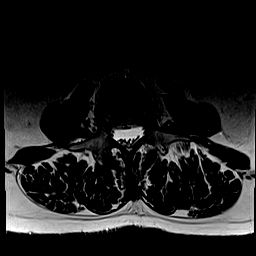
[im 22/39]
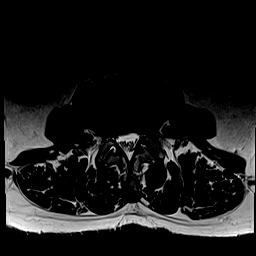
[im 28/39]
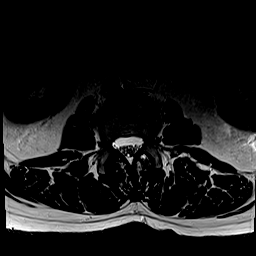
[im 33/39]
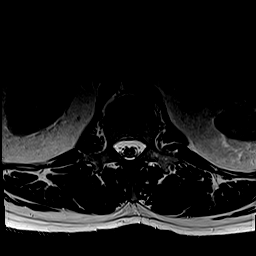
[im 39/39]
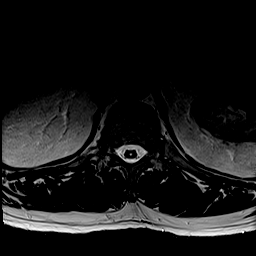

[Series 10: T1 · axial · 4.0mm · 0.39mm/px · z∈[-175,+50]mm · 9 of 39 slices shown (2 of 2)]
[im 1/39]
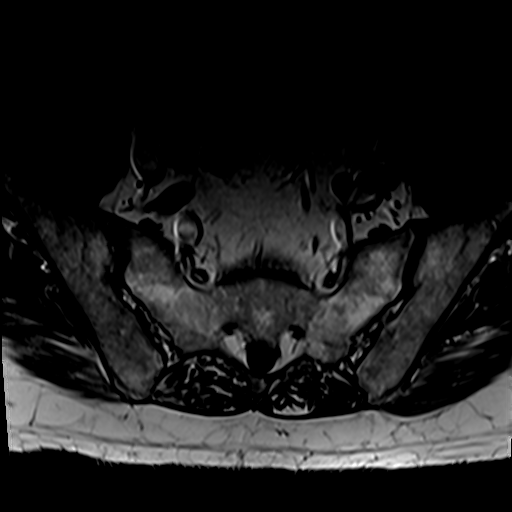
[im 6/39]
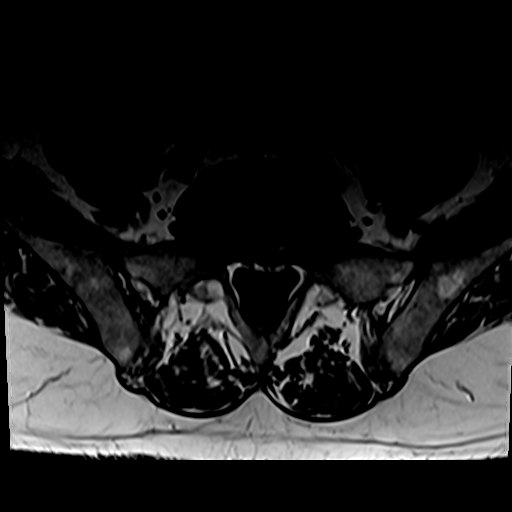
[im 11/39]
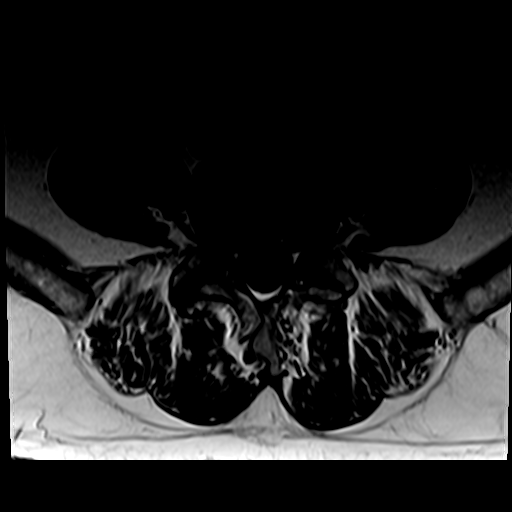
[im 17/39]
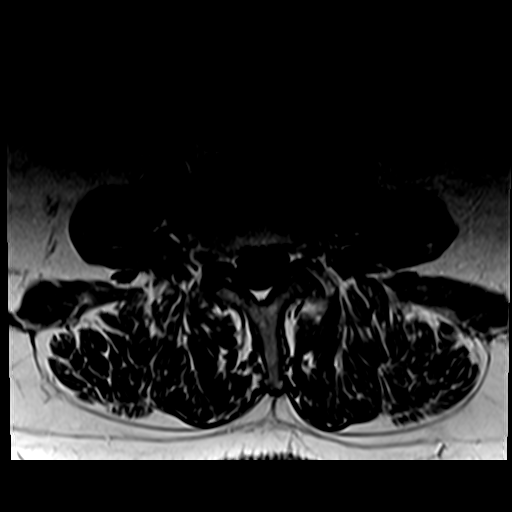
[im 20/39]
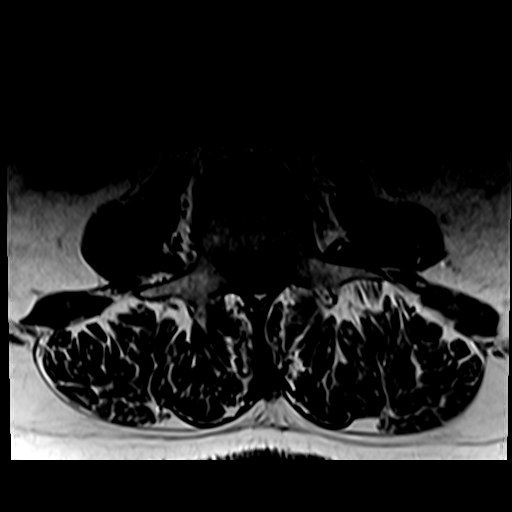
[im 22/39]
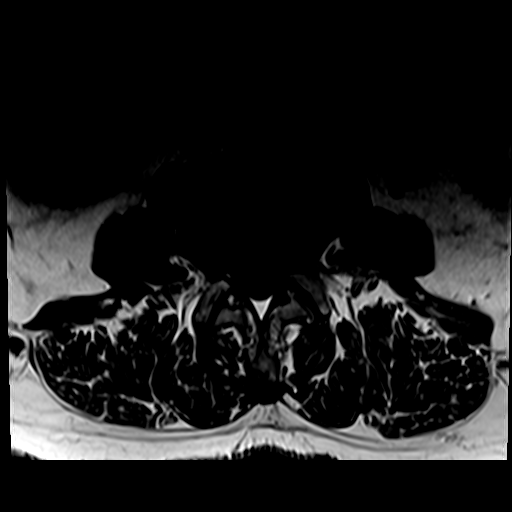
[im 28/39]
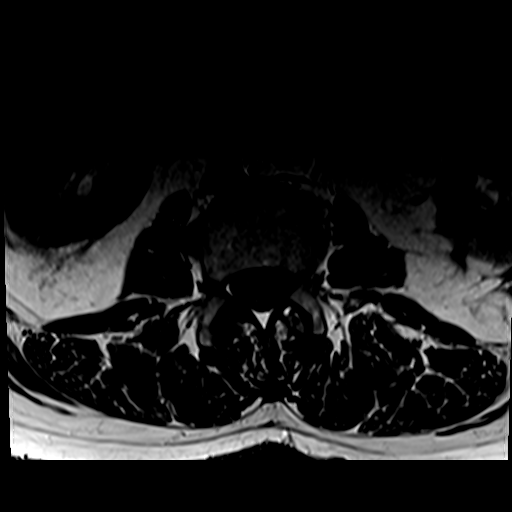
[im 33/39]
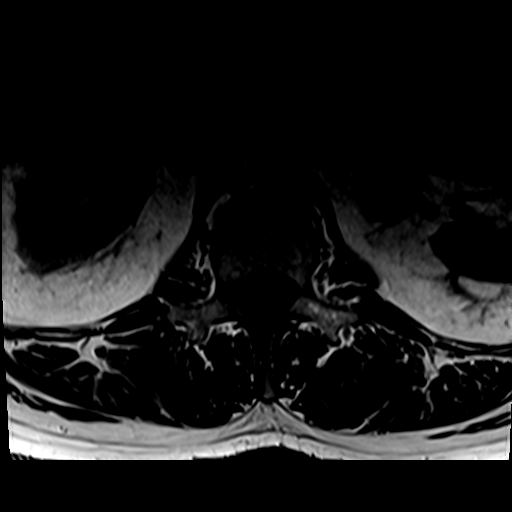
[im 39/39]
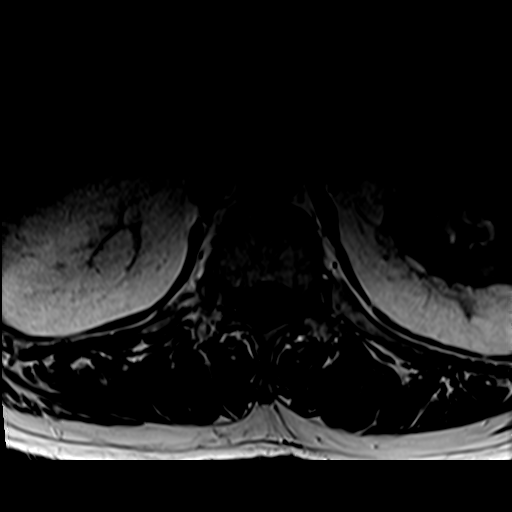

[31 of 48 positions shown; findings below may reference images not displayed]

FINDINGS: Segmentation:  Normal

Alignment:  Slight retrolisthesis L2-3 and L3-4 and L5-S1

Vertebrae:  Negative for fracture or mass.  Normal bone marrow.

Conus medullaris and cauda equina: Conus extends to the L2 level.
Conus and cauda equina appear normal. Small syrinx in the lower
thoracic cord approximately 1.5 mm in diameter.

Paraspinal and other soft tissues: Negative for paraspinous mass or
adenopathy

Disc levels:

L1-2: Mild disc and facet degeneration.  Negative for stenosis

L2-3: Diffuse bulging of the disc with associated endplate spurring.
Mild facet degeneration. Mild subarticular stenosis bilaterally

L3-4: Disc degeneration with diffuse disc bulging and endplate
spurring. Mild facet degeneration. Mild subarticular stenosis
bilaterally, right greater than left

L4-5: Disc degeneration with disc bulging and diffuse endplate
spurring. Shallow central disc protrusion. Mild facet degeneration.
Moderate subarticular and foraminal stenosis on the right. Mild
subarticular and foraminal stenosis on the left. Spinal canal normal
in size

L5-S1: Moderate disc degeneration with disc space narrowing and
diffuse endplate spurring. Moderate subarticular and foraminal
stenosis bilaterally due to spurring.
IMPRESSION: Multilevel disc and facet degeneration throughout the lumbar spine.
Multilevel subarticular and foraminal stenosis as above. No
significant central canal stenosis

Small syrinx in the distal thoracic cord, incompletely evaluated. No
cord lesion identified in the area scanned.

## 2020-06-03 IMAGING — MR MR CERVICAL SPINE W/O CM
5 series · 36 of 48 positions shown · IV contrast (gadavist)
Comparison: None.

CLINICAL DATA: Slightly dilated central cord canal noted on lumbar
spine MRI.

EXAM:
MRI CERVICAL WITHOUT CONTRAST AND MRI THORACIC SPINE WITHOUT AND
WITH CONTRAST.
CONTRAST:  10 CC GADAVIST
TECHNIQUE: Multiplanar and multiecho pulse sequences of the cervical spine, to
include the craniocervical junction and cervicothoracic junction,
and the thoracic spine, were obtained. The cervical spine was
performed without contrast and the thoracic spine was performed
without and with contrast.

[Series 23: T2 · sagittal · 3.0mm · 0.62mm/px · 6 of 15 slices shown (1 of 2)]
[im 1/15]
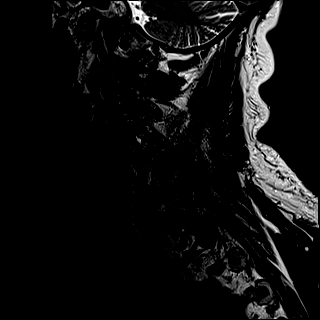
[im 3/15]
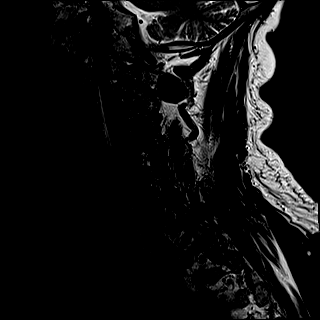
[im 6/15]
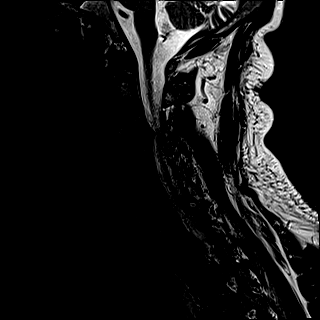
[im 9/15]
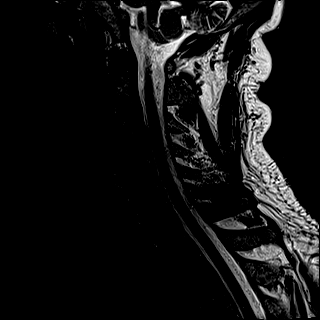
[im 12/15]
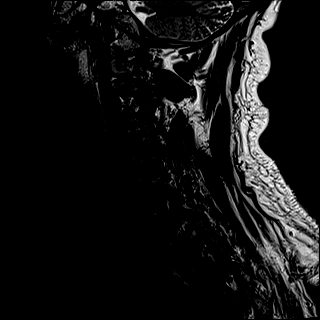
[im 15/15]
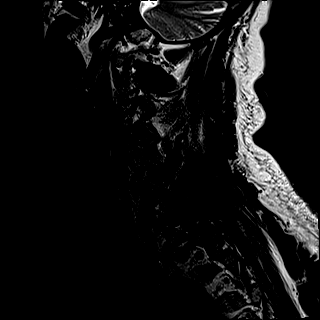

[Series 24: FLAIR · sagittal · 3.0mm · 0.78mm/px · 7 of 15 slices shown]
[im 1/15]
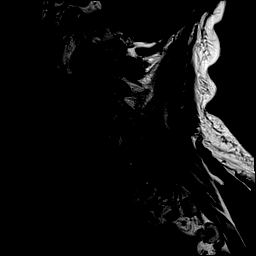
[im 3/15]
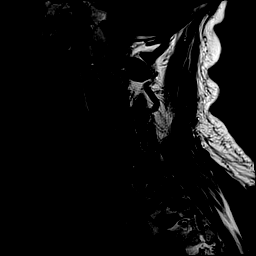
[im 5/15]
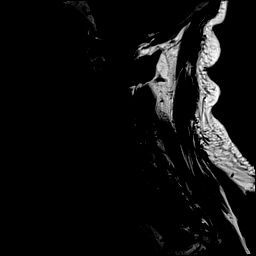
[im 8/15]
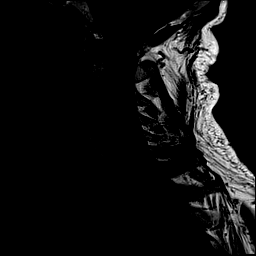
[im 10/15]
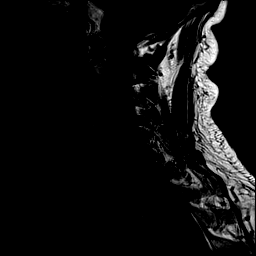
[im 12/15]
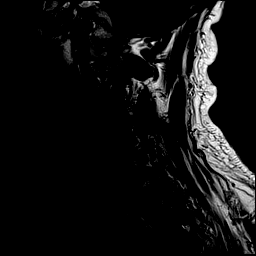
[im 15/15]
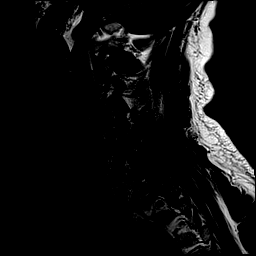

[Series 25: STIR · sagittal · 3.0mm · 0.62mm/px · 7 of 15 slices shown]
[im 1/15]
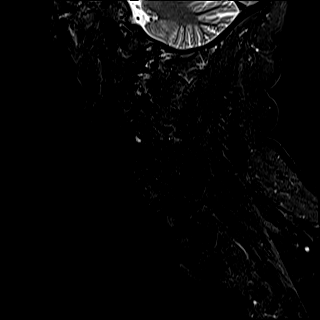
[im 3/15]
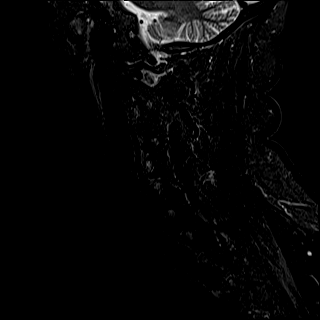
[im 5/15]
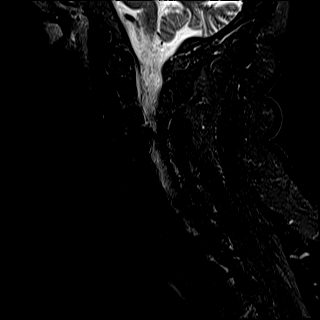
[im 8/15]
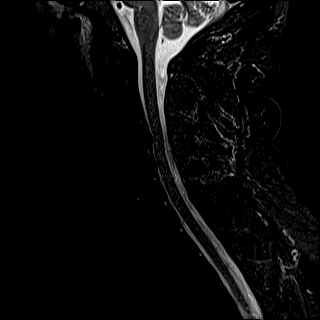
[im 10/15]
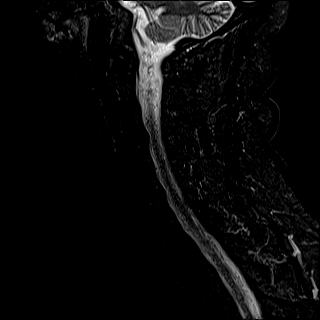
[im 12/15]
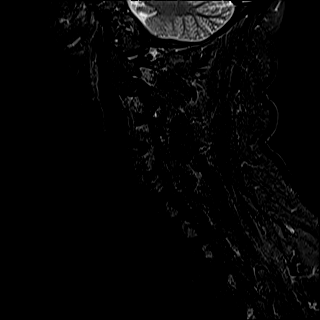
[im 15/15]
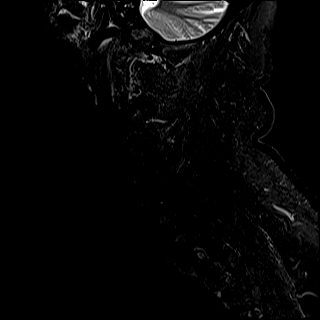

[Series 26: T2 · axial · 3.0mm · 0.70mm/px · z∈[-153,-53]mm · 8 of 31 slices shown (2 of 2)]
[im 1/31]
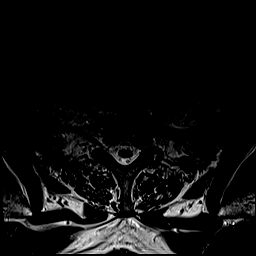
[im 5/31]
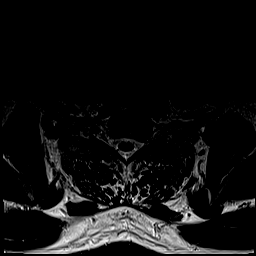
[im 10/31]
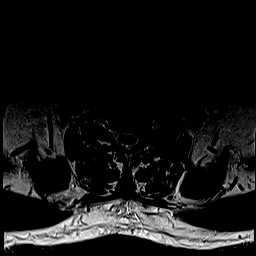
[im 14/31]
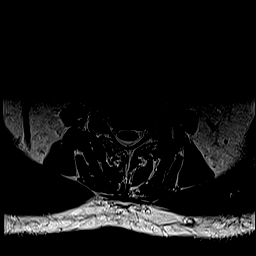
[im 17/31]
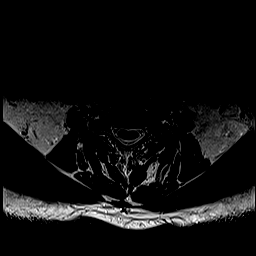
[im 21/31]
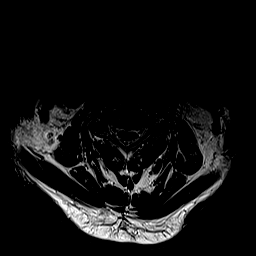
[im 26/31]
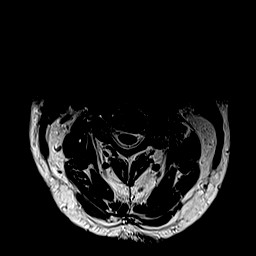
[im 31/31]
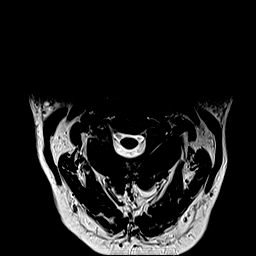

[Series 27: csp ax mpgr · axial · 3.0mm · 0.35mm/px · z∈[-153,-53]mm · 8 of 31 slices shown]
[im 1/31]
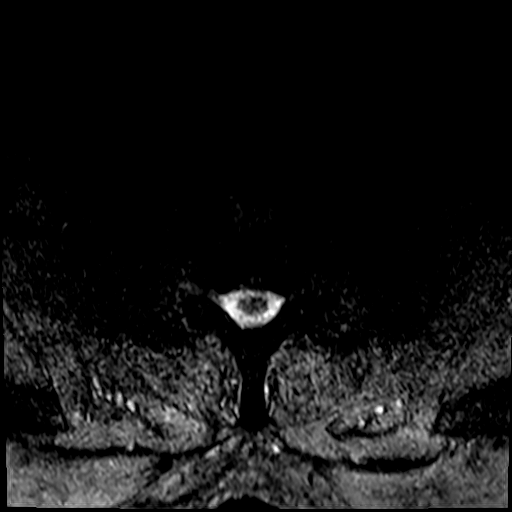
[im 5/31]
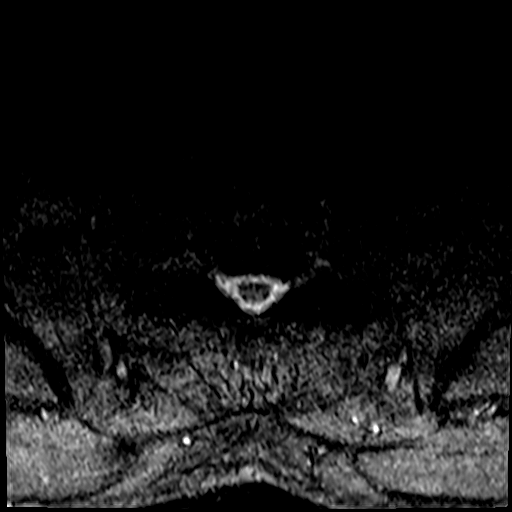
[im 10/31]
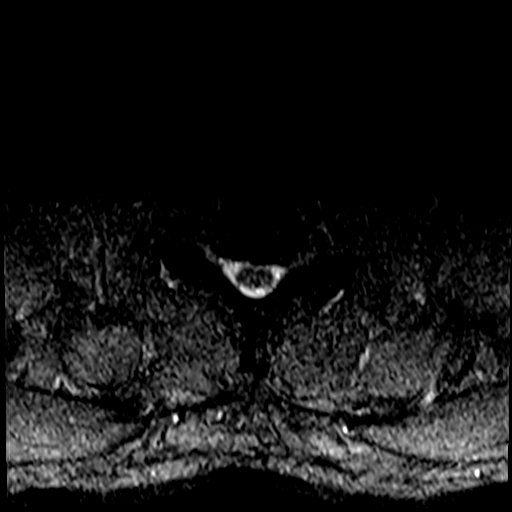
[im 14/31]
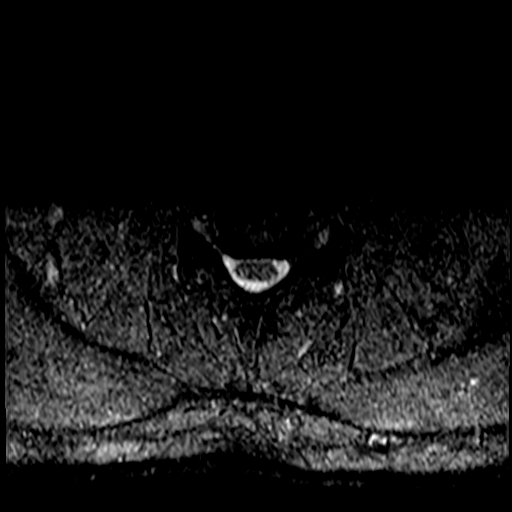
[im 17/31]
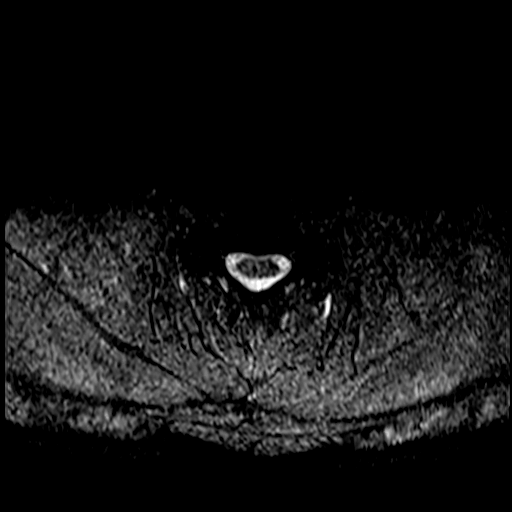
[im 21/31]
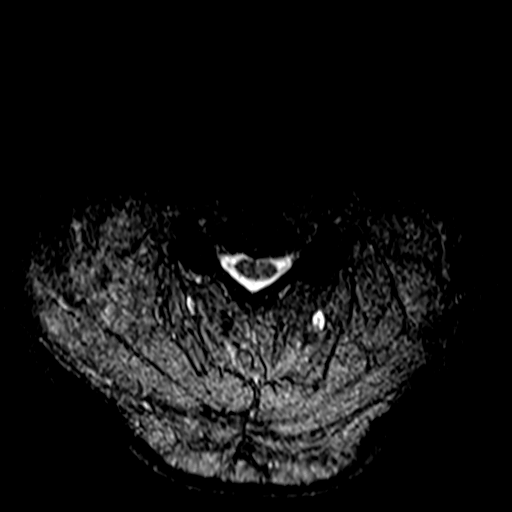
[im 26/31]
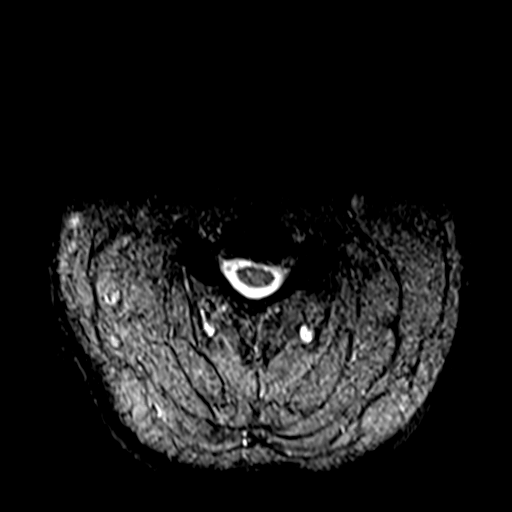
[im 31/31]
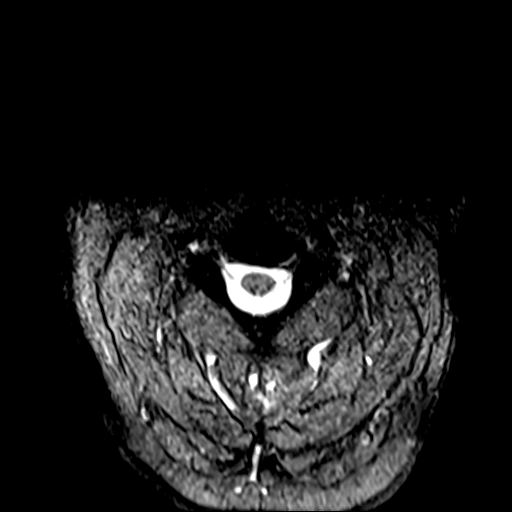

[36 of 48 positions shown; findings below may reference images not displayed]

FINDINGS: MRI CERVICAL SPINE FINDINGS

Alignment: Normal

Vertebrae: Normal marrow signal. No bone lesions or fractures. Large
bridging anterior osteophytes are noted at

Cord: Normal.  No cord lesions or cervical cord syrinx.

Posterior Fossa, vertebral arteries, paraspinal tissues: No
significant findings.

Disc levels:

C2-3: No significant findings.

C3-4: Diffuse bulging annulus and osteophytic ridging asymmetric
left with flattening of the ventral thecal sac and narrowing the
ventral CSF space more pronounced to the left side. There is also
moderate left foraminal stenosis likely effecting the left C4 nerve
root

C4-5: No significant findings.

C5-6: Mild right-sided disc osteophyte complex with mild right
foraminal stenosis. No spinal or left foraminal stenosis.

C6-7: No significant findings.

C7-T1: No significant findings.

MRI THORACIC SPINE FINDINGS WITHOUT AND WITH CONTRAST

Alignment:  Normal

Vertebrae: Normal marrow signal except for a large hemangioma
occupying the T9 vertebral body. No worrisome bone lesions or
abnormal enhancement.

Cord: Normal cord signal intensity. No cord lesion. There is a tiny
thoracic cord syrinx/slightly prominent central cord canal beginning
at T7-8 and extending down to 212.

Paraspinal and other soft tissues: No significant findings.

Disc levels:

Shallow left paracentral disc protrusion noted at T4-5.

Small right paracentral disc protrusion at T5-6.

Left-sided facet disease and ligamentum flavum thickening at T8-9
with mild impression on the posterolateral aspect of the thecal sac.

No significant canal or foraminal stenosis.
IMPRESSION: 1. Mild spinal and moderate left foraminal stenosis at C3-4.
2. Mild right-sided disc osteophyte complex at C5-6 with mild right
foraminal stenosis.
3. Shallow left paracentral disc protrusion at T4-5.
4. Small right paracentral disc protrusion at T5-6.
5. Tiny thoracic cord syrinx/slightly prominent central cord canal
beginning at T7-8 and extending down to T12. This is a benign
finding. No cord lesion or abnormal enhancement.

## 2020-06-03 IMAGING — CR DG HIP (WITH OR WITHOUT PELVIS) 2-3V*L*
3 series · 3 of 3 positions shown · non-contrast
Comparison: None.

CLINICAL DATA: Concern for infection. Low back pain, hip and leg
pain

EXAM:
DG HIP (WITH OR WITHOUT PELVIS) 2-3V LEFT

[pelvis ap]
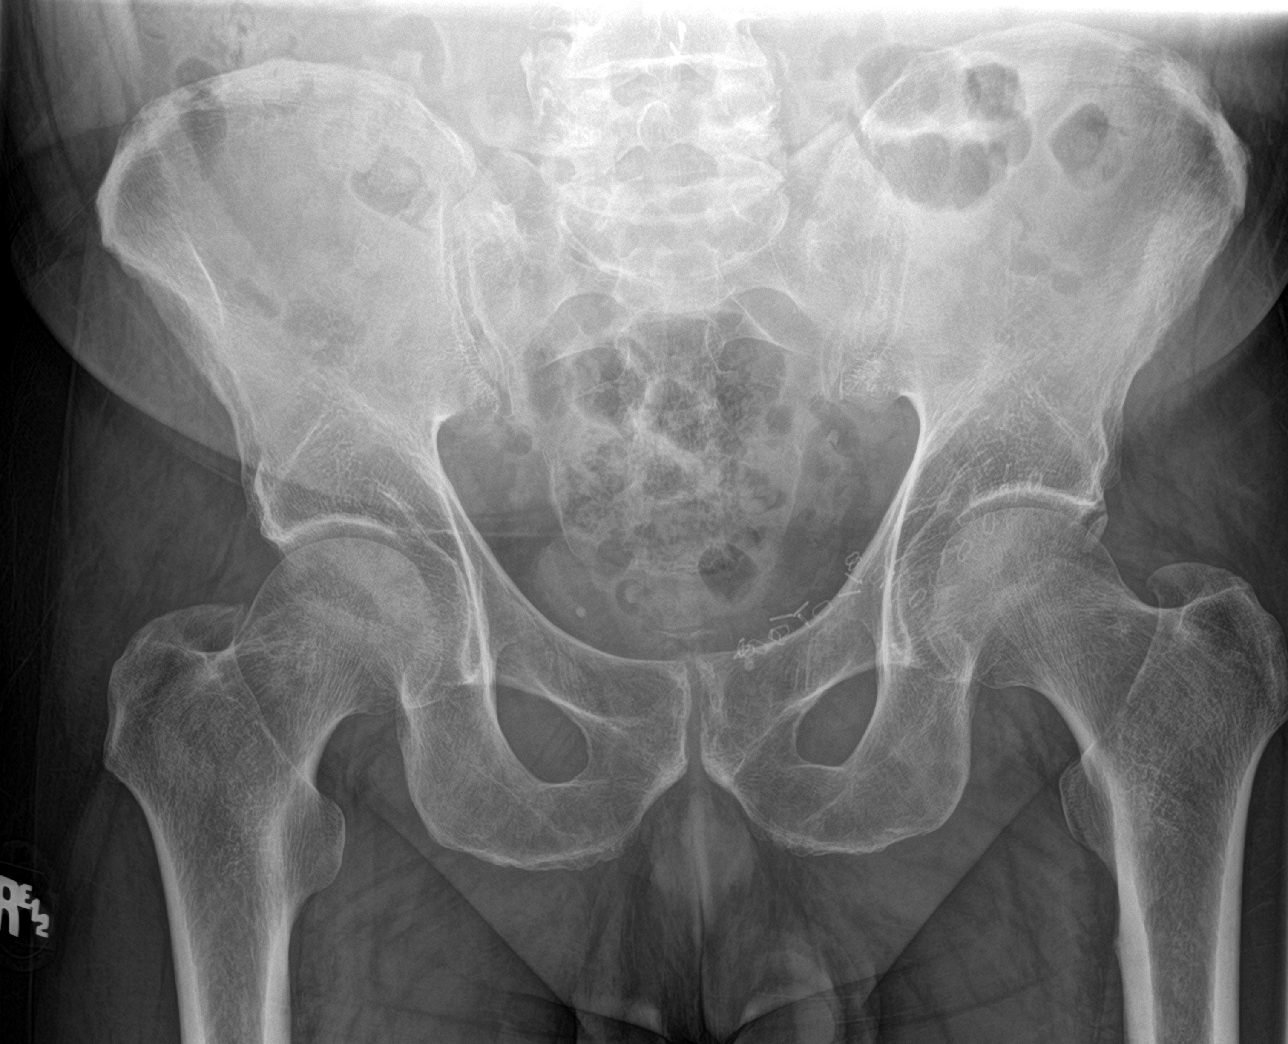

[hip ap]
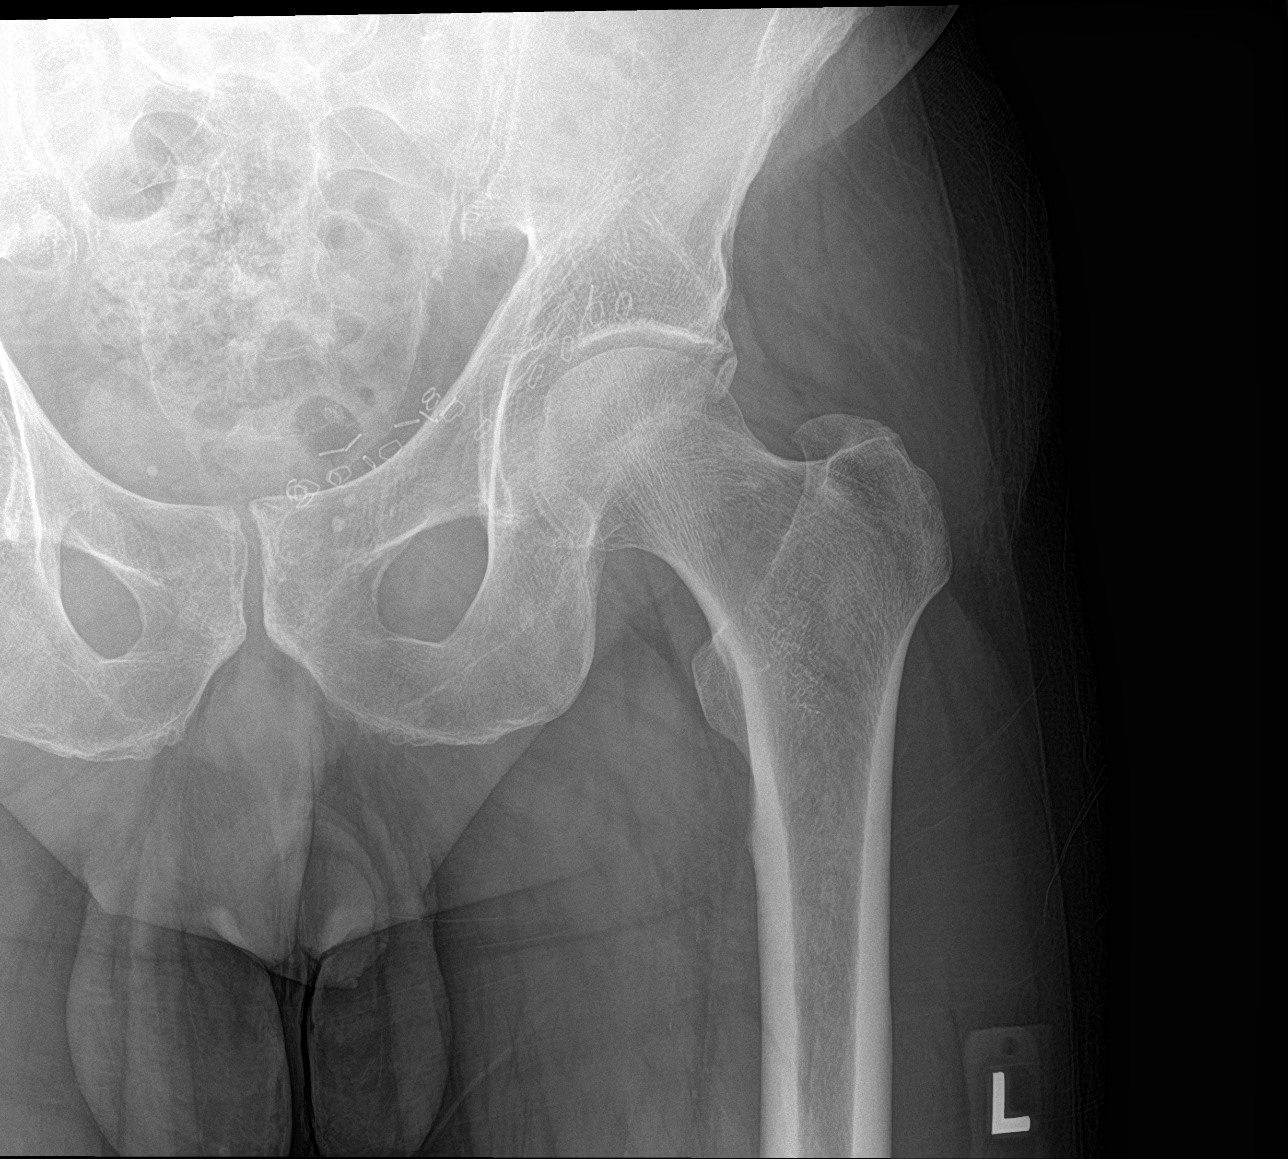

[hip lat]
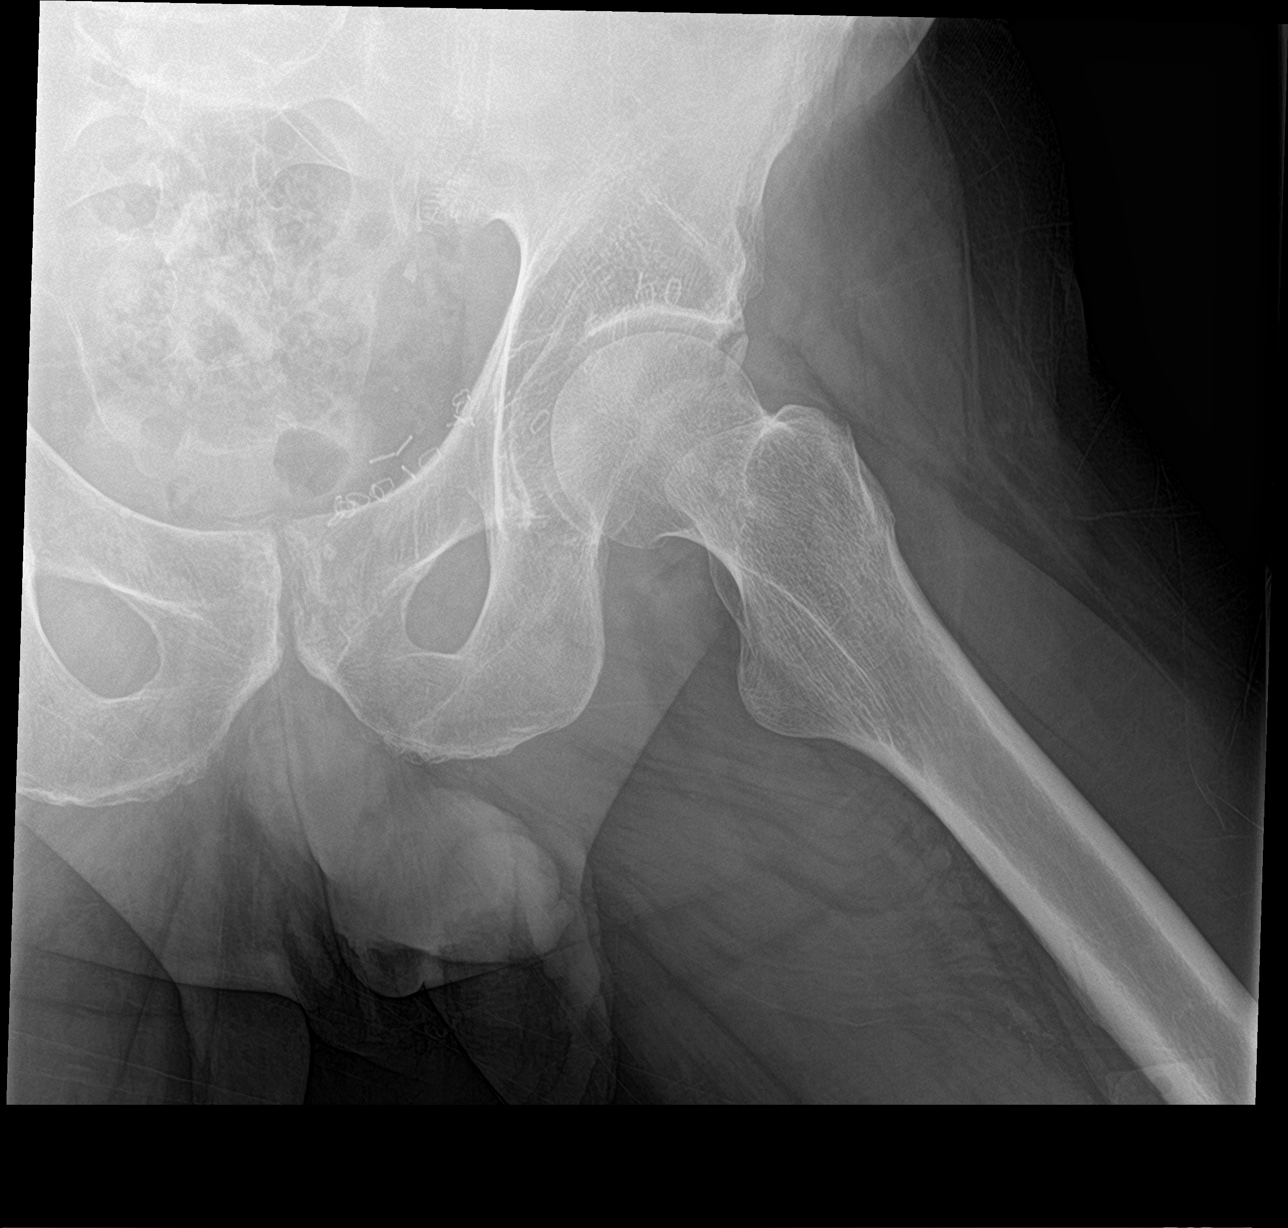

[3 of 3 positions shown; findings below may reference images not displayed]

FINDINGS: Surgical staples in the left inguinal region. No acute bony
abnormality. Specifically, no fracture, subluxation, or dislocation.
Hip joints and SI joints symmetric and unremarkable.
IMPRESSION: No acute bony abnormality.

## 2020-06-03 MED ORDER — HYDROCODONE-ACETAMINOPHEN 5-325 MG PO TABS: 1.0000 | ORAL_TABLET | Freq: Four times a day (QID) | ORAL | 0 refills | Status: DC | PRN

## 2020-06-03 MED ORDER — ONDANSETRON HCL 4 MG/2ML IJ SOLN
4.0000 mg | Freq: Once | INTRAMUSCULAR | Status: AC
  Administered 2020-06-03: 4 mg via INTRAVENOUS
  Filled 2020-06-03: qty 2

## 2020-06-03 MED ORDER — METHYLPREDNISOLONE 4 MG PO TBPK: ORAL_TABLET | ORAL | 0 refills | Status: DC

## 2020-06-03 MED ORDER — HYDROMORPHONE HCL 1 MG/ML IJ SOLN: 1.0000 mg | Freq: Once | INTRAMUSCULAR | Status: DC

## 2020-06-03 MED ORDER — HYDROMORPHONE HCL 1 MG/ML IJ SOLN
1.0000 mg | Freq: Once | INTRAMUSCULAR | Status: AC
Start: 2020-06-03 — End: 2020-06-03
  Administered 2020-06-03: 1 mg via INTRAVENOUS
  Filled 2020-06-03: qty 1

## 2020-06-03 MED ORDER — ONDANSETRON 4 MG PO TBDP: 4.0000 mg | ORAL_TABLET | Freq: Once | ORAL | Status: DC

## 2020-06-03 MED ORDER — GADOBUTROL 1 MMOL/ML IV SOLN
10.0000 mL | Freq: Once | INTRAVENOUS | Status: AC | PRN
  Administered 2020-06-03: 10 mL via INTRAVENOUS
  Filled 2020-06-03: qty 10

## 2020-06-03 MED ORDER — ORPHENADRINE CITRATE 30 MG/ML IJ SOLN
60.0000 mg | Freq: Once | INTRAMUSCULAR | Status: AC
  Administered 2020-06-03: 60 mg via INTRAVENOUS
  Filled 2020-06-03: qty 2

## 2020-06-03 MED ORDER — BACLOFEN 10 MG PO TABS: 10.0000 mg | ORAL_TABLET | Freq: Three times a day (TID) | ORAL | 0 refills | Status: AC

## 2020-06-03 MED ORDER — DEXAMETHASONE SODIUM PHOSPHATE 10 MG/ML IJ SOLN
10.0000 mg | Freq: Once | INTRAMUSCULAR | Status: AC
  Administered 2020-06-03: 10 mg via INTRAVENOUS
  Filled 2020-06-03: qty 1

## 2020-06-03 MED ORDER — HYDROMORPHONE HCL 1 MG/ML IJ SOLN
1.0000 mg | Freq: Once | INTRAMUSCULAR | Status: AC
  Administered 2020-06-03: 1 mg via INTRAVENOUS
  Filled 2020-06-03: qty 1

## 2020-06-03 NOTE — Consult Note (Signed)
Neurosurgery-New Consultation Evaluation 06/03/2020 Georgie BRAIDON CHERMAK [id number]  Identifying Statement: NATNAEL BIEDERMAN is a 60 y.o. male from [place] [id number] with leg pain  Physician Requesting Consultation: [facility]  History of Present Illness: Mr. Mckoy is here for evaluation of new onset pain in the groin and hip area, left greater than right.  He states this started yesterday.  Given the pain, he has had difficulty with hip flexion.  He does state the pain is pretty severe and thus sometimes radiate down to the feet.  He has had some difficulty picking his legs up to walk given this which prompted his visit to the emergency department.  He denies any numbness.  He denies any focal weakness but is having difficulty with the legs given the pain.  He reports no difficulty with bowel or bladder function  He does have a history of sepsis treated last fall.  He additionally had a Covid infection.  He does not report any other recent traumatic events.  Past Medical History:  Past Medical History:  Diagnosis Date  . Acute maxillary sinusitis   . Acute upper respiratory infections of unspecified site   . COVID-19 virus infection 10/2019  . Edema   . Esophageal reflux   . Essential hypertension, benign   . External hemorrhoids without mention of complication   . Other and unspecified hyperlipidemia   . Other malaise and fatigue   . Spontaneous pneumothorax    bilateral s/p surgery/pleurodesis [facility]   . Treadmill stress test negative for angina pectoris 2006   [facility]    Social History: Social History   Socioeconomic History  . Marital status: Married    Spouse name: Not on file  . Number of children: Not on file  . Years of education: Not on file  . Highest education level: Not on file  Occupational History  . Not on file  Tobacco Use  . Smoking status: Never Smoker  . Smokeless tobacco: Never Used  Vaping Use  . Vaping Use: Never used   Substance and Sexual Activity  . Alcohol use: Yes    Alcohol/week: 42.0 standard drinks    Types: 42 Cans of beer per week    Comment: beer on weekends   . Drug use: No  . Sexual activity: Yes    Partners: Female    Comment: married  Other Topics Concern  . Not on file  Social History Narrative  . Not on file   Social Determinants of Health   Financial Resource Strain: Not on file  Food Insecurity: Not on file  Transportation Needs: Not on file  Physical Activity: Not on file  Stress: Not on file  Social Connections: Not on file  Intimate Partner Violence: Not on file   Family History: Family History  Problem Relation Age of Onset  . Heart disease Father 47       Massive MI   . COPD Mother 43  . Heart disease Paternal Grandfather   . Colon cancer Neg Hx     Review of Systems:  Review of Systems - General ROS: Negative Psychological ROS: Negative Ophthalmic ROS: Negative ENT ROS: Negative Hematological and Lymphatic ROS: Negative  Endocrine ROS: Negative Respiratory ROS: Negative Cardiovascular ROS: Negative Gastrointestinal ROS: Negative Genito-Urinary ROS: Negative Musculoskeletal ROS: Negative for back pain Neurological ROS: Positive for bilateral hip and groin pain, negative for numbness Dermatological ROS: Negative  Physical Exam: BP 122/70 (BP Location: Right Arm)   Pulse 70  Temp 98.3 F (36.8 C) (Oral)   Resp 16   Ht 5\' 10"  (1.778 m)   Wt 100.7 kg   SpO2 99%   BMI 31.85 kg/m  Body mass index is 31.85 kg/m. Body surface area is 2.23 meters squared. General appearance: Alert, cooperative, in no acute distress Head: Normocephalic, atraumatic Eyes: Normal, EOM intact Oropharynx: Wearing facemask Ext: No edema in LE bilaterally, warm extremities  Neurologic exam:  Mental status: alertness: alert,  affect: normal Speech: fluent and clear Motor:strength symmetric 5/5 in bilateral knee extension, knee flexion, dorsiflexion, plantarflexion.  On  the right he is 5 out of 5 in hip flexion, on the left he is 3-5 in hip flexion which is pain limited Sensory: intact to light touch in bilateral lower extremities Reflexes: 2+ and symmetric bilaterally for patella, negative clonus at the ankle Gait: normal   Laboratory: Results for orders placed or performed during the hospital encounter of 06/03/20  Comprehensive metabolic panel  Result Value Ref Range   Sodium 135 135 - 145 mmol/L   Potassium 4.2 3.5 - 5.1 mmol/L   Chloride 99 98 - 111 mmol/L   CO2 23 22 - 32 mmol/L   Glucose, Bld 114 (H) 70 - 99 mg/dL   BUN 15 8 - 23 mg/dL   Creatinine, Ser 0.82 0.61 - 1.24 mg/dL   Calcium 9.0 8.9 - 10.3 mg/dL   Total Protein 7.3 6.5 - 8.1 g/dL   Albumin 4.2 3.5 - 5.0 g/dL   AST 37 15 - 41 U/L   ALT 38 0 - 44 U/L   Alkaline Phosphatase 72 38 - 126 U/L   Total Bilirubin 1.6 (H) 0.3 - 1.2 mg/dL   GFR, Estimated >60 >60 mL/min   Anion gap 13 5 - 15  CBC with Differential  Result Value Ref Range   WBC 8.6 4.0 - 10.5 K/uL   RBC 4.72 4.22 - 5.81 MIL/uL   Hemoglobin 15.2 13.0 - 17.0 g/dL   HCT 44.0 39.0 - 52.0 %   MCV 93.2 80.0 - 100.0 fL   MCH 32.2 26.0 - 34.0 pg   MCHC 34.5 30.0 - 36.0 g/dL   RDW 13.1 11.5 - 15.5 %   Platelets 217 150 - 400 K/uL   nRBC 0.0 0.0 - 0.2 %   Neutrophils Relative % 83 %   Neutro Abs 7.1 1.7 - 7.7 K/uL   Lymphocytes Relative 11 %   Lymphs Abs 0.9 0.7 - 4.0 K/uL   Monocytes Relative 4 %   Monocytes Absolute 0.3 0.1 - 1.0 K/uL   Eosinophils Relative 0 %   Eosinophils Absolute 0.0 0.0 - 0.5 K/uL   Basophils Relative 1 %   Basophils Absolute 0.1 0.0 - 0.1 K/uL   Immature Granulocytes 1 %   Abs Immature Granulocytes 0.11 (H) 0.00 - 0.07 K/uL   I personally reviewed labs  Imaging: MRI lumbar spine: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 No significant central canal stenosis. Small syrinx in the distal  thoracic cord, incompletely evaluated. No cord lesion identified in the area scanned.   Impression/Plan:  Mr. Peschke is here for evaluation of symptoms in the hips which consist of pain without obvious numbness or bowel or bladder difficulty.  His MRI of the lumbar spine does not reveal any significant concerning stenosis but it does show a small syrinx in the lower portion of the spinal cord.  Given this, I would  recommend imaging of the thoracic and cervical spine for complete evaluation.  However, if the syrinx is nonsignificant in these areas, I could not explain his symptoms from that.  Given his recent history of infection, it may be worthwhile to evaluate his hips for any pathology also.  His exam is also reassuring without any significant hyperreflexia, sensation changes, or weakness.   1.  Diagnosis: Hip and thigh pain  2.  Plan -MRI of the cervical thoracic spine has been ordered I can follow-up on the results once completed

## 2020-06-03 NOTE — Discharge Instructions (Signed)
You have a finding called a syrinx, the second mri showed this as being a benign finding Follow up with orthopedics if you have continued hip pain Dr Lacinda Axon for back pain or difficulty walking Take the medications as prescribed, Do not take nsaids like ibuprofen while taking the steroid

## 2020-06-03 NOTE — ED Notes (Signed)
Dr Lacinda Axon in with pt
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
## 2020-06-03 NOTE — ED Notes (Signed)
See triage note  Presents with pain to lower back into bilateral hip areas into both legs  Denies any recent trauma  States he noticed increased when trying to get up to go to the bathroom

## 2020-06-03 NOTE — ED Triage Notes (Signed)
Pt c/o lower back , hip and BL leg pain since yesterday morning, states he has a hx of back pain with sciatica, states he has had a catch before but normally it works itself out but this does not seem to be going away.

## 2020-06-03 NOTE — ED Notes (Signed)
Attempted to ambulate Was able to stand but not able to lift his legs  Provider at bedside

## 2020-06-03 NOTE — ED Notes (Signed)
addiitonal pain meds given  Family at bedside

## 2020-06-10 ENCOUNTER — Other Ambulatory Visit: Payer: Self-pay | Admitting: Internal Medicine

## 2020-06-20 ENCOUNTER — Other Ambulatory Visit: Payer: Self-pay | Admitting: Internal Medicine

## 2020-07-25 ENCOUNTER — Other Ambulatory Visit: Payer: Self-pay | Admitting: Internal Medicine

## 2020-08-28 ENCOUNTER — Other Ambulatory Visit: Payer: Self-pay | Admitting: Internal Medicine

## 2020-09-25 ENCOUNTER — Telehealth: Payer: Self-pay

## 2020-09-25 NOTE — Telephone Encounter (Signed)
Pt called requesting an appt with Dr. Derrel Nip asap. Pt stated that he was seen at [facility] today and was advised by the doctor there that he be seen by his PCP asap. Pt stated that he is having trouble sleeping at night due to "things going through my head". I asked pt if he had any thoughts of hurting himself and he stated he did not. Pt is having issues with anxiety and depression. I have scheduled pt in first available on 10/01/2020 at 8am.

## 2020-10-01 ENCOUNTER — Ambulatory Visit: Payer: 59 | Admitting: Internal Medicine

## 2020-10-01 ENCOUNTER — Encounter: Payer: Self-pay | Admitting: Internal Medicine

## 2020-10-01 ENCOUNTER — Other Ambulatory Visit: Payer: Self-pay

## 2020-10-01 ENCOUNTER — Telehealth: Payer: Self-pay | Admitting: Internal Medicine

## 2020-10-01 VITALS — BP 140/80 | HR 96 | Temp 98.1°F | Ht 70.0 in | Wt 212.0 lb

## 2020-10-01 DIAGNOSIS — R197 Diarrhea, unspecified: Secondary | ICD-10-CM | POA: Diagnosis not present

## 2020-10-01 DIAGNOSIS — K521 Toxic gastroenteritis and colitis: Secondary | ICD-10-CM | POA: Diagnosis not present

## 2020-10-01 DIAGNOSIS — T3695XA Adverse effect of unspecified systemic antibiotic, initial encounter: Secondary | ICD-10-CM

## 2020-10-01 DIAGNOSIS — F1010 Alcohol abuse, uncomplicated: Secondary | ICD-10-CM | POA: Diagnosis not present

## 2020-10-01 DIAGNOSIS — F10982 Alcohol use, unspecified with alcohol-induced sleep disorder: Secondary | ICD-10-CM

## 2020-10-01 LAB — CBC WITH DIFFERENTIAL/PLATELET
Basophils Absolute: 0 10*3/uL (ref 0.0–0.1)
Basophils Relative: 0.4 % (ref 0.0–3.0)
Eosinophils Absolute: 0 10*3/uL (ref 0.0–0.7)
Eosinophils Relative: 0.5 % (ref 0.0–5.0)
HCT: 49 % (ref 39.0–52.0)
Hemoglobin: 16.6 g/dL (ref 13.0–17.0)
Lymphocytes Relative: 17.2 % (ref 12.0–46.0)
Lymphs Abs: 1 10*3/uL (ref 0.7–4.0)
MCHC: 34 g/dL (ref 30.0–36.0)
MCV: 95.4 fl (ref 78.0–100.0)
Monocytes Absolute: 0.6 10*3/uL (ref 0.1–1.0)
Monocytes Relative: 11.1 % (ref 3.0–12.0)
Neutro Abs: 3.9 10*3/uL (ref 1.4–7.7)
Neutrophils Relative %: 70.8 % (ref 43.0–77.0)
Platelets: 195 10*3/uL (ref 150.0–400.0)
RBC: 5.13 Mil/uL (ref 4.22–5.81)
RDW: 13.7 % (ref 11.5–15.5)
WBC: 5.6 10*3/uL (ref 4.0–10.5)

## 2020-10-01 LAB — COMPREHENSIVE METABOLIC PANEL
ALT: 85 U/L — ABNORMAL HIGH (ref 0–53)
AST: 117 U/L — ABNORMAL HIGH (ref 0–37)
Albumin: 4.9 g/dL (ref 3.5–5.2)
Alkaline Phosphatase: 86 U/L (ref 39–117)
BUN: 10 mg/dL (ref 6–23)
CO2: 27 mEq/L (ref 19–32)
Calcium: 10.1 mg/dL (ref 8.4–10.5)
Chloride: 97 mEq/L (ref 96–112)
Creatinine, Ser: 0.9 mg/dL (ref 0.40–1.50)
GFR: 91.9 mL/min (ref >60.00)
Glucose, Bld: 103 mg/dL — ABNORMAL HIGH (ref 70–99)
Potassium: 4.1 mEq/L (ref 3.5–5.1)
Sodium: 137 mEq/L (ref 135–145)
Total Bilirubin: 2.6 mg/dL — ABNORMAL HIGH (ref 0.2–1.2)
Total Protein: 7.9 g/dL (ref 6.0–8.3)

## 2020-10-01 LAB — SEDIMENTATION RATE: Sed Rate: 9 mm/hr (ref 0–20)

## 2020-10-01 MED ORDER — ALPRAZOLAM 0.5 MG PO TABS: 0.5000 mg | ORAL_TABLET | Freq: Every evening | ORAL | 0 refills | Status: DC | PRN

## 2020-10-01 MED ORDER — TRAZODONE HCL 50 MG PO TABS: 50.0000 mg | ORAL_TABLET | Freq: Every evening | ORAL | 3 refills | Status: DC | PRN

## 2020-10-01 NOTE — Telephone Encounter (Signed)
Unable to leave message for patient to return call back. VM box has not been set up yet.

## 2020-10-01 NOTE — Assessment & Plan Note (Addendum)
With cold sweats,  4-5 liquid stools daily since Sunday.   Pharmacy confirms that  He was prescribed doxycycline by Urgent Care.  Patient advised to stay home for the next 24 hours minimum u ntil c dificile can be ruled out

## 2020-10-01 NOTE — Patient Instructions (Addendum)
You may have an antibiotic associated diarrhea that is contagious;  it is called Clostridum dificlie .  It's very important to bring back a stool sample ASAP so we can determine if this is the cause of your diarrhea   I am starting you on trazodone for your insomnia.  Take it at 9 pm  ,  you may increase the dose to 100 mg afer 3 days if it is not helping  The alprazolam can be used if you wake up EARLY  REturn in 2 weeksk to follow up on the depression

## 2020-10-01 NOTE — Progress Notes (Signed)
Subjective:  Patient ID: Jonathan Simon, male    DOB: November 23, 1960  Age: 60 y.o. MRN: [id number]  CC: The primary encounter diagnosis was Diarrhea of presumed infectious origin. 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  HPI Jonathan Simon presents fo  .This visit occurred during the SARS-CoV-2 public health emergency.  Safety protocols were in place, including screening questions prior to the visit, additional usage of staff PPE, and extensive cleaning of exam room while observing appropriate contact time as indicated for disinfecting solutions.   Covid negative on July 28  symptoms of viral infection  started 3 days prior . With chest congestion,  cough,   Tested at urgent Care.  Treated for   5 days with"something"   (not sure what the medication was,  thinks it was an antibiotic) included cold sweats,  anorexia,  sinus stuffiness,  rhinitis which is clear to yellow and PND which is causing a cough .   Recent travel   Has not checked temperature since onset of illness.  No prior COVID vaccination. He reports that he gets  short of breath with cough  but not with activity. Cough is  Not worse at night . Feels like he may be wheezing at night .  Did not go to work on Wednesday.  Has had diarrhea since Sunday /Monday,  having 4- 5 liquid stools daily,  since then.  no solid stools   Depression: going on for a while.  Insomnia ("mind just keeps going") . Got divorced a year ago,  and  additional family matters  are making him feel  overwhelmed.  Was given lorazepam 0.5 mg by urgent  [facility] physician , which he took twice daily  then once daily at 9 pm but it has not helped him sleep  Outpatient Medications Prior to Visit  Medication Sig Dispense Refill   albuterol (PROVENTIL HFA;VENTOLIN HFA) 108 (90 BASE) MCG/ACT inhaler Inhale 2 puffs into the lungs every 6 (six) hours as needed for wheezing. 1 Inhaler 0    amLODipine (NORVASC) 10 MG tablet TAKE 1 TABLET BY MOUTH DAILY 30 tablet 1   benzonatate (TESSALON) 200 MG capsule Take by mouth.     doxycycline (VIBRA-TABS) 100 MG tablet Take by mouth.     hydrochlorothiazide (HYDRODIURIL) 25 MG tablet Take 1 tablet (25 mg total) by mouth daily. 90 tablet 3   HYDROcodone-acetaminophen (NORCO/VICODIN) 5-325 MG tablet Take 1 tablet by mouth every 6 (six) hours as needed for moderate pain. 10 tablet 0   losartan (COZAAR) 100 MG tablet Take 1 tablet (100 mg total) by mouth daily. 90 tablet 3   methylPREDNISolone (MEDROL DOSEPAK) 4 MG TBPK tablet Take 6 pills on day one then decrease by 1 pill each day 21 tablet 0   metoprolol succinate (TOPROL-XL) 25 MG 24 hr tablet TAKE 1 TABLET BY MOUTH DAILY 30 tablet 5   Multiple Vitamin (MULTIVITAMIN) capsule Take 1 capsule by mouth daily.     niacin 500 MG tablet Take 500 mg by mouth daily with breakfast.       Omega-3 Fatty Acids (FISH OIL) 1200 MG CAPS Take 1,200 mg by mouth daily.     omeprazole (PRILOSEC) 20 MG capsule Take 20 mg by mouth daily.     simvastatin (ZOCOR) 20 MG tablet TAKE 1 TABLET BY MOUTH DAILY AT 6PM 30 tablet 1   LORazepam (ATIVAN) 0.5 MG tablet Take by mouth.  No facility-administered medications prior to visit.    Review of Systems;  Patient denies headache, fevers, malaise, unintentional weight loss, skin rash, eye pain, sinus congestion and sinus pain, sore throat, dysphagia,  hemoptysis ,  chest pain, palpitations, orthopnea, edema, abdominal pain, nausea, melena,  constipation, flank pain, dysuria, hematuria, urinary  Frequency, nocturia, numbness, tingling, seizures,  Focal weakness, Loss of consciousness,  Tremor, , and suicidal ideation.      Objective:  BP 140/80 (BP Location: Left Arm, Patient Position: Sitting)   Pulse 96   Temp 98.1 F (36.7 C)   Ht '5\' 10"'$  (1.778 m)   Wt 212 lb (96.2 kg)   SpO2 96%   BMI 30.42 kg/m   BP Readings from Last 3 Encounters:  10/01/20 140/80   06/03/20 (!) 138/93  01/04/20 (!) 145/97    Wt Readings from Last 3 Encounters:  10/01/20 212 lb (96.2 kg)  06/03/20 222 lb (100.7 kg)  01/02/20 200 lb (90.7 kg)    General appearance: alert, cooperative and appears stated age Ears: normal TM's and external ear canals both ears Throat: lips, mucosa, and tongue normal; teeth and gums normal Neck: no adenopathy, no carotid bruit, supple, symmetrical, trachea midline and thyroid not enlarged, symmetric, no tenderness/mass/nodules Back: symmetric, no curvature. ROM normal. No CVA tenderness. Lungs: clear to auscultation bilaterally Heart: regular rate and rhythm, S1, S2 normal, no murmur, click, rub or gallop Abdomen: soft, non-tender; bowel sounds normal; no masses,  no organomegaly Pulses: 2+ and symmetric Skin: Skin color, texture, turgor normal. No rashes or lesions Lymph nodes: Cervical, supraclavicular, and axillary nodes normal.  Lab Results  Component Value Date   HGBA1C 5.2 04/22/2016    Lab Results  Component Value Date   CREATININE 0.90 10/01/2020   CREATININE 0.82 06/03/2020   CREATININE 0.86 01/04/2020    Lab Results  Component Value Date   WBC 5.6 10/01/2020   HGB 16.6 10/01/2020   HCT 49.0 10/01/2020   PLT 195.0 10/01/2020   GLUCOSE 103 (H) 10/01/2020   CHOL 153 06/06/2019   TRIG 105.0 06/06/2019   HDL 45.50 06/06/2019   LDLDIRECT 88.0 05/02/2017   LDLCALC 87 06/06/2019   ALT 85 (H) 10/01/2020   AST 117 (H) 10/01/2020   NA 137 10/01/2020   K 4.1 10/01/2020   CL 97 10/01/2020   CREATININE 0.90 10/01/2020   BUN 10 10/01/2020   CO2 27 10/01/2020   TSH 1.03 09/24/2019   PSA 0.79 07/07/2018   INR 1.1 01/02/2020   HGBA1C 5.2 04/22/2016    MR Cervical Spine Wo Contrast  Result Date: 06/03/2020 CLINICAL DATA:  Slightly dilated central cord canal noted on lumbar spine MRI. EXAM: MRI CERVICAL WITHOUT CONTRAST AND MRI THORACIC SPINE WITHOUT AND WITH CONTRAST. CONTRAST:  10 CC GADAVIST TECHNIQUE:  Multiplanar and multiecho pulse sequences of the cervical spine, to include the craniocervical junction and cervicothoracic junction, and the thoracic spine, were obtained. The cervical spine was performed without contrast and the thoracic spine was performed without and with contrast. COMPARISON:  None. FINDINGS: MRI CERVICAL SPINE FINDINGS Alignment: Normal Vertebrae: Normal marrow signal. No bone lesions or fractures. Large bridging anterior osteophytes are noted at Cord: Normal.  No cord lesions or cervical cord syrinx. Posterior Fossa, vertebral arteries, paraspinal tissues: No significant findings. Disc levels: C2-3: No significant findings. C3-4: Diffuse bulging annulus and osteophytic ridging asymmetric left with flattening of the ventral thecal sac and narrowing the ventral CSF space more pronounced to the left side. There is  also moderate left foraminal stenosis likely effecting the left C4 nerve root C4-5: No significant findings. C5-6: Mild right-sided disc osteophyte complex with mild right foraminal stenosis. No spinal or left foraminal stenosis. C6-7: No significant findings. C7-T1: No significant findings. MRI THORACIC SPINE FINDINGS WITHOUT AND WITH CONTRAST Alignment:  Normal Vertebrae: Normal marrow signal except for a large hemangioma occupying the T9 vertebral body. No worrisome bone lesions or abnormal enhancement. Cord: Normal cord signal intensity. No cord lesion. There is a tiny thoracic cord syrinx/slightly prominent central cord canal beginning at T7-8 and extending down to 212. Paraspinal and other soft tissues: No significant findings. Disc levels: Shallow left paracentral disc protrusion noted at T4-5. Small right paracentral disc protrusion at T5-6. Left-sided facet disease and ligamentum flavum thickening at T8-9 with mild impression on the posterolateral aspect of the thecal sac. No significant canal or foraminal stenosis. IMPRESSION: 1. Mild spinal and moderate left foraminal  stenosis at C3-4. 2. Mild right-sided disc osteophyte complex at C5-6 with mild right foraminal stenosis. 3. Shallow left paracentral disc protrusion at T4-5. 4. Small right paracentral disc protrusion at T5-6. 5. Tiny thoracic cord syrinx/slightly prominent central cord canal beginning at T7-8 and extending down to T12. This is a benign finding. No cord lesion or abnormal enhancement. Electronically Signed   By: Marijo Sanes M.D.   On: 06/03/2020 18:02   MR LUMBAR SPINE WO CONTRAST  Result Date: 06/03/2020 CLINICAL DATA:  Low back pain with progressive neurologic deficit. Bilateral leg pain. EXAM: MRI LUMBAR SPINE WITHOUT CONTRAST TECHNIQUE: Multiplanar, multisequence MR imaging of the lumbar spine was performed. No intravenous contrast was administered. COMPARISON:  None. FINDINGS: Segmentation:  Normal Alignment:  Slight retrolisthesis L2-3 and L3-4 and L5-S1 Vertebrae:  Negative for fracture or mass.  Normal bone marrow. Conus medullaris and cauda equina: Conus extends to the L2 level. Conus and cauda equina appear normal. Small syrinx in the lower thoracic cord approximately 1.5 mm in diameter. Paraspinal and other soft tissues: Negative for paraspinous mass or adenopathy Disc levels: L1-2: Mild disc and facet degeneration.  Negative for stenosis L2-3: Diffuse bulging of the disc with associated endplate spurring. Mild facet degeneration. Mild subarticular stenosis bilaterally L3-4: Disc degeneration with diffuse disc bulging and endplate spurring. Mild facet degeneration. Mild subarticular stenosis bilaterally, right greater than left L4-5: Disc degeneration with disc bulging and diffuse endplate spurring. Shallow central disc protrusion. Mild facet degeneration. Moderate subarticular and foraminal stenosis on the right. Mild subarticular and foraminal stenosis on the left. Spinal canal normal in size L5-S1: Moderate disc degeneration with disc space narrowing and diffuse endplate spurring. Moderate  subarticular and foraminal stenosis bilaterally due to spurring. 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 Multilevel subarticular and foraminal stenosis as above. 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 No cord lesion identified in the area scanned. Electronically Signed   By: Franchot Gallo M.D.   On: 06/03/2020 14:10   MR THORACIC SPINE W WO CONTRAST  Result Date: 06/03/2020 CLINICAL DATA:  Slightly dilated central cord canal noted on lumbar spine MRI. EXAM: MRI CERVICAL WITHOUT CONTRAST AND MRI THORACIC SPINE WITHOUT AND WITH CONTRAST. CONTRAST:  10 CC GADAVIST TECHNIQUE: Multiplanar and multiecho pulse sequences of the cervical spine, to include the craniocervical junction and cervicothoracic junction, and the thoracic spine, were obtained. The cervical spine was performed without contrast and the thoracic spine was performed without and with contrast. COMPARISON:  None. FINDINGS: MRI CERVICAL  SPINE FINDINGS Alignment: Normal Vertebrae: Normal marrow signal. No bone lesions or fractures. Large bridging anterior osteophytes are noted at Cord: Normal.  No cord lesions or cervical cord syrinx. Posterior Fossa, vertebral arteries, paraspinal tissues: No significant findings. Disc levels: C2-3: No significant findings. C3-4: Diffuse bulging annulus and osteophytic ridging asymmetric left with flattening of the ventral thecal sac and narrowing the ventral CSF space more pronounced to the left side. There is also moderate left foraminal stenosis likely effecting the left C4 nerve root C4-5: No significant findings. C5-6: Mild right-sided disc osteophyte complex with mild right foraminal stenosis. No spinal or left foraminal stenosis. C6-7: No significant findings. C7-T1: No significant findings. MRI THORACIC SPINE FINDINGS WITHOUT AND WITH CONTRAST Alignment:  Normal Vertebrae: Normal marrow signal except for a  large hemangioma occupying the T9 vertebral body. No worrisome bone lesions or abnormal enhancement. Cord: Normal cord signal intensity. No cord lesion. There is a tiny thoracic cord syrinx/slightly prominent central cord canal beginning at T7-8 and extending down to 212. Paraspinal and other soft tissues: No significant findings. Disc levels: Shallow left paracentral disc protrusion noted at T4-5. Small right paracentral disc protrusion at T5-6. Left-sided facet disease and ligamentum flavum thickening at T8-9 with mild impression on the posterolateral aspect of the thecal sac. No significant canal or foraminal stenosis. IMPRESSION: 1. 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 2. Mild right-sided disc osteophyte complex at C5-6 with mild right foraminal stenosis. 3. Shallow left paracentral disc protrusion at T4-5. 4. Small right paracentral disc protrusion at T5-6. 5. Tiny thoracic cord syrinx/slightly prominent central cord canal beginning at T7-8 and extending down to T12. This is a benign finding. No cord lesion or abnormal enhancement. Electronically Signed   By: Marijo Sanes M.D.   On: 06/03/2020 18:02   DG Hip Unilat W or Wo Pelvis 2-3 Views Left  Result Date: 06/03/2020 CLINICAL DATA:  Concern for infection. Low back pain, hip and leg pain EXAM: DG HIP (WITH OR WITHOUT PELVIS) 2-3V LEFT COMPARISON:  None. FINDINGS: Surgical staples in the left inguinal region. No acute bony abnormality. Specifically, no fracture, subluxation, or dislocation. Hip joints and SI joints symmetric and unremarkable. IMPRESSION: No acute bony abnormality. Electronically Signed   By: Rolm Baptise M.D.   On: 06/03/2020 17:08    Assessment & Plan:   Problem List Items Addressed This Visit       Unprioritized   Alcohol abuse    Current liver enzyme elevation with AST>>ALT suggestive of recurrence.        Antibiotic-associated colitis    With cold sweats,  4-5 liquid stools daily since Sunday.    Pharmacy confirms that  He was prescribed doxycycline by Urgent Care.  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  Trazodone prescribed for nightly use,  Alprazolam for early waking.  Return in 2 weeks        Other Visit Diagnoses     Diarrhea of presumed infectious origin    -  Primary   Relevant Orders   CBC with Differential/Platelet (Completed)   Sedimentation rate (Completed)   Clostridium Difficile by PCR   Comprehensive metabolic panel (Completed)   GI Profile, Stool, PCR      Meds ordered this encounter  Medications  traZODone (DESYREL) 50 MG tablet    Sig: Take 1 tablet (50 mg total) by mouth at bedtime as needed for sleep. Increase to 100 mg if needed    Dispense:  90 tablet    Refill:  3   ALPRAZolam (XANAX) 0.5 MG tablet    Sig: Take 1 tablet (0.5 mg total) by mouth at bedtime as needed for anxiety.    Dispense:  20 tablet    Refill:  0    There are no discontinued medications.  Follow-up: Return in about 2 weeks (around 10/15/2020).   Crecencio Mc, MD

## 2020-10-01 NOTE — Telephone Encounter (Signed)
Patient was seen by Dr Derrel Nip today. Tullo wants to see him back in 2 weeks. No available appointments. Need a day to schedule appt.

## 2020-10-02 DIAGNOSIS — G4700 Insomnia, unspecified: Secondary | ICD-10-CM | POA: Insufficient documentation

## 2020-10-02 DIAGNOSIS — F10982 Alcohol use, unspecified with alcohol-induced sleep disorder: Secondary | ICD-10-CM | POA: Insufficient documentation

## 2020-10-02 NOTE — Telephone Encounter (Signed)
Appointment has been scheduled.

## 2020-10-02 NOTE — Assessment & Plan Note (Signed)
Current liver enzyme elevation with AST>>ALT suggestive of recurrence.

## 2020-10-02 NOTE — Assessment & Plan Note (Signed)
Suspected,  Given history of alcohol abuse,  Elevated AST>>ALT.  Trazodone prescribed for nightly use,  Alprazolam for early waking.  Return in 2 weeks

## 2020-10-03 LAB — CLOSTRIDIUM DIFFICILE BY PCR: Toxigenic C. Difficile by PCR: NEGATIVE

## 2020-10-04 LAB — GI PROFILE, STOOL, PCR

## 2020-10-23 ENCOUNTER — Ambulatory Visit: Payer: 59 | Admitting: Internal Medicine

## 2020-11-10 ENCOUNTER — Other Ambulatory Visit: Payer: Self-pay | Admitting: Internal Medicine

## 2020-11-24 ENCOUNTER — Ambulatory Visit: Payer: 59 | Admitting: Internal Medicine

## 2020-12-03 ENCOUNTER — Other Ambulatory Visit: Payer: Self-pay | Admitting: Internal Medicine

## 2020-12-17 ENCOUNTER — Ambulatory Visit: Payer: 59 | Admitting: Internal Medicine

## 2020-12-29 ENCOUNTER — Emergency Department
Admission: EM | Admit: 2020-12-29 | Discharge: 2020-12-30 | Disposition: A | Discharge status: Home / Self Care | Payer: 59 | Attending: Emergency Medicine | Admitting: Emergency Medicine

## 2020-12-29 ENCOUNTER — Other Ambulatory Visit: Payer: Self-pay

## 2020-12-29 DIAGNOSIS — E66811 Obesity, class 1: Secondary | ICD-10-CM | POA: Diagnosis present

## 2020-12-29 DIAGNOSIS — E860 Dehydration: Secondary | ICD-10-CM | POA: Diagnosis not present

## 2020-12-29 DIAGNOSIS — Z8616 Personal history of COVID-19: Secondary | ICD-10-CM | POA: Insufficient documentation

## 2020-12-29 DIAGNOSIS — E669 Obesity, unspecified: Secondary | ICD-10-CM | POA: Diagnosis present

## 2020-12-29 DIAGNOSIS — F1029 Alcohol dependence with unspecified alcohol-induced disorder: Secondary | ICD-10-CM | POA: Insufficient documentation

## 2020-12-29 DIAGNOSIS — G4700 Insomnia, unspecified: Secondary | ICD-10-CM | POA: Diagnosis present

## 2020-12-29 DIAGNOSIS — Z91040 Latex allergy status: Secondary | ICD-10-CM | POA: Insufficient documentation

## 2020-12-29 DIAGNOSIS — F1010 Alcohol abuse, uncomplicated: Secondary | ICD-10-CM | POA: Diagnosis not present

## 2020-12-29 DIAGNOSIS — E8729 Other acidosis: Secondary | ICD-10-CM | POA: Insufficient documentation

## 2020-12-29 DIAGNOSIS — F1020 Alcohol dependence, uncomplicated: Secondary | ICD-10-CM | POA: Insufficient documentation

## 2020-12-29 DIAGNOSIS — Y908 Blood alcohol level of 240 mg/100 ml or more: Secondary | ICD-10-CM | POA: Insufficient documentation

## 2020-12-29 DIAGNOSIS — Z79899 Other long term (current) drug therapy: Secondary | ICD-10-CM | POA: Diagnosis not present

## 2020-12-29 DIAGNOSIS — F1011 Alcohol abuse, in remission: Secondary | ICD-10-CM | POA: Diagnosis present

## 2020-12-29 DIAGNOSIS — E872 Acidosis, unspecified: Secondary | ICD-10-CM

## 2020-12-29 DIAGNOSIS — F10982 Alcohol use, unspecified with alcohol-induced sleep disorder: Secondary | ICD-10-CM | POA: Diagnosis present

## 2020-12-29 DIAGNOSIS — F32A Depression, unspecified: Secondary | ICD-10-CM | POA: Insufficient documentation

## 2020-12-29 DIAGNOSIS — N529 Male erectile dysfunction, unspecified: Secondary | ICD-10-CM | POA: Diagnosis present

## 2020-12-29 DIAGNOSIS — I10 Essential (primary) hypertension: Secondary | ICD-10-CM | POA: Diagnosis not present

## 2020-12-29 LAB — CBC
HCT: 47.2 % (ref 39.0–52.0)
Hemoglobin: 17.1 g/dL — ABNORMAL HIGH (ref 13.0–17.0)
MCH: 34.1 pg — ABNORMAL HIGH (ref 26.0–34.0)
MCHC: 36.2 g/dL — ABNORMAL HIGH (ref 30.0–36.0)
MCV: 94 fL (ref 80.0–100.0)
Platelets: 282 10*3/uL (ref 150–400)
RBC: 5.02 MIL/uL (ref 4.22–5.81)
RDW: 13.1 % (ref 11.5–15.5)
WBC: 9.7 10*3/uL (ref 4.0–10.5)
nRBC: 0 % (ref 0.0–0.2)

## 2020-12-29 LAB — COMPREHENSIVE METABOLIC PANEL
ALT: 90 U/L — ABNORMAL HIGH (ref 0–44)
AST: 122 U/L — ABNORMAL HIGH (ref 15–41)
Albumin: 5 g/dL (ref 3.5–5.0)
Alkaline Phosphatase: 83 U/L (ref 38–126)
Anion gap: 24 — ABNORMAL HIGH (ref 5–15)
BUN: 16 mg/dL (ref 8–23)
CO2: 16 mmol/L — ABNORMAL LOW (ref 22–32)
Calcium: 8.7 mg/dL — ABNORMAL LOW (ref 8.9–10.3)
Chloride: 96 mmol/L — ABNORMAL LOW (ref 98–111)
Creatinine, Ser: 1.19 mg/dL (ref 0.61–1.24)
GFR, Estimated: >60 mL/min (ref >60)
Glucose, Bld: 90 mg/dL (ref 70–99)
Potassium: 3.6 mmol/L (ref 3.5–5.1)
Sodium: 136 mmol/L (ref 135–145)
Total Bilirubin: 1.6 mg/dL — ABNORMAL HIGH (ref 0.3–1.2)
Total Protein: 8.9 g/dL — ABNORMAL HIGH (ref 6.5–8.1)

## 2020-12-29 LAB — SALICYLATE LEVEL: Salicylate Lvl: <7 mg/dL — ABNORMAL LOW (ref 7.0–30.0)

## 2020-12-29 LAB — ACETAMINOPHEN LEVEL: Acetaminophen (Tylenol), Serum: <10 ug/mL — ABNORMAL LOW (ref 10–30)

## 2020-12-29 LAB — ETHANOL: Alcohol, Ethyl (B): 303 mg/dL (ref <10)

## 2020-12-29 MED ORDER — CHLORDIAZEPOXIDE HCL 25 MG PO CAPS
50.0000 mg | ORAL_CAPSULE | Freq: Once | ORAL | Status: AC
  Administered 2020-12-29: 50 mg via ORAL
  Filled 2020-12-29: qty 2

## 2020-12-29 MED ORDER — CHLORDIAZEPOXIDE HCL 25 MG PO CAPS: 25.0000 mg | ORAL_CAPSULE | Freq: Four times a day (QID) | ORAL | Status: DC | PRN

## 2020-12-29 MED ORDER — CHLORDIAZEPOXIDE HCL 25 MG PO CAPS: 25.0000 mg | ORAL_CAPSULE | Freq: Every day | ORAL | Status: DC

## 2020-12-29 MED ORDER — THIAMINE HCL 100 MG/ML IJ SOLN
100.0000 mg | Freq: Once | INTRAMUSCULAR | Status: AC
Start: 2020-12-30 — End: 2020-12-29
  Administered 2020-12-29: 100 mg via INTRAVENOUS
  Filled 2020-12-29: qty 2

## 2020-12-29 MED ORDER — DEXTROSE-NACL 5-0.9 % IV SOLN
2000.0000 mL | Freq: Once | INTRAVENOUS | Status: AC
  Administered 2020-12-30: 2000 mL via INTRAVENOUS

## 2020-12-29 MED ORDER — CHLORDIAZEPOXIDE HCL 25 MG PO CAPS: 25.0000 mg | ORAL_CAPSULE | ORAL | Status: DC

## 2020-12-29 MED ORDER — CHLORDIAZEPOXIDE HCL 25 MG PO CAPS
25.0000 mg | ORAL_CAPSULE | Freq: Four times a day (QID) | ORAL | Status: DC
  Administered 2020-12-30: 25 mg via ORAL
  Filled 2020-12-29: qty 1

## 2020-12-29 MED ORDER — ADULT MULTIVITAMIN W/MINERALS CH: 1.0000 | ORAL_TABLET | Freq: Every day | ORAL | Status: DC

## 2020-12-29 MED ORDER — CHLORDIAZEPOXIDE HCL 25 MG PO CAPS: 25.0000 mg | ORAL_CAPSULE | Freq: Three times a day (TID) | ORAL | Status: DC

## 2020-12-29 MED ORDER — FOLIC ACID 1 MG PO TABS
1.0000 mg | ORAL_TABLET | Freq: Once | ORAL | Status: AC
Start: 2020-12-30 — End: 2020-12-29
  Administered 2020-12-29: 1 mg via ORAL
  Filled 2020-12-29: qty 1

## 2020-12-29 NOTE — ED Triage Notes (Addendum)
Pt to ED for depression. Denies SI/HI States has been stressed over losses.  Pt calm and cooperative in triage  Pt also reports inner ear issues

## 2020-12-29 NOTE — ED Provider Notes (Signed)
Emergency Medicine Provider Triage Evaluation Note  HRISTOPHER MISSILDINE , a 60 y.o. male  was evaluated in triage.  Pt complains of worsening depression and dizziness.  Patient states that he has had a significantly hard year.  He denies having a plan for suicide but states "I do not care if I go".  He denies chest pain, chest tightness or abdominal pain.  He does not currently take any kind of antidepressant and has not started any new medications.  Review of Systems  Positive: Patient has depression.  Negative: No chest pain, chest tightness or abdominal pain.   Physical Exam  BP (!) 130/106   Pulse (!) 112   Temp 98 F (36.7 C)   Resp 20   Ht 5\' 10"  (1.778 m)   Wt 99.8 kg   SpO2 100%   BMI 31.57 kg/m  Gen:   Awake, no distress   Resp:  Normal effort  MSK:   Moves extremities without difficulty  Other:    Medical Decision Making  Medically screening exam initiated at 5:29 PM.  Appropriate orders placed.  Keghan SAMAAD HASHEM was informed that the remainder of the evaluation will be completed by another provider, this initial triage assessment does not replace that evaluation, and the importance of remaining in the ED until their evaluation is complete.     Vallarie Mare Ivan, PA-C 12/29/20 1730    Duffy Bruce, MD 12/30/20 1501
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
## 2020-12-29 NOTE — ED Notes (Signed)
Pt reports drinking alcohol everyday and feels depressed.  Denies Si or HI.  Denies drug use.  Pt works every day.  Pt calm and cooperative   pt in hallway bed.

## 2020-12-29 NOTE — ED Notes (Signed)
Grey underwear [employer] tennis shoes [employer] t-shirt [employer] - $40 cash   Work badge   *sister at pt side in sub wait for past 7 hours  *belongings given to sister per pt

## 2020-12-30 DIAGNOSIS — F1010 Alcohol abuse, uncomplicated: Secondary | ICD-10-CM

## 2020-12-30 LAB — URINALYSIS, COMPLETE (UACMP) WITH MICROSCOPIC
Bacteria, UA: NONE SEEN
Bilirubin Urine: NEGATIVE
Glucose, UA: >=500 mg/dL — AB
Hgb urine dipstick: NEGATIVE
Ketones, ur: 80 mg/dL — AB
Leukocytes,Ua: NEGATIVE
Nitrite: NEGATIVE
Protein, ur: NEGATIVE mg/dL
Specific Gravity, Urine: 1.017 (ref 1.005–1.030)
Squamous Epithelial / HPF: NONE SEEN (ref 0–5)
pH: 5 (ref 5.0–8.0)

## 2020-12-30 LAB — BLOOD GAS, VENOUS
Acid-base deficit: 5.6 mmol/L — ABNORMAL HIGH (ref 0.0–2.0)
Bicarbonate: 18.5 mmol/L — ABNORMAL LOW (ref 20.0–28.0)
O2 Saturation: 84.5 %
Patient temperature: 37
pCO2, Ven: 32 mmHg — ABNORMAL LOW (ref 44.0–60.0)
pH, Ven: 7.37 (ref 7.250–7.430)
pO2, Ven: 51 mmHg — ABNORMAL HIGH (ref 32.0–45.0)

## 2020-12-30 LAB — BASIC METABOLIC PANEL
Anion gap: 7 (ref 5–15)
Anion gap: 9 (ref 5–15)
BUN: 13 mg/dL (ref 8–23)
BUN: 13 mg/dL (ref 8–23)
CO2: 20 mmol/L — ABNORMAL LOW (ref 22–32)
CO2: 22 mmol/L (ref 22–32)
Calcium: 6.8 mg/dL — ABNORMAL LOW (ref 8.9–10.3)
Calcium: 7.7 mg/dL — ABNORMAL LOW (ref 8.9–10.3)
Chloride: 105 mmol/L (ref 98–111)
Chloride: 101 mmol/L (ref 98–111)
Creatinine, Ser: 0.85 mg/dL (ref 0.61–1.24)
Creatinine, Ser: 0.82 mg/dL (ref 0.61–1.24)
GFR, Estimated: >60 mL/min (ref >60)
GFR, Estimated: >60 mL/min (ref >60)
Glucose, Bld: 649 mg/dL (ref 70–99)
Glucose, Bld: 78 mg/dL (ref 70–99)
Potassium: 2.8 mmol/L — ABNORMAL LOW (ref 3.5–5.1)
Potassium: 3.3 mmol/L — ABNORMAL LOW (ref 3.5–5.1)
Sodium: 132 mmol/L — ABNORMAL LOW (ref 135–145)
Sodium: 132 mmol/L — ABNORMAL LOW (ref 135–145)

## 2020-12-30 LAB — URINE DRUG SCREEN, QUALITATIVE (ARMC ONLY)
Amphetamines, Ur Screen: NOT DETECTED
Barbiturates, Ur Screen: NOT DETECTED
Benzodiazepine, Ur Scrn: NOT DETECTED
Cannabinoid 50 Ng, Ur ~~LOC~~: NOT DETECTED
Cocaine Metabolite,Ur ~~LOC~~: NOT DETECTED
MDMA (Ecstasy)Ur Screen: NOT DETECTED
Methadone Scn, Ur: NOT DETECTED
Opiate, Ur Screen: NOT DETECTED
Phencyclidine (PCP) Ur S: NOT DETECTED
Tricyclic, Ur Screen: NOT DETECTED

## 2020-12-30 LAB — LACTIC ACID, PLASMA
Lactic Acid, Venous: 1.7 mmol/L (ref 0.5–1.9)
Lactic Acid, Venous: 3.8 mmol/L (ref 0.5–1.9)
Lactic Acid, Venous: 7.6 mmol/L (ref 0.5–1.9)

## 2020-12-30 LAB — MAGNESIUM: Magnesium: 2.2 mg/dL (ref 1.7–2.4)

## 2020-12-30 MED ORDER — POTASSIUM CHLORIDE CRYS ER 20 MEQ PO TBCR
40.0000 meq | EXTENDED_RELEASE_TABLET | Freq: Once | ORAL | Status: AC
  Administered 2020-12-30: 40 meq via ORAL
  Filled 2020-12-30: qty 2

## 2020-12-30 MED ORDER — SODIUM CHLORIDE 0.9 % IV BOLUS
1000.0000 mL | Freq: Once | INTRAVENOUS | Status: AC
  Administered 2020-12-30: 1000 mL via INTRAVENOUS

## 2020-12-30 MED ORDER — CHLORDIAZEPOXIDE HCL 25 MG PO CAPS: ORAL_CAPSULE | ORAL | 0 refills | Status: AC

## 2020-12-30 MED ORDER — POTASSIUM CHLORIDE 10 MEQ/100ML IV SOLN
10.0000 meq | Freq: Once | INTRAVENOUS | Status: AC
  Administered 2020-12-30: 10 meq via INTRAVENOUS
  Filled 2020-12-30: qty 100

## 2020-12-30 MED ORDER — THIAMINE HCL 100 MG PO TABS: 100.0000 mg | ORAL_TABLET | Freq: Every day | ORAL | 0 refills | Status: AC

## 2020-12-30 NOTE — Discharge Instructions (Signed)
Please take the Librium taper over the next 4 days.  If you develop increasing tremulousness or having any hallucinations, please return to the emergency department.  You should start taking the thiamine daily.
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
## 2020-12-30 NOTE — BH Assessment (Signed)
Referral information for inpatient substance abuse treatment:   [facility] [phone])  [facility] [phone])  [facility] [phone] or [phone])  [facility] [phone])  [facility] [phone])    [facility] [phone])

## 2020-12-30 NOTE — ED Provider Notes (Signed)
-----------------------------------------   8:06 AM on 12/30/2020 ----------------------------------------- Patient care assumed from Dr. Ysidro Evert.  Patient's work-up is reassuring.  BMP has essentially normalized.  Patient still wishes to be discharged with a Librium taper.  I discussed with the patient not to drink alcohol while taking Librium and if he does relapse to discontinue Librium immediately.  We will provide outpatient resources for the patient.  Patient agreeable to plan of care and is ready to go home.   Harvest Dark, MD 12/30/20 [phone]

## 2020-12-30 NOTE — ED Notes (Signed)
Repeat BMP sent
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
## 2020-12-30 NOTE — ED Notes (Signed)
Pt still has not voided.

## 2021-01-05 ENCOUNTER — Encounter: Payer: Self-pay | Admitting: Internal Medicine

## 2021-01-05 ENCOUNTER — Ambulatory Visit: Payer: 59 | Admitting: Internal Medicine

## 2021-01-05 ENCOUNTER — Other Ambulatory Visit: Payer: Self-pay

## 2021-01-05 VITALS — BP 128/84 | HR 75 | Temp 96.4°F | Ht 70.0 in | Wt 216.0 lb

## 2021-01-05 DIAGNOSIS — F1010 Alcohol abuse, uncomplicated: Secondary | ICD-10-CM | POA: Diagnosis not present

## 2021-01-05 DIAGNOSIS — F321 Major depressive disorder, single episode, moderate: Secondary | ICD-10-CM | POA: Diagnosis not present

## 2021-01-05 DIAGNOSIS — Z0279 Encounter for issue of other medical certificate: Secondary | ICD-10-CM

## 2021-01-05 DIAGNOSIS — F10982 Alcohol use, unspecified with alcohol-induced sleep disorder: Secondary | ICD-10-CM

## 2021-01-05 DIAGNOSIS — R739 Hyperglycemia, unspecified: Secondary | ICD-10-CM

## 2021-01-05 DIAGNOSIS — K7010 Alcoholic hepatitis without ascites: Secondary | ICD-10-CM

## 2021-01-05 DIAGNOSIS — E785 Hyperlipidemia, unspecified: Secondary | ICD-10-CM | POA: Diagnosis not present

## 2021-01-05 DIAGNOSIS — E872 Acidosis, unspecified: Secondary | ICD-10-CM

## 2021-01-05 LAB — COMPREHENSIVE METABOLIC PANEL
ALT: 122 U/L — ABNORMAL HIGH (ref 0–53)
AST: 87 U/L — ABNORMAL HIGH (ref 0–37)
Albumin: 4.5 g/dL (ref 3.5–5.2)
Alkaline Phosphatase: 57 U/L (ref 39–117)
BUN: 14 mg/dL (ref 6–23)
CO2: 30 mEq/L (ref 19–32)
Calcium: 10.1 mg/dL (ref 8.4–10.5)
Chloride: 102 mEq/L (ref 96–112)
Creatinine, Ser: 0.96 mg/dL (ref 0.40–1.50)
GFR: 84.9 mL/min (ref >60.00)
Glucose, Bld: 92 mg/dL (ref 70–99)
Potassium: 3.4 mEq/L — ABNORMAL LOW (ref 3.5–5.1)
Sodium: 140 mEq/L (ref 135–145)
Total Bilirubin: 0.9 mg/dL (ref 0.2–1.2)
Total Protein: 7.5 g/dL (ref 6.0–8.3)

## 2021-01-05 LAB — B12 AND FOLATE PANEL
Folate: 9.4 ng/mL (ref >5.9)
Vitamin B-12: 633 pg/mL (ref 211–911)

## 2021-01-05 MED ORDER — SERTRALINE HCL 50 MG PO TABS: 50.0000 mg | ORAL_TABLET | Freq: Every day | ORAL | 3 refills | Status: DC

## 2021-01-05 NOTE — Progress Notes (Addendum)
Subjective:  Patient ID: Jonathan Simon, male    DOB: 1960/04/05  Age: 60 y.o. MRN: [id number]  CC: The primary encounter diagnosis was Alcoholic hepatitis, unspecified whether ascites present. 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  HPI Jonathan Simon presents for  Chief Complaint  Patient presents with   Hospitalization [facility] Follow up on depression   This visit occurred during the SARS-CoV-2 public health emergency.  Safety protocols were in place, including screening questions prior to the visit, additional usage of staff PPE, and extensive cleaning of exam room while observing appropriate contact time as indicated for disinfecting solutions.   Treated overnight at [facility] Oct 31 for lactic acidosis and alcoholic hepatitis secondary to alcohol abuse.  He received IV fluids, over several hours and his gap and lactic acidosis resolved.  He was discharged home on Nov 1 with librium taper over 4 days which he has finished. Last dose of Friday . He stayed home from work to recover nd returned to work on Nov 4.  Family supportive  Has not started AA yet.  FMLA papers needed.  Missed 3 days last week  due to waking up depressed .  Started drinking daily   A WEEK AFTER THE SEPARATION    Triggers:   COVID infection . Since then has felt more emotionally labile.  Separated last June from his wife of 27 years  ,  signed the divorce papers this June .  Has been drinking since the separation.   Has plenty of family support and attends churhc d    He has been having increased insomnia.  Tried trazodone 100 mg 2 hours before bedtime,   leaves him groggy,  50 mg dose wakes up after 2 hours   Outpatient Medications Prior to Visit  Medication Sig Dispense Refill   albuterol (PROVENTIL HFA;VENTOLIN HFA) 108 (90 BASE) MCG/ACT inhaler Inhale 2 puffs into the lungs every 6 (six) hours as needed for wheezing. 1 Inhaler 0   ALPRAZolam (XANAX) 0.5 MG tablet Take 1 tablet (0.5 mg total) by mouth at bedtime as needed for anxiety. 20 tablet 0   amLODipine (NORVASC) 10 MG tablet TAKE 1 TABLET BY MOUTH DAILY 30 tablet 1   hydrochlorothiazide (HYDRODIURIL) 25 MG tablet Take 1 tablet (25 mg total) by mouth daily. 90 tablet 3   losartan (COZAAR) 100 MG tablet Take 1 tablet (100 mg total) by mouth daily. 90 tablet 3   metoprolol succinate (TOPROL-XL) 25 MG 24 hr tablet TAKE 1 TABLET BY MOUTH DAILY 30 tablet 5   Multiple Vitamin (MULTIVITAMIN) capsule Take 1 capsule by mouth daily.     niacin 500 MG tablet Take 500 mg by mouth daily with breakfast.       Omega-3 Fatty Acids (FISH OIL) 1200 MG CAPS Take 1,200 mg by mouth daily.     omeprazole (PRILOSEC) 20 MG capsule Take 20 mg by mouth daily.     simvastatin (ZOCOR) 20 MG tablet TAKE 1 TABLET BY MOUTH DAILY AT 6PM 30 tablet 1   thiamine 100 MG tablet Take 1 tablet (100 mg total) by mouth daily. 30 tablet 0   traZODone (DESYREL) 50 MG tablet Take 1 tablet (50  mg total) by mouth at bedtime as needed for sleep. Increase to 100 mg if needed 90 tablet 3   benzonatate (TESSALON) 200 MG capsule Take by mouth. (Patient not taking: Reported on 01/05/2021)     HYDROcodone-acetaminophen (NORCO/VICODIN) 5-325 MG tablet Take 1 tablet by mouth every 6 (six) hours as needed for moderate pain. (Patient not taking: Reported on 01/05/2021) 10 tablet 0   methylPREDNISolone (MEDROL DOSEPAK) 4 MG TBPK tablet Take 6 pills on day one then decrease by 1 pill each day (Patient not taking: Reported on 01/05/2021) 21 tablet 0   No facility-administered medications prior to visit.    Review of Systems;  Patient denies  headache, fevers, malaise, unintentional weight loss, skin rash, eye pain, sinus congestion and sinus pain, sore throat, dysphagia,  hemoptysis , cough, dyspnea, wheezing, chest pain, palpitations, orthopnea, edema, abdominal pain, nausea, melena, diarrhea, constipation, flank pain, dysuria, hematuria, urinary  Frequency, nocturia, numbness, tingling, seizures,  Focal weakness, Loss of consciousness,  Tremor, insomnia, depression, anxiety, and suicidal ideation.      Objective:  BP 128/84 (BP Location: Left Arm, Patient Position: Sitting, Cuff Size: Large)   Pulse 75   Temp (!) 96.4 F (35.8 C) (Temporal)   Ht 5\' 10"  (1.778 m)   Wt 216 lb (98 kg)   SpO2 97%   BMI 30.99 kg/m   BP Readings from Last 3 Encounters:  01/05/21 128/84  12/30/20 (!) 147/80  10/01/20 140/80    Wt Readings from Last 3 Encounters:  01/05/21 216 lb (98 kg)  12/29/20 220 lb (99.8 kg)  10/01/20 212 lb (96.2 kg)    General appearance: alert, cooperative and appears stated age Ears: normal TM's and external ear canals both ears Throat: lips, mucosa, and tongue normal; teeth and gums normal Neck: no adenopathy, no carotid bruit, supple, symmetrical, trachea midline and thyroid not enlarged, symmetric, no tenderness/mass/nodules Back: symmetric, no curvature. ROM normal. No CVA tenderness. Lungs: clear to auscultation bilaterally Heart: regular rate and rhythm, S1, S2 normal, no murmur, click, rub or gallop Abdomen: soft, non-tender; bowel sounds normal; no masses,  no organomegaly Pulses: 2+ and symmetric Skin: Skin color, texture, turgor normal. No rashes or lesions Lymph nodes: Cervical, supraclavicular, and axillary nodes normal.  Lab Results  Component Value Date   HGBA1C 5.2 04/22/2016    Lab Results  Component Value Date   CREATININE 0.96 01/05/2021   CREATININE 0.82 12/30/2020   CREATININE 0.85 12/30/2020    Lab Results  Component Value Date   WBC 9.7 12/29/2020   HGB 17.1 (H) 12/29/2020    HCT 47.2 12/29/2020   PLT 282 12/29/2020   GLUCOSE 92 01/05/2021   CHOL 153 06/06/2019   TRIG 105.0 06/06/2019   HDL 45.50 06/06/2019   LDLDIRECT 88.0 05/02/2017   LDLCALC 87 06/06/2019   ALT 122 (H) 01/05/2021   AST 87 (H) 01/05/2021   NA 140 01/05/2021   K 3.4 (L) 01/05/2021   CL 102 01/05/2021   CREATININE 0.96 01/05/2021   BUN 14 01/05/2021   CO2 30 01/05/2021   TSH 1.03 09/24/2019   PSA 0.79 07/07/2018   INR 1.1 01/02/2020   HGBA1C 5.2 04/22/2016    No results found.  Assessment & Plan:   Problem List Items Addressed This Visit     Hyperlipidemia with target LDL less than 100 (Chronic)    LDL has been at  goal on simvastatin 10 mg daily (1/2 tablet )  .  LFts are elevated , likely due  to alcoholic hepatitis,  And will need repeating   Lab Results  Component Value Date   CHOL 153 06/06/2019   HDL 45.50 06/06/2019   LDLCALC 87 06/06/2019   LDLDIRECT 88.0 05/02/2017   TRIG 105.0 06/06/2019   CHOLHDL 3 06/06/2019   Lab Results  Component Value Date   ALT 122 (H) 01/05/2021   AST 87 (H) 01/05/2021   ALKPHOS 57 01/05/2021   BILITOT 0.9 01/05/2021         Alcohol abuse    Recurrent, with recent ER evaluation for alcoholic hepatitis and lactic acidosis.  He was given a librium taper to avoid withdrawal and completed the taper without complications.  He is currently abstinent Recommending AA attendance in addition to psychiatry referral (requested by patient) fr concurrent depression  .       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 Continue trazodone       Hepatitis, alcoholic - Primary    AST is  improving but ALT is rising.  Repeat labs needed in a few days       Relevant Orders   Comprehensive metabolic panel (Completed)   Comprehensive metabolic panel   Major depressive disorder with current active episode    Complicated by divorce and alcoholism.  Initiating sertraline at 25 mg daily for one week,  The 50 mg daily  Follow up in 3 weeks.  Psychiatry referral in progress per patient request      Relevant Medications   sertraline (ZOLOFT) 50 MG tablet   Other Relevant Orders   Ambulatory referral to Psychiatry   Other Visit Diagnoses     Hyperglycemia       Relevant Orders   Hemoglobin A1c       I have discontinued Alekzander J. Heyde's methylPREDNISolone and HYDROcodone-acetaminophen. I am also having him start on sertraline. Additionally, I am having him maintain his niacin, albuterol, multivitamin, Fish Oil, omeprazole, losartan, hydrochlorothiazide, metoprolol succinate, traZODone, ALPRAZolam, benzonatate, amLODipine, simvastatin, and thiamine.  Meds ordered this encounter  Medications   sertraline (ZOLOFT) 50 MG tablet    Sig: Take 1 tablet (50 mg total) by mouth daily.    Dispense:  90 tablet    Refill:  3    Medications Discontinued During This Encounter  Medication Reason   methylPREDNISolone (MEDROL DOSEPAK) 4 MG TBPK tablet    HYDROcodone-acetaminophen (NORCO/VICODIN) 5-325 MG tablet     Follow-up: Return in about 4 weeks (around 02/02/2021).   Crecencio Mc, MD

## 2021-01-05 NOTE — Patient Instructions (Addendum)
  For the insomnia:  Please add melatonin 5 mg at 7 pm   continue 50 mg trazodone when you leave work    For the depression:  Please start the sertraline at 1/2 tablet daily  With a full breakfast  for the first week to avoid nausea.  You can increase to a full tablet after 6 days if you havenot developed side effects of nausea.  If the sertraline  interferes with your sleep, take it in the morning instead  Please return in  4 weeks ,  Or e mail me to let me know how it is helping your depression   I have made a psychiatry referral    For the alcohol dependence:  Please consider attending AA DAILY . Many of the meetings can be done virtually.

## 2021-01-06 ENCOUNTER — Telehealth: Payer: Self-pay | Admitting: Internal Medicine

## 2021-01-06 DIAGNOSIS — F419 Anxiety disorder, unspecified: Secondary | ICD-10-CM | POA: Insufficient documentation

## 2021-01-06 DIAGNOSIS — K7010 Alcoholic hepatitis without ascites: Secondary | ICD-10-CM | POA: Insufficient documentation

## 2021-01-06 DIAGNOSIS — F329 Major depressive disorder, single episode, unspecified: Secondary | ICD-10-CM | POA: Insufficient documentation

## 2021-01-06 DIAGNOSIS — F32A Depression, unspecified: Secondary | ICD-10-CM | POA: Insufficient documentation

## 2021-01-06 NOTE — Assessment & Plan Note (Signed)
Complicated by divorce and alcoholism.  Initiating sertraline at 25 mg daily for one week,  The 50 mg daily  Follow up in 3 weeks.  Psychiatry referral in progress per patient request

## 2021-01-06 NOTE — Assessment & Plan Note (Signed)
AST is improving but ALT is rising.  Repeat labs needed in a few days

## 2021-01-06 NOTE — Telephone Encounter (Signed)
Patient saw Dr Derrel Nip on 01/05/21. Dr Derrel Nip said she would fill out and fax to patient's work today.

## 2021-01-06 NOTE — Assessment & Plan Note (Signed)
Advised to add melatonin at dinner time. Continue trazodone

## 2021-01-06 NOTE — Assessment & Plan Note (Signed)
LDL has been at  goal on simvastatin 10 mg daily (1/2 tablet )  .  LFts are elevated , likely due to alcoholic hepatitis,  And will need repeating   Lab Results  Component Value Date   CHOL 153 06/06/2019   HDL 45.50 06/06/2019   LDLCALC 87 06/06/2019   LDLDIRECT 88.0 05/02/2017   TRIG 105.0 06/06/2019   CHOLHDL 3 06/06/2019   Lab Results  Component Value Date   ALT 122 (H) 01/05/2021   AST 87 (H) 01/05/2021   ALKPHOS 57 01/05/2021   BILITOT 0.9 01/05/2021

## 2021-01-06 NOTE — Telephone Encounter (Signed)
Attempted to call pt to let him know that paper work has been completed and faxed. No answer no voicemail.

## 2021-01-06 NOTE — Assessment & Plan Note (Addendum)
Recurrent, with recent ER evaluation for alcoholic hepatitis and lactic acidosis.  He was given a librium taper to avoid withdrawal and completed the taper without complications.  He is currently abstinent Recommending AA attendance in addition to psychiatry referral (requested by patient) fr concurrent depression  .

## 2021-01-06 NOTE — Assessment & Plan Note (Signed)
Resolved by repeat labs done in ER after IV fluid resuscitation  Lab Results  Component Value Date   NA 140 01/05/2021   K 3.4 (L) 01/05/2021   CL 102 01/05/2021   CO2 30 01/05/2021

## 2021-01-07 NOTE — Addendum Note (Signed)
Addended by: Crecencio Mc on: 01/07/2021 12:56 PM   Modules accepted: Orders

## 2021-01-07 NOTE — Telephone Encounter (Signed)
noted 

## 2021-01-07 NOTE — Telephone Encounter (Signed)
Patient returned office phone call. Note was read about FMLA paper work being faxed out. Patient said , Thank you.

## 2021-01-09 ENCOUNTER — Other Ambulatory Visit: Payer: Self-pay | Admitting: Internal Medicine

## 2021-01-09 LAB — VITAMIN B1: Vitamin B1 (Thiamine): 22 nmol/L (ref 8–30)

## 2021-01-14 ENCOUNTER — Other Ambulatory Visit (INDEPENDENT_AMBULATORY_CARE_PROVIDER_SITE_OTHER): Payer: 59

## 2021-01-14 ENCOUNTER — Other Ambulatory Visit: Payer: Self-pay

## 2021-01-14 DIAGNOSIS — K7010 Alcoholic hepatitis without ascites: Secondary | ICD-10-CM | POA: Diagnosis not present

## 2021-01-14 LAB — COMPREHENSIVE METABOLIC PANEL
ALT: 40 U/L (ref 0–53)
AST: 31 U/L (ref 0–37)
Albumin: 4.4 g/dL (ref 3.5–5.2)
Alkaline Phosphatase: 68 U/L (ref 39–117)
BUN: 12 mg/dL (ref 6–23)
CO2: 29 mEq/L (ref 19–32)
Calcium: 9.6 mg/dL (ref 8.4–10.5)
Chloride: 102 mEq/L (ref 96–112)
Creatinine, Ser: 0.87 mg/dL (ref 0.40–1.50)
GFR: 92.66 mL/min (ref >60.00)
Glucose, Bld: 88 mg/dL (ref 70–99)
Potassium: 3.6 mEq/L (ref 3.5–5.1)
Sodium: 140 mEq/L (ref 135–145)
Total Bilirubin: 0.8 mg/dL (ref 0.2–1.2)
Total Protein: 6.9 g/dL (ref 6.0–8.3)

## 2021-02-11 ENCOUNTER — Ambulatory Visit: Payer: 59 | Admitting: Internal Medicine

## 2021-03-30 ENCOUNTER — Ambulatory Visit: Payer: 59 | Admitting: Internal Medicine

## 2021-04-02 ENCOUNTER — Other Ambulatory Visit: Payer: Self-pay | Admitting: Internal Medicine

## 2021-04-22 ENCOUNTER — Ambulatory Visit: Payer: 59 | Admitting: Internal Medicine

## 2021-04-30 ENCOUNTER — Other Ambulatory Visit: Payer: Self-pay

## 2021-04-30 ENCOUNTER — Emergency Department: Payer: 59

## 2021-04-30 ENCOUNTER — Inpatient Hospital Stay
Admission: EM | Admit: 2021-04-30 | Discharge: 2021-05-04 | DRG: 392 | Disposition: A | Discharge status: Home / Self Care | Payer: 59 | Attending: Student in an Organized Health Care Education/Training Program | Admitting: Student in an Organized Health Care Education/Training Program

## 2021-04-30 DIAGNOSIS — N179 Acute kidney failure, unspecified: Secondary | ICD-10-CM

## 2021-04-30 DIAGNOSIS — E861 Hypovolemia: Secondary | ICD-10-CM | POA: Diagnosis present

## 2021-04-30 DIAGNOSIS — D6959 Other secondary thrombocytopenia: Secondary | ICD-10-CM | POA: Diagnosis present

## 2021-04-30 DIAGNOSIS — R7989 Other specified abnormal findings of blood chemistry: Secondary | ICD-10-CM | POA: Diagnosis not present

## 2021-04-30 DIAGNOSIS — K5732 Diverticulitis of large intestine without perforation or abscess without bleeding: Principal | ICD-10-CM | POA: Diagnosis present

## 2021-04-30 DIAGNOSIS — K5792 Diverticulitis of intestine, part unspecified, without perforation or abscess without bleeding: Principal | ICD-10-CM | POA: Diagnosis present

## 2021-04-30 DIAGNOSIS — Z79899 Other long term (current) drug therapy: Secondary | ICD-10-CM

## 2021-04-30 DIAGNOSIS — E876 Hypokalemia: Secondary | ICD-10-CM

## 2021-04-30 DIAGNOSIS — Z8616 Personal history of COVID-19: Secondary | ICD-10-CM

## 2021-04-30 DIAGNOSIS — E669 Obesity, unspecified: Secondary | ICD-10-CM | POA: Diagnosis present

## 2021-04-30 DIAGNOSIS — E872 Acidosis, unspecified: Secondary | ICD-10-CM | POA: Diagnosis present

## 2021-04-30 DIAGNOSIS — E878 Other disorders of electrolyte and fluid balance, not elsewhere classified: Secondary | ICD-10-CM | POA: Diagnosis present

## 2021-04-30 DIAGNOSIS — K219 Gastro-esophageal reflux disease without esophagitis: Secondary | ICD-10-CM | POA: Diagnosis present

## 2021-04-30 DIAGNOSIS — F32A Depression, unspecified: Secondary | ICD-10-CM | POA: Diagnosis present

## 2021-04-30 DIAGNOSIS — K7030 Alcoholic cirrhosis of liver without ascites: Secondary | ICD-10-CM

## 2021-04-30 DIAGNOSIS — B179 Acute viral hepatitis, unspecified: Secondary | ICD-10-CM | POA: Diagnosis present

## 2021-04-30 DIAGNOSIS — Y900 Blood alcohol level of less than 20 mg/100 ml: Secondary | ICD-10-CM | POA: Diagnosis present

## 2021-04-30 DIAGNOSIS — Z91040 Latex allergy status: Secondary | ICD-10-CM

## 2021-04-30 DIAGNOSIS — Z6830 Body mass index (BMI) 30.0-30.9, adult: Secondary | ICD-10-CM

## 2021-04-30 DIAGNOSIS — F419 Anxiety disorder, unspecified: Secondary | ICD-10-CM | POA: Diagnosis present

## 2021-04-30 DIAGNOSIS — E785 Hyperlipidemia, unspecified: Secondary | ICD-10-CM | POA: Diagnosis present

## 2021-04-30 DIAGNOSIS — I10 Essential (primary) hypertension: Secondary | ICD-10-CM | POA: Diagnosis present

## 2021-04-30 DIAGNOSIS — Z825 Family history of asthma and other chronic lower respiratory diseases: Secondary | ICD-10-CM

## 2021-04-30 DIAGNOSIS — Z8249 Family history of ischemic heart disease and other diseases of the circulatory system: Secondary | ICD-10-CM

## 2021-04-30 DIAGNOSIS — Z20822 Contact with and (suspected) exposure to covid-19: Secondary | ICD-10-CM | POA: Diagnosis present

## 2021-04-30 DIAGNOSIS — F1021 Alcohol dependence, in remission: Secondary | ICD-10-CM | POA: Diagnosis present

## 2021-04-30 DIAGNOSIS — E871 Hypo-osmolality and hyponatremia: Secondary | ICD-10-CM | POA: Diagnosis present

## 2021-04-30 HISTORY — DX: Diverticulitis of intestine, part unspecified, without perforation or abscess without bleeding: K57.92

## 2021-04-30 LAB — COMPREHENSIVE METABOLIC PANEL
ALT: 294 U/L — ABNORMAL HIGH (ref 0–44)
AST: 397 U/L — ABNORMAL HIGH (ref 15–41)
Albumin: 4.3 g/dL (ref 3.5–5.0)
Alkaline Phosphatase: 122 U/L (ref 38–126)
Anion gap: 25 — ABNORMAL HIGH (ref 5–15)
BUN: 35 mg/dL — ABNORMAL HIGH (ref 8–23)
CO2: 20 mmol/L — ABNORMAL LOW (ref 22–32)
Calcium: 7.4 mg/dL — ABNORMAL LOW (ref 8.9–10.3)
Chloride: 81 mmol/L — ABNORMAL LOW (ref 98–111)
Creatinine, Ser: 3.62 mg/dL — ABNORMAL HIGH (ref 0.61–1.24)
GFR, Estimated: 18 mL/min — ABNORMAL LOW (ref >60)
Glucose, Bld: 127 mg/dL — ABNORMAL HIGH (ref 70–99)
Potassium: 3.1 mmol/L — ABNORMAL LOW (ref 3.5–5.1)
Sodium: 126 mmol/L — ABNORMAL LOW (ref 135–145)
Total Bilirubin: 3.9 mg/dL — ABNORMAL HIGH (ref 0.3–1.2)
Total Protein: 7.8 g/dL (ref 6.5–8.1)

## 2021-04-30 LAB — URINALYSIS, ROUTINE W REFLEX MICROSCOPIC
Bilirubin Urine: NEGATIVE
Glucose, UA: NEGATIVE mg/dL
Hgb urine dipstick: NEGATIVE
Ketones, ur: 5 mg/dL — AB
Leukocytes,Ua: NEGATIVE
Nitrite: NEGATIVE
Protein, ur: NEGATIVE mg/dL
Specific Gravity, Urine: 1.008 (ref 1.005–1.030)
pH: 5 (ref 5.0–8.0)

## 2021-04-30 LAB — CBC
HCT: 39.7 % (ref 39.0–52.0)
Hemoglobin: 14.7 g/dL (ref 13.0–17.0)
MCH: 33.3 pg (ref 26.0–34.0)
MCHC: 37 g/dL — ABNORMAL HIGH (ref 30.0–36.0)
MCV: 90 fL (ref 80.0–100.0)
Platelets: 124 10*3/uL — ABNORMAL LOW (ref 150–400)
RBC: 4.41 MIL/uL (ref 4.22–5.81)
RDW: 14.2 % (ref 11.5–15.5)
WBC: 6 10*3/uL (ref 4.0–10.5)
nRBC: 0 % (ref 0.0–0.2)

## 2021-04-30 LAB — CK: Total CK: 97 U/L (ref 49–397)

## 2021-04-30 LAB — URINE DRUG SCREEN, QUALITATIVE (ARMC ONLY)
Amphetamines, Ur Screen: NOT DETECTED
Barbiturates, Ur Screen: NOT DETECTED
Benzodiazepine, Ur Scrn: POSITIVE — AB
Cannabinoid 50 Ng, Ur ~~LOC~~: NOT DETECTED
Cocaine Metabolite,Ur ~~LOC~~: NOT DETECTED
MDMA (Ecstasy)Ur Screen: NOT DETECTED
Methadone Scn, Ur: NOT DETECTED
Opiate, Ur Screen: NOT DETECTED
Phencyclidine (PCP) Ur S: NOT DETECTED
Tricyclic, Ur Screen: NOT DETECTED

## 2021-04-30 LAB — BASIC METABOLIC PANEL
Anion gap: 20 — ABNORMAL HIGH (ref 5–15)
BUN: 35 mg/dL — ABNORMAL HIGH (ref 8–23)
CO2: 24 mmol/L (ref 22–32)
Calcium: 7 mg/dL — ABNORMAL LOW (ref 8.9–10.3)
Chloride: 83 mmol/L — ABNORMAL LOW (ref 98–111)
Creatinine, Ser: 3.47 mg/dL — ABNORMAL HIGH (ref 0.61–1.24)
GFR, Estimated: 19 mL/min — ABNORMAL LOW (ref >60)
Glucose, Bld: 116 mg/dL — ABNORMAL HIGH (ref 70–99)
Potassium: 3.3 mmol/L — ABNORMAL LOW (ref 3.5–5.1)
Sodium: 127 mmol/L — ABNORMAL LOW (ref 135–145)

## 2021-04-30 LAB — LIPASE, BLOOD: Lipase: 59 U/L — ABNORMAL HIGH (ref 11–51)

## 2021-04-30 LAB — LACTIC ACID, PLASMA
Lactic Acid, Venous: 1.7 mmol/L (ref 0.5–1.9)
Lactic Acid, Venous: 1.5 mmol/L (ref 0.5–1.9)

## 2021-04-30 LAB — RETICULOCYTES
Immature Retic Fract: 6.7 % (ref 2.3–15.9)
RBC.: 4.08 MIL/uL — ABNORMAL LOW (ref 4.22–5.81)
Retic Count, Absolute: 64.9 10*3/uL (ref 19.0–186.0)
Retic Ct Pct: 1.6 % (ref 0.4–3.1)

## 2021-04-30 LAB — HEPATIC FUNCTION PANEL
ALT: 296 U/L — ABNORMAL HIGH (ref 0–44)
AST: 414 U/L — ABNORMAL HIGH (ref 15–41)
Albumin: 4.1 g/dL (ref 3.5–5.0)
Alkaline Phosphatase: 121 U/L (ref 38–126)
Bilirubin, Direct: 1.5 mg/dL — ABNORMAL HIGH (ref 0.0–0.2)
Indirect Bilirubin: 2.6 mg/dL — ABNORMAL HIGH (ref 0.3–0.9)
Total Bilirubin: 4.1 mg/dL — ABNORMAL HIGH (ref 0.3–1.2)
Total Protein: 7.8 g/dL (ref 6.5–8.1)

## 2021-04-30 LAB — RESP PANEL BY RT-PCR (FLU A&B, COVID) ARPGX2
Influenza A by PCR: NEGATIVE
Influenza B by PCR: NEGATIVE
SARS Coronavirus 2 by RT PCR: NEGATIVE

## 2021-04-30 LAB — ACETAMINOPHEN LEVEL: Acetaminophen (Tylenol), Serum: <10 ug/mL — ABNORMAL LOW (ref 10–30)

## 2021-04-30 LAB — CREATININE, URINE, RANDOM: Creatinine, Urine: 144 mg/dL

## 2021-04-30 LAB — LACTATE DEHYDROGENASE: LDH: 283 U/L — ABNORMAL HIGH (ref 98–192)

## 2021-04-30 LAB — MAGNESIUM
Magnesium: 0.9 mg/dL — CL (ref 1.7–2.4)
Magnesium: 1.7 mg/dL (ref 1.7–2.4)

## 2021-04-30 LAB — ETHANOL: Alcohol, Ethyl (B): <10 mg/dL (ref <10)

## 2021-04-30 LAB — SODIUM, URINE, RANDOM: Sodium, Ur: 25 mmol/L

## 2021-04-30 LAB — SALICYLATE LEVEL: Salicylate Lvl: <7 mg/dL — ABNORMAL LOW (ref 7.0–30.0)

## 2021-04-30 LAB — PROTIME-INR
INR: 1 (ref 0.8–1.2)
Prothrombin Time: 13.3 seconds (ref 11.4–15.2)

## 2021-04-30 LAB — PHOSPHORUS: Phosphorus: 6.1 mg/dL — ABNORMAL HIGH (ref 2.5–4.6)

## 2021-04-30 IMAGING — US US ABDOMEN LIMITED
1 series · 14 of 25 positions shown · non-contrast
Comparison: Same day CT of the abdomen pelvis.

CLINICAL DATA: Abnormal LFTs

EXAM:
ULTRASOUND ABDOMEN LIMITED RIGHT UPPER QUADRANT

[Series 1: us abdomen limited ruq (liver/gb) · 14 of 47 slices shown]
[im 1/47]
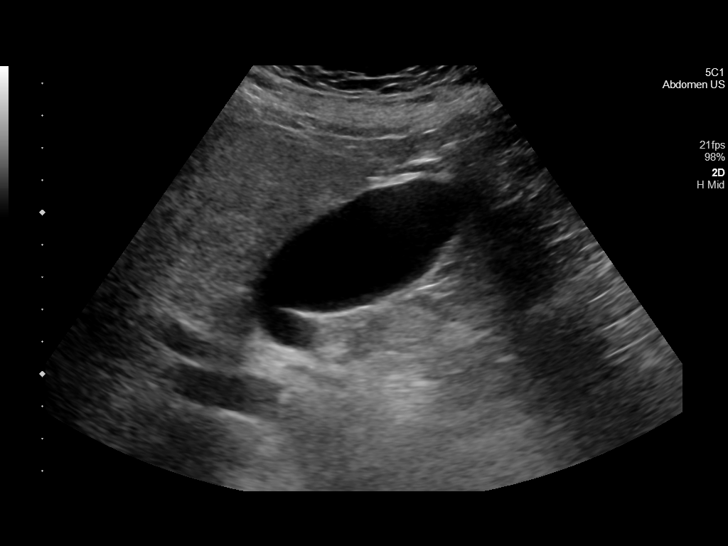
[im 4/47]
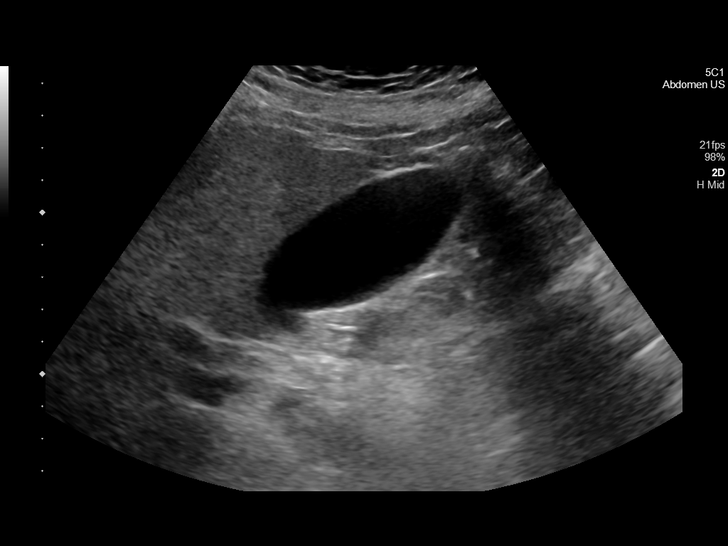
[im 8/47]
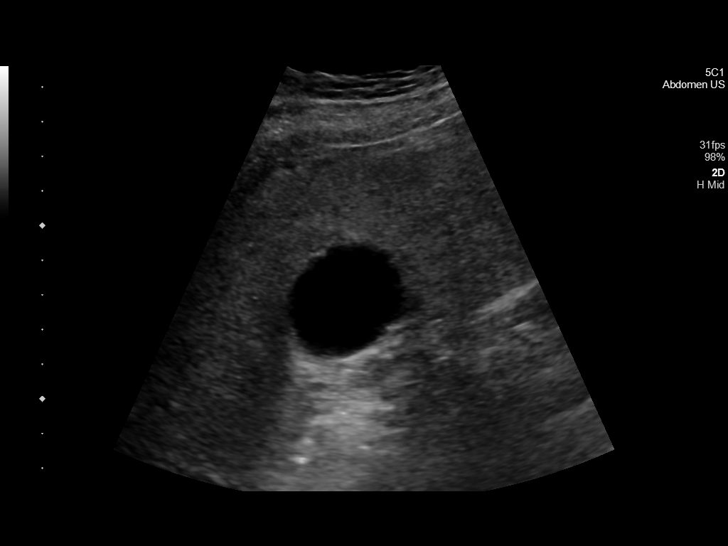
[im 12/47]
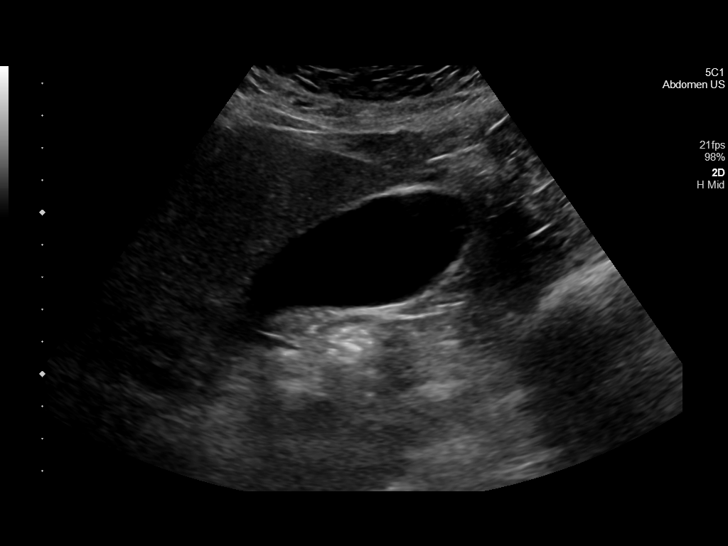
[im 16/47]
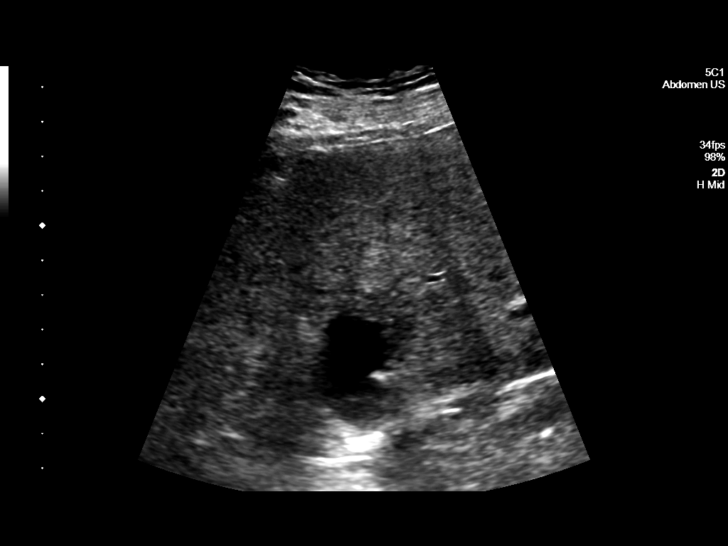
[im 18/47]
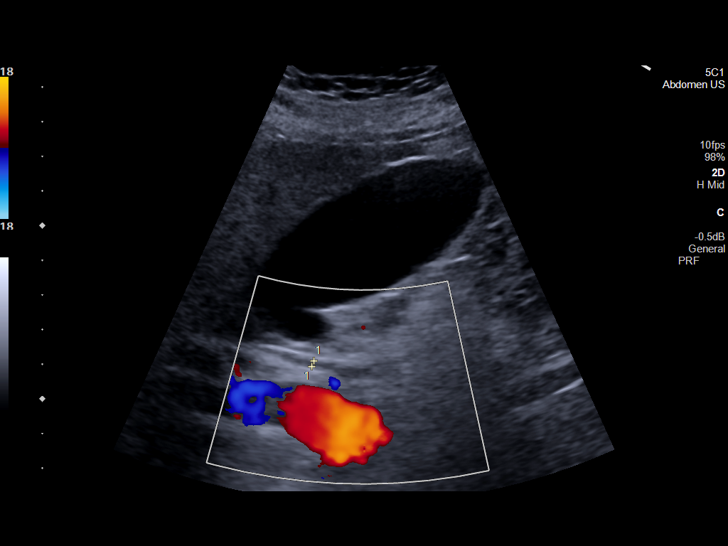
[im 22/47]
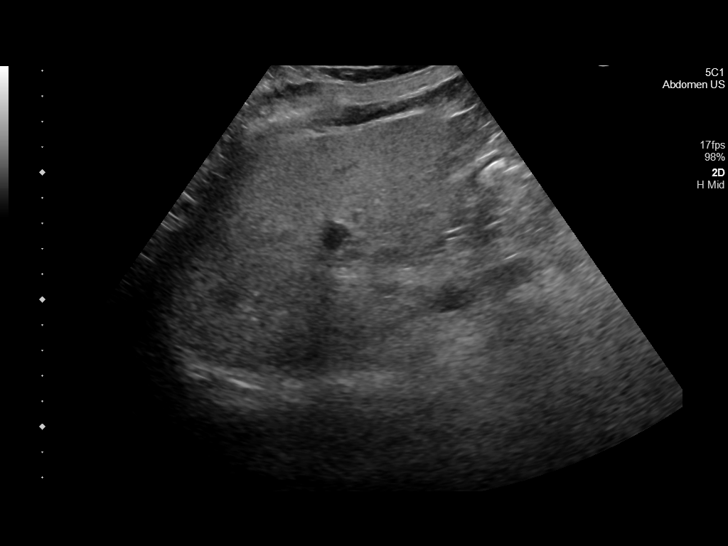
[im 25/47]
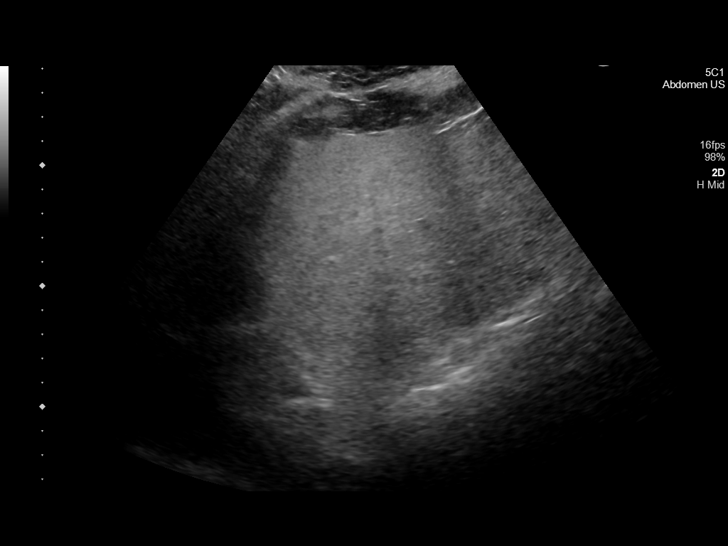
[im 29/47]
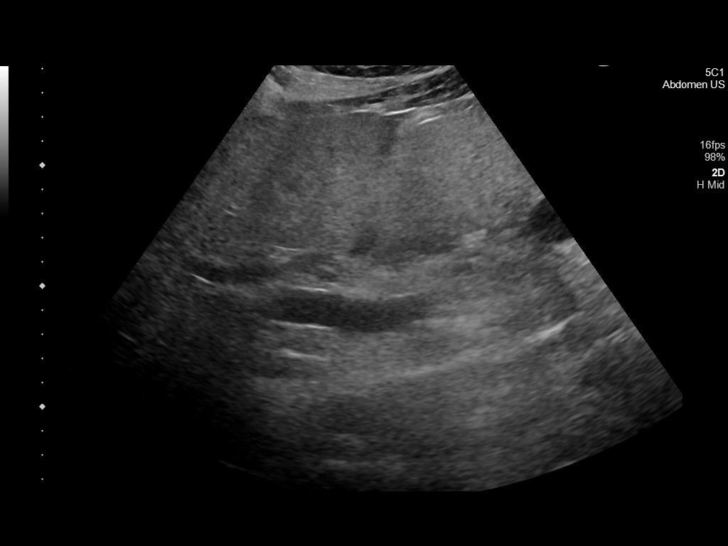
[im 31/47]
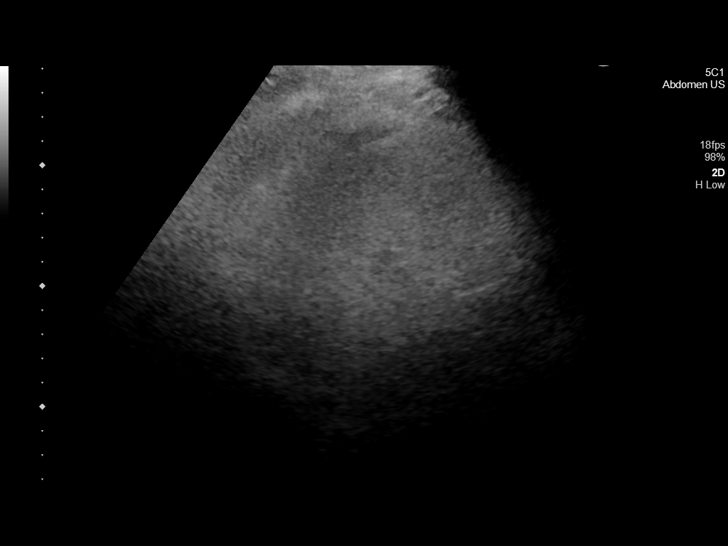
[im 35/47]
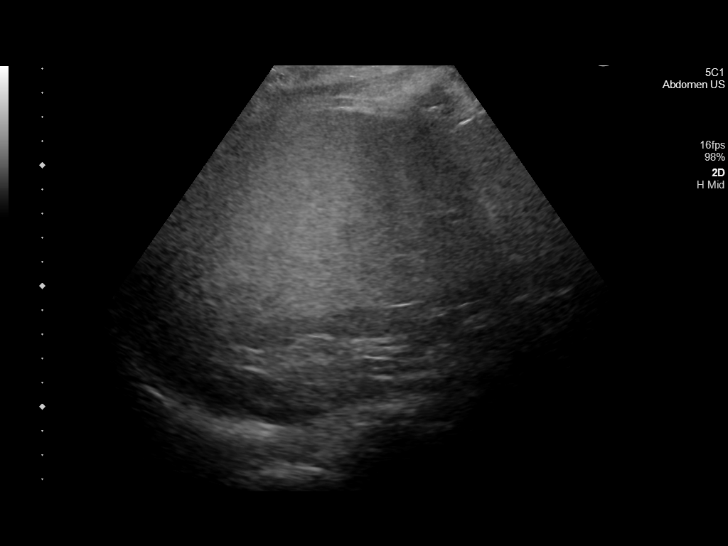
[im 39/47]
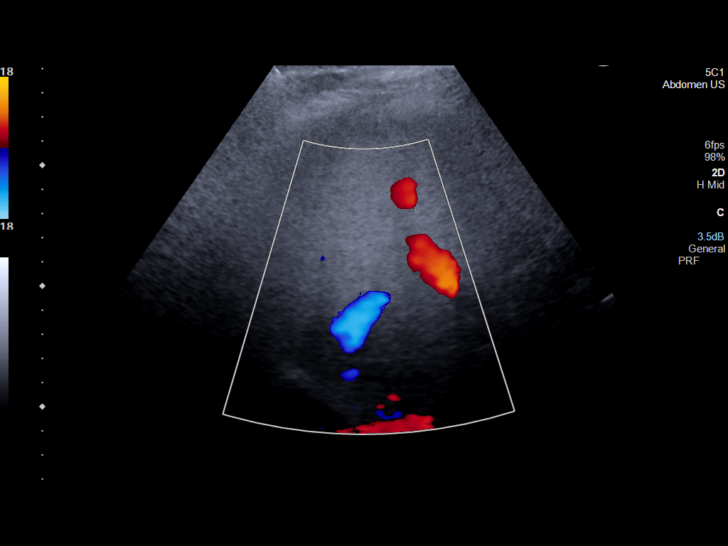
[im 43/47]
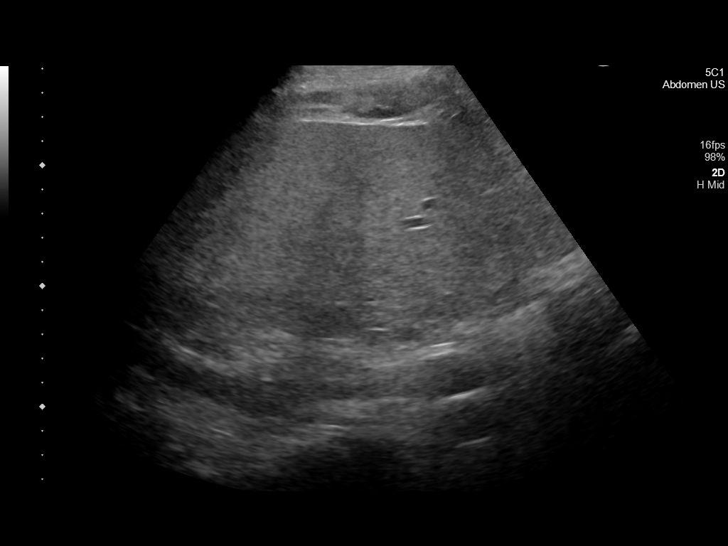
[im 47/47]
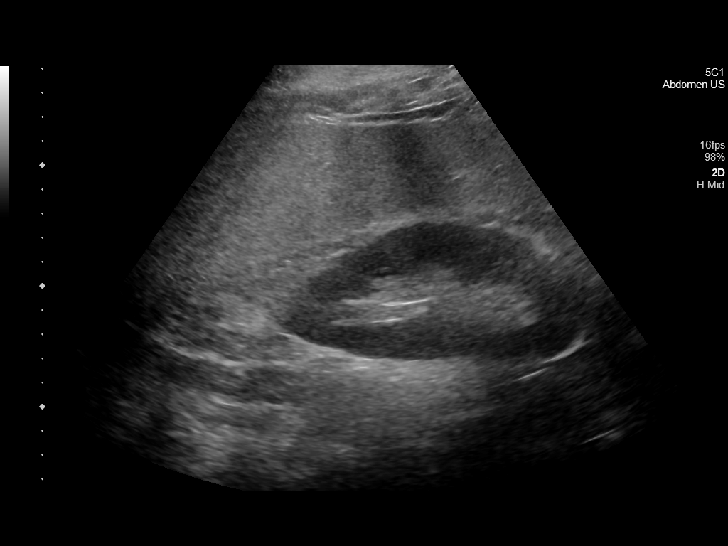

[14 of 25 positions shown; findings below may reference images not displayed]

FINDINGS: Gallbladder:

No gallstones or wall thickening visualized. No sonographic Murphy
sign noted by sonographer.

Common bile duct:

Diameter: 2 mm

Liver:

No focal lesion identified. Markedly increased parenchymal
echogenicity with decreased acoustic penetration. Portal vein is
patent on color Doppler imaging with normal direction of blood flow
towards the liver.

Other: None.
IMPRESSION: The echogenicity of the liver is markedly increased, most commonly
seen with fatty infiltration of the liver. There are no obvious
focal liver lesions.

## 2021-04-30 IMAGING — CT CT ABD-PELV W/O CM
2 of 4 series · 15 of 46 positions shown, 17 images · non-contrast
Comparison: CT abdomen and pelvis [DATE]

CLINICAL DATA: Abdominal pain, acute, nonlocalized. Nausea,
vomiting, and diarrhea.



[Series 2: ap without · axial · non-contrast · 0.82mm/px · z∈[-1088,-658]mm · 12 of 98 slices shown, 14 images]
[im 6/98  soft-tissue]
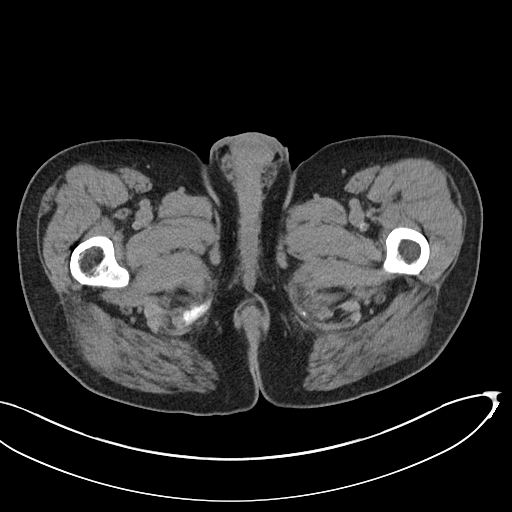
[im 6/98  bone]
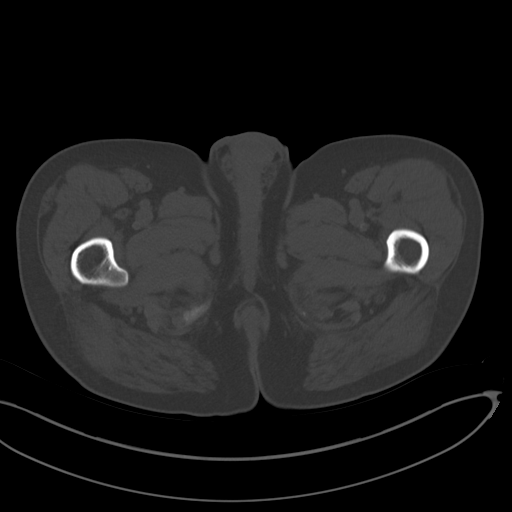
[im 16/98  soft-tissue]
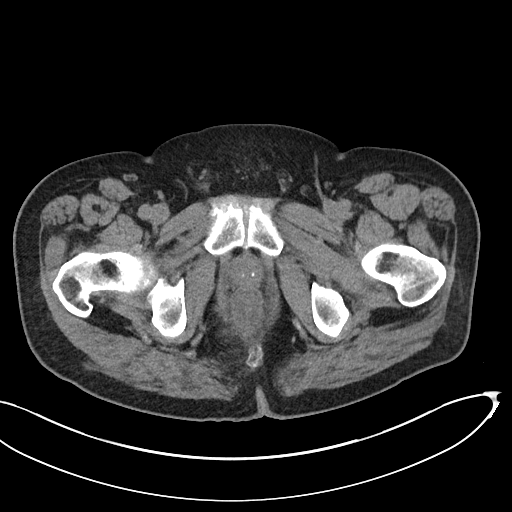
[im 21/98  soft-tissue]
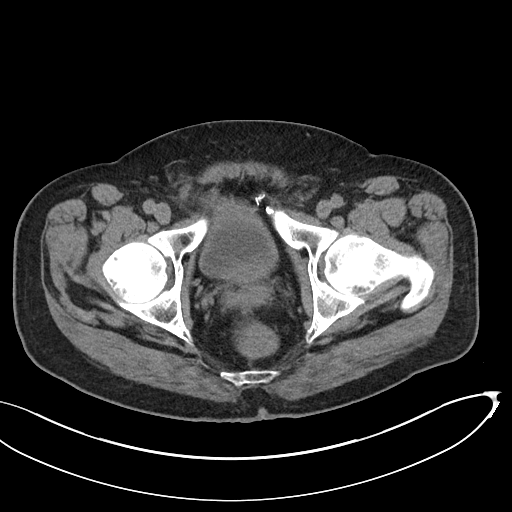
[im 31/98  soft-tissue]
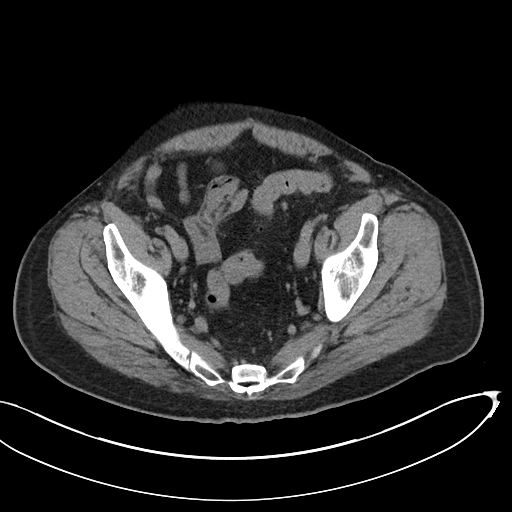
[im 36/98  soft-tissue]
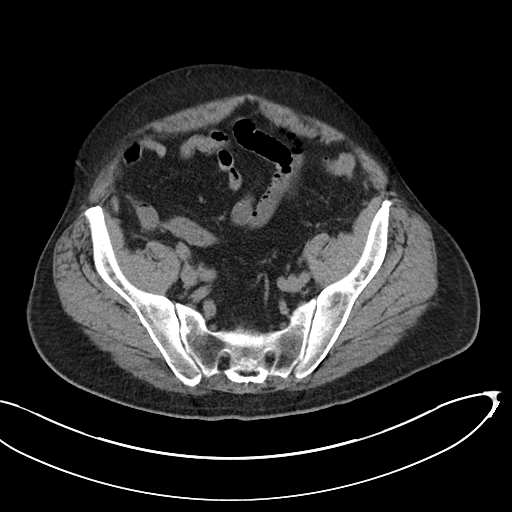
[im 46/98  soft-tissue]
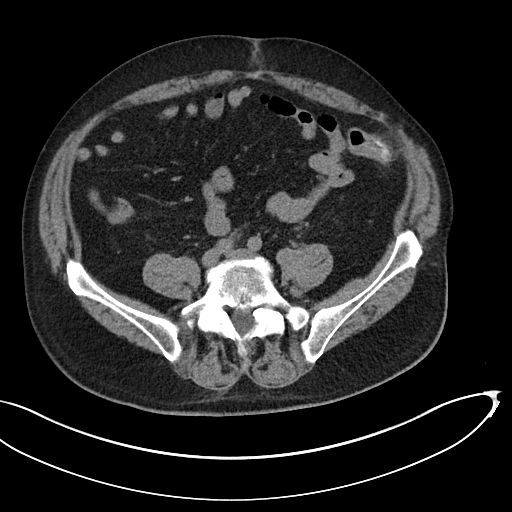
[im 52/98  soft-tissue]
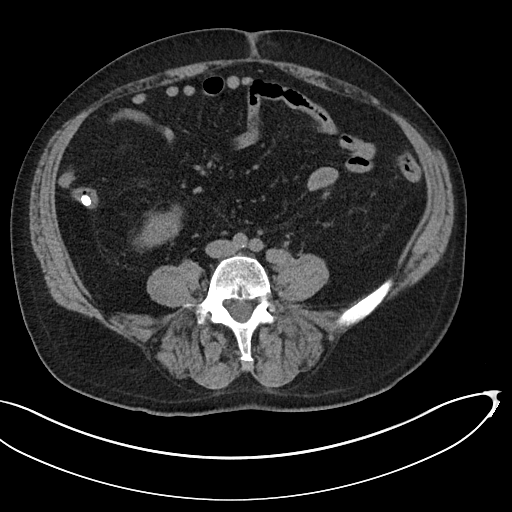
[im 62/98  soft-tissue]
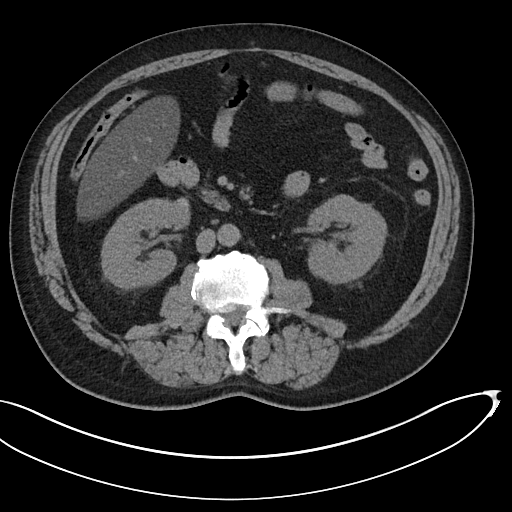
[im 67/98  soft-tissue]
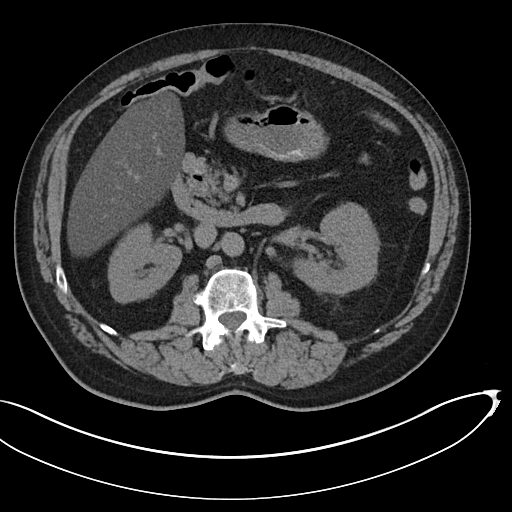
[im 67/98  bone]
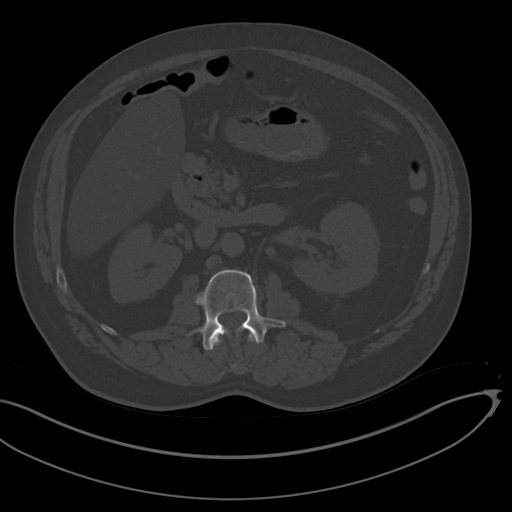
[im 77/98  soft-tissue]
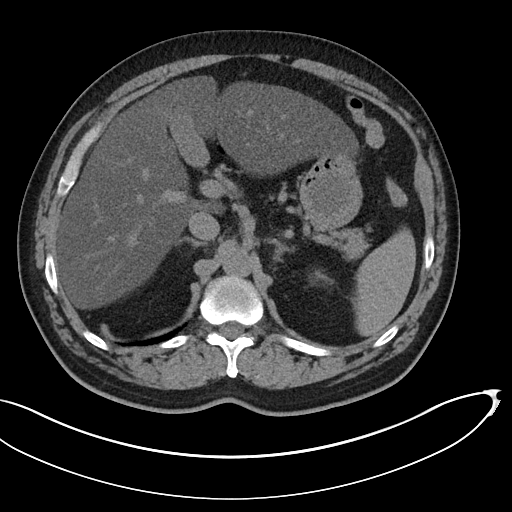
[im 82/98  soft-tissue]
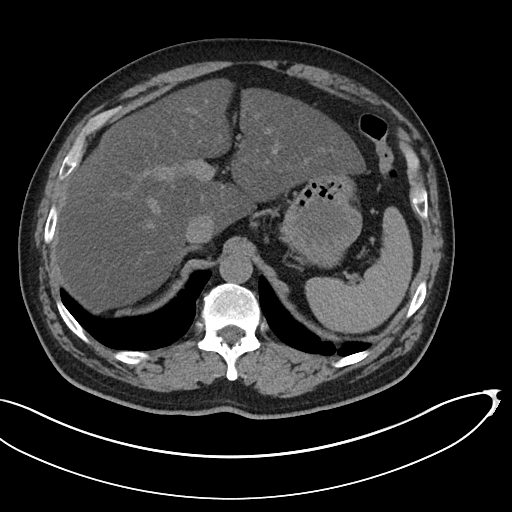
[im 92/98  soft-tissue]
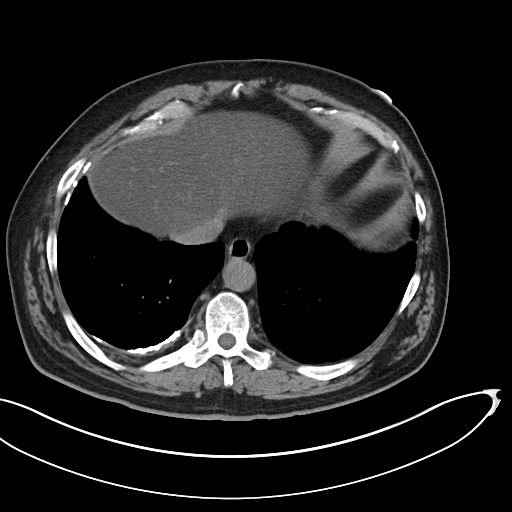

[Series 5: cor · coronal · 0.77mm/px · 3 of 112 slices shown]
[im 38/112  soft-tissue]
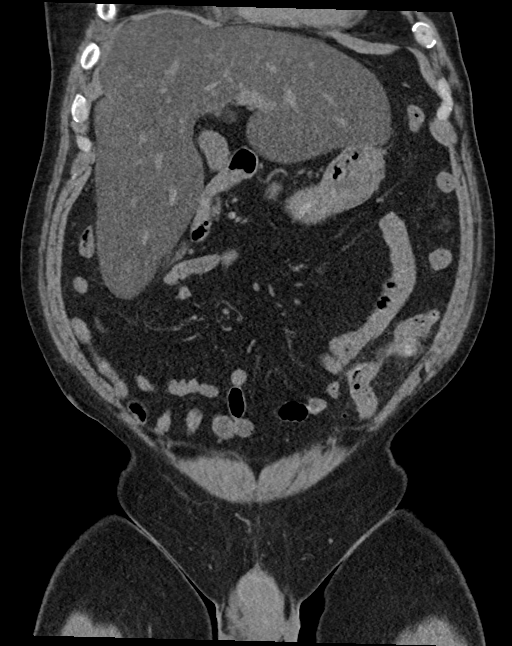
[im 50/112  soft-tissue]
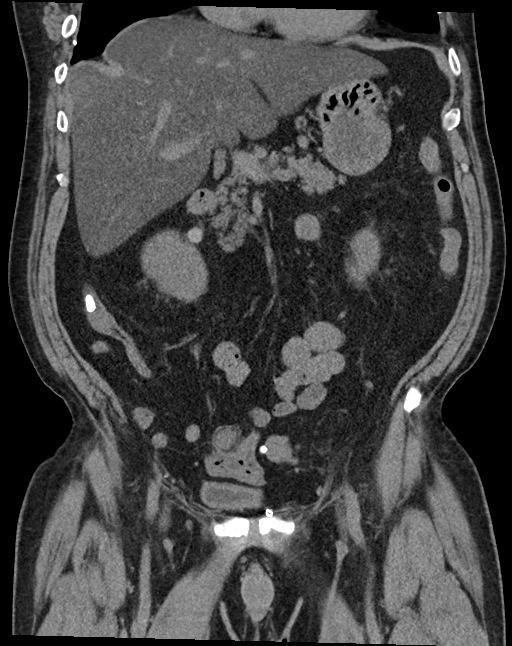
[im 62/112  soft-tissue]
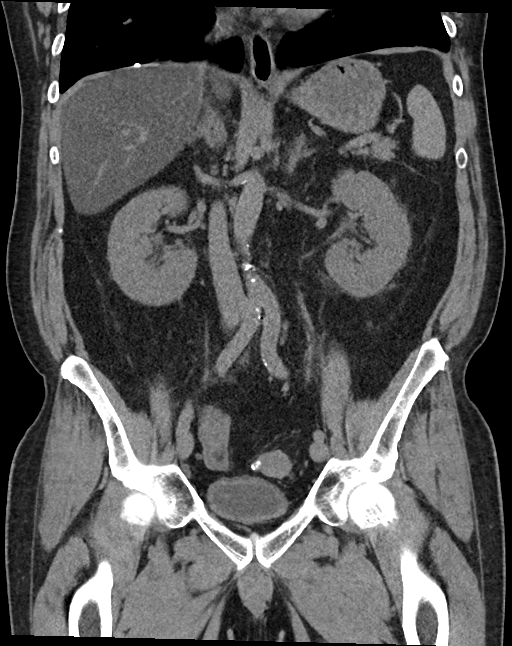

[15 of 46 positions shown; findings below may reference images not displayed]

FINDINGS: Lower chest: Emphysema. Calcified pleural plaques on the right. No
pleural effusion or acute basilar lung consolidation.

Hepatobiliary: Marked hepatic steatosis. Mildly hyperdense fluid in
the gallbladder, possibly concentrated bile. No calcified gallstone,
pericholecystic inflammation, or biliary dilatation.

Pancreas: Unremarkable.

Spleen: Unremarkable.

Adrenals/Urinary Tract: Unremarkable adrenal glands. Unchanged
cm cyst in the upper pole of the left kidney with minimal peripheral
calcification. No renal calculi or hydronephrosis. Unremarkable
bladder.

Stomach/Bowel: The stomach is unremarkable. There is no evidence of
bowel obstruction. Left-sided colonic diverticulosis is noted. There
is new mild focal inflammation along the distal descending colon
with a suspected underlying inflamed diverticula. No extraluminal
gas or fluid collection is present. The appendix is not clearly
identified, however there is no pericecal inflammation.

Vascular/Lymphatic: Abdominal aortic atherosclerosis without
aneurysm. No enlarged lymph nodes.

Reproductive: Unremarkable prostate.

Other: No ascites or pneumoperitoneum. Increased size of a small
fat-containing umbilical hernia. Unchanged small fat-containing left
spigelian hernia. Prior bilateral inguinal hernia repair.

Musculoskeletal: No acute osseous abnormality or suspicious osseous
lesion.
IMPRESSION: 1. Mild inflammation of the distal descending colon compatible with
acute uncomplicated diverticulitis.
2. Marked hepatic steatosis.
3. Fat-containing umbilical and left spigelian hernias.
4. Aortic Atherosclerosis ([34]-[34]) and Emphysema ([34]-[34]).

## 2021-04-30 MED ORDER — TRAMADOL HCL 50 MG PO TABS: 50.0000 mg | ORAL_TABLET | Freq: Three times a day (TID) | ORAL | Status: DC | PRN

## 2021-04-30 MED ORDER — MAGNESIUM SULFATE 2 GM/50ML IV SOLN
2.0000 g | Freq: Once | INTRAVENOUS | Status: AC
  Administered 2021-04-30: 2 g via INTRAVENOUS
  Filled 2021-04-30: qty 50

## 2021-04-30 MED ORDER — ALBUTEROL SULFATE (2.5 MG/3ML) 0.083% IN NEBU: 3.0000 mL | INHALATION_SOLUTION | Freq: Four times a day (QID) | RESPIRATORY_TRACT | Status: DC | PRN

## 2021-04-30 MED ORDER — MORPHINE SULFATE (PF) 2 MG/ML IV SOLN
2.0000 mg | INTRAVENOUS | Status: DC | PRN
  Administered 2021-05-01: 2 mg via INTRAVENOUS
  Filled 2021-04-30: qty 1

## 2021-04-30 MED ORDER — SODIUM CHLORIDE 0.9 % IV BOLUS
1000.0000 mL | Freq: Once | INTRAVENOUS | Status: AC
  Administered 2021-04-30: 1000 mL via INTRAVENOUS

## 2021-04-30 MED ORDER — SODIUM CHLORIDE 0.9 % IV SOLN: INTRAVENOUS | Status: DC

## 2021-04-30 MED ORDER — ONDANSETRON 4 MG PO TBDP
4.0000 mg | ORAL_TABLET | Freq: Once | ORAL | Status: AC | PRN
  Administered 2021-04-30: 4 mg via ORAL
  Filled 2021-04-30: qty 1

## 2021-04-30 MED ORDER — METOPROLOL SUCCINATE ER 25 MG PO TB24
25.0000 mg | ORAL_TABLET | Freq: Every day | ORAL | Status: DC
  Administered 2021-05-01 – 2021-05-04 (×4): 25 mg via ORAL
  Filled 2021-04-30 (×4): qty 1

## 2021-04-30 MED ORDER — ONDANSETRON HCL 4 MG/2ML IJ SOLN
4.0000 mg | Freq: Four times a day (QID) | INTRAMUSCULAR | Status: DC | PRN
Start: 2021-04-30 — End: 2021-05-04

## 2021-04-30 MED ORDER — AMLODIPINE BESYLATE 10 MG PO TABS
10.0000 mg | ORAL_TABLET | Freq: Every day | ORAL | Status: DC
Start: 2021-05-01 — End: 2021-05-01
  Administered 2021-05-01: 10 mg via ORAL
  Filled 2021-04-30: qty 1

## 2021-04-30 MED ORDER — POTASSIUM CHLORIDE 10 MEQ/100ML IV SOLN
10.0000 meq | Freq: Once | INTRAVENOUS | Status: AC
  Administered 2021-04-30: 10 meq via INTRAVENOUS
  Filled 2021-04-30: qty 100

## 2021-04-30 MED ORDER — TRAZODONE HCL 50 MG PO TABS: 50.0000 mg | ORAL_TABLET | Freq: Every evening | ORAL | Status: DC | PRN

## 2021-04-30 MED ORDER — HEPARIN SODIUM (PORCINE) 5000 UNIT/ML IJ SOLN
5000.0000 [IU] | Freq: Three times a day (TID) | INTRAMUSCULAR | Status: DC
  Administered 2021-04-30 – 2021-05-04 (×12): 5000 [IU] via SUBCUTANEOUS
  Filled 2021-04-30 (×12): qty 1

## 2021-04-30 MED ORDER — PANTOPRAZOLE SODIUM 40 MG PO TBEC
40.0000 mg | DELAYED_RELEASE_TABLET | Freq: Every day | ORAL | Status: DC
  Administered 2021-05-01 – 2021-05-04 (×4): 40 mg via ORAL
  Filled 2021-04-30 (×4): qty 1

## 2021-04-30 MED ORDER — SERTRALINE HCL 50 MG PO TABS
50.0000 mg | ORAL_TABLET | Freq: Every day | ORAL | Status: DC
Start: 2021-04-30 — End: 2021-05-04
  Administered 2021-04-30 – 2021-05-04 (×5): 50 mg via ORAL
  Filled 2021-04-30 (×5): qty 1

## 2021-04-30 MED ORDER — ONDANSETRON HCL 4 MG PO TABS: 4.0000 mg | ORAL_TABLET | Freq: Four times a day (QID) | ORAL | Status: DC | PRN

## 2021-04-30 MED ORDER — PIPERACILLIN-TAZOBACTAM 3.375 G IVPB 30 MIN
3.3750 g | Freq: Once | INTRAVENOUS | Status: AC
  Administered 2021-04-30: 3.375 g via INTRAVENOUS
  Filled 2021-04-30: qty 50

## 2021-04-30 MED ORDER — OXYCODONE HCL 5 MG PO TABS
5.0000 mg | ORAL_TABLET | ORAL | Status: DC | PRN
  Administered 2021-04-30 – 2021-05-03 (×12): 5 mg via ORAL
  Filled 2021-04-30 (×12): qty 1

## 2021-04-30 MED ORDER — PIPERACILLIN-TAZOBACTAM 3.375 G IVPB
3.3750 g | Freq: Three times a day (TID) | INTRAVENOUS | Status: DC
  Administered 2021-04-30 – 2021-05-04 (×11): 3.375 g via INTRAVENOUS
  Filled 2021-04-30 (×11): qty 50

## 2021-04-30 NOTE — ED Provider Notes (Signed)
? ?[facility] ?Provider Note ? ? ? Event Date/Time  ? First MD Initiated Contact with Patient 04/30/21 1044   ?  (approximate) ? ? ?History  ? ?Emesis ? ? ?HPI ? ?Jonathan Simon is a 61 y.o. male with history of prior alcohol dependency comes in with concern for nausea vomiting abdominal pain.  Patient reports that he has not used any alcohol since January.  He reports that he has been having nausea vomiting diarrhea for 2 weeks.  He reports lower abdominal discomfort may be some occasional upper.  He denies any chest pain or shortness of breath.  Just reports that he feels weak with trying to ambulate.  He reports 8 pound weight loss and this decreased p.o. intake.  Denies this ever happening previously.  Still has his gallbladder. ? ? ?Physical Exam  ? ?Triage Vital Signs: ?ED Triage Vitals  ?Enc Vitals Group  ?   BP 04/30/21 0939 (!) 146/103  ?   Pulse Rate 04/30/21 0939 83  ?   Resp 04/30/21 0939 20  ?   Temp 04/30/21 0939 98.7 ?F (37.1 ?C)  ?   Temp Source 04/30/21 0939 Oral  ?   SpO2 04/30/21 0939 97 %  ?   Weight 04/30/21 0940 214 lb (97.1 kg)  ?   Height 04/30/21 0940 5\' 10"  (1.778 m)  ?   Head Circumference --   ?   Peak Flow --   ?   Pain Score 04/30/21 0940 7  ?   Pain Loc --   ?   Pain Edu? --   ?   Excl. in [place]? --   ? ? ?Most recent vital signs: ?Vitals:  ? 04/30/21 0939  ?BP: (!) 146/103  ?Pulse: 83  ?Resp: 20  ?Temp: 98.7 ?F (37.1 ?C)  ?SpO2: 97%  ? ? ? ?General: Awake, no distress.  ?CV:  Good peripheral perfusion.  ?Resp:  Normal effort.  ?Abd:  No distention. Tender lower abdomen ?Other:  No swelling  ? ? ?ED Results / Procedures / Treatments  ? ?Labs ?(all labs ordered are listed, but only abnormal results are displayed) ?Labs Reviewed  ?LIPASE, BLOOD - Abnormal; Notable for the following components:  ?    Result Value  ? Lipase 59 (*)   ? All other components within normal limits  ?COMPREHENSIVE METABOLIC PANEL - Abnormal; Notable for the following components:  ? Sodium  126 (*)   ? Potassium 3.1 (*)   ? Chloride 81 (*)   ? CO2 20 (*)   ? Glucose, Bld 127 (*)   ? BUN 35 (*)   ? Creatinine, Ser 3.62 (*)   ? Calcium 7.4 (*)   ? AST 397 (*)   ? ALT 294 (*)   ? Total Bilirubin 3.9 (*)   ? GFR, Estimated 18 (*)   ? Anion gap 25 (*)   ? All other components within normal limits  ?CBC - Abnormal; Notable for the following components:  ? MCHC 37.0 (*)   ? Platelets 124 (*)   ? All other components within normal limits  ?URINALYSIS, ROUTINE W REFLEX MICROSCOPIC - Abnormal; Notable for the following components:  ? Color, Urine YELLOW (*)   ? APPearance HAZY (*)   ? Ketones, ur 5 (*)   ? All other components within normal limits  ? ? ? ?EKG ? ?My interpretation of EKG: ?Normal sinus rate of 71, no ST elevation, no T wave inversions, incomplete right bundle  branch block ? ? ?RADIOLOGY ?I reviewed the CT personally and see diverticulitis ? ? ?PROCEDURES: ? ?Critical Care performed: No ? ?Procedures ? ? ?MEDICATIONS ORDERED IN ED: ?Medications  ?piperacillin-tazobactam (ZOSYN) IVPB 3.375 g (3.375 g Intravenous New Bag/Given 04/30/21 1320)  ?ondansetron (ZOFRAN-ODT) disintegrating tablet 4 mg (4 mg Oral Given 04/30/21 0947)  ?sodium chloride 0.9 % bolus 1,000 mL (1,000 mLs Intravenous New Bag/Given 04/30/21 1132)  ?magnesium sulfate IVPB 2 g 50 mL (0 g Intravenous Stopped 04/30/21 1252)  ?potassium chloride 10 mEq in 100 mL IVPB (0 mEq Intravenous Stopped 04/30/21 1252)  ? ? ? ?IMPRESSION / MDM / ASSESSMENT AND PLAN / ED COURSE  ?I reviewed the triage vital signs and the nursing notes. ?             ?               ? ?Differential diagnosis includes, but is not limited to, abdominal pathology such as diverticulitis, SBO, appendicitis.  Also consider gallbladder pathology given elevated LFTs.  Will get imaging to further evaluate. ? ?Labs show significant dehydration with low potassium and low magnesium.  We will start some IV repletion.  His creatinine is significantly elevated.  Suspect this is from  dehydration.  His LFTs are also significantly elevated but do not seem consistent with a obstructive pattern however ultrasound ordered to make sure no evidence of gallstones to need ERCP.  Discussed with Dr. Marius Ditch who will consult on.  ? ?CT scan concerning for diverticulitis.  Will start on Zosyn.  Will discuss with hospital team for admission.  We discussed possible rhabdo and he will add on a CK. ? ? ? ? ? ? ?FINAL CLINICAL IMPRESSION(S) / ED DIAGNOSES  ? ?Final diagnoses:  ?Abnormal LFTs  ?Diverticulitis  ?AKI (acute kidney injury) ([place])  ?Hypomagnesemia  ? ? ? ?Rx / DC Orders  ? ?ED Discharge Orders   ? ? None  ? ?  ? ? ? ?Note:  This document was prepared using Dragon voice recognition software and may include unintentional dictation errors. ?  ?Vanessa Mildred, MD ?04/30/21 1343 ? ?

## 2021-04-30 NOTE — ED Notes (Signed)
Critical Result: magnesium 0.9 ? ?Jari Pigg, MD aware ?

## 2021-04-30 NOTE — ED Notes (Signed)
Pt with c/o lower abdominal pain below naval.  ?

## 2021-04-30 NOTE — ED Triage Notes (Signed)
Pt with N/V/D since  Tuesday 2/21. Pt states he has been unable to keep any thing down since this time and has no appetite. Pt states he has lost about 8 lbs.  ?

## 2021-04-30 NOTE — ED Notes (Signed)
Pt c/o weakness after walking from bathroom, EKG done. Pt states he has felt weak for the past few days.  ?

## 2021-04-30 NOTE — Progress Notes (Signed)
Pharmacy Antibiotic Note ? ?Jonathan Simon is a 61 y.o. male admitted on 04/30/2021 with diverticulitis.  Pharmacy has been consulted for Zosyn dosing. ? ?Plan: ?Zosyn 3.375g IV q8h (4 hour infusion). ? ?Height: 5\' 10"  (177.8 cm) ?Weight: 97.1 kg (214 lb) ?IBW/kg (Calculated) : 73 ? ?Temp (24hrs), Avg:98.7 ?F (37.1 ?C), Min:98.7 ?F (37.1 ?C), Max:98.7 ?F (37.1 ?C) ? ?Recent Labs  ?Lab 04/30/21 ?0945  ?WBC 6.0  ?CREATININE 3.62*  ?  ?Estimated Creatinine Clearance: 24.7 mL/min (A) (by C-G formula based on SCr of 3.62 mg/dL (H)).   ? ?Allergies  ?Allergen Reactions  ? Latex Rash  ? ? ?Antimicrobials this admission: ?3/2 Zosyn >>  ? ? ?Thank you for allowing pharmacy to be a part of this patient?s care. ? ?Forde Dandy Cranford Blessinger ?04/30/2021 2:18 PM ? ?
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
## 2021-04-30 NOTE — ED Notes (Signed)
Transport requested
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
## 2021-05-01 DIAGNOSIS — R7989 Other specified abnormal findings of blood chemistry: Secondary | ICD-10-CM | POA: Diagnosis not present

## 2021-05-01 DIAGNOSIS — N179 Acute kidney failure, unspecified: Secondary | ICD-10-CM

## 2021-05-01 DIAGNOSIS — K7030 Alcoholic cirrhosis of liver without ascites: Secondary | ICD-10-CM

## 2021-05-01 DIAGNOSIS — K5792 Diverticulitis of intestine, part unspecified, without perforation or abscess without bleeding: Secondary | ICD-10-CM

## 2021-05-01 DIAGNOSIS — E876 Hypokalemia: Secondary | ICD-10-CM

## 2021-05-01 LAB — CBC
HCT: 38 % — ABNORMAL LOW (ref 39.0–52.0)
Hemoglobin: 13.8 g/dL (ref 13.0–17.0)
MCH: 33.7 pg (ref 26.0–34.0)
MCHC: 36.3 g/dL — ABNORMAL HIGH (ref 30.0–36.0)
MCV: 92.9 fL (ref 80.0–100.0)
Platelets: 101 10*3/uL — ABNORMAL LOW (ref 150–400)
RBC: 4.09 MIL/uL — ABNORMAL LOW (ref 4.22–5.81)
RDW: 14.3 % (ref 11.5–15.5)
WBC: 3.8 10*3/uL — ABNORMAL LOW (ref 4.0–10.5)
nRBC: 0 % (ref 0.0–0.2)

## 2021-05-01 LAB — COMPREHENSIVE METABOLIC PANEL
ALT: 226 U/L — ABNORMAL HIGH (ref 0–44)
AST: 285 U/L — ABNORMAL HIGH (ref 15–41)
Albumin: 3.9 g/dL (ref 3.5–5.0)
Alkaline Phosphatase: 109 U/L (ref 38–126)
Anion gap: 15 (ref 5–15)
BUN: 35 mg/dL — ABNORMAL HIGH (ref 8–23)
CO2: 29 mmol/L (ref 22–32)
Calcium: 7.2 mg/dL — ABNORMAL LOW (ref 8.9–10.3)
Chloride: 88 mmol/L — ABNORMAL LOW (ref 98–111)
Creatinine, Ser: 2.9 mg/dL — ABNORMAL HIGH (ref 0.61–1.24)
GFR, Estimated: 24 mL/min — ABNORMAL LOW (ref >60)
Glucose, Bld: 103 mg/dL — ABNORMAL HIGH (ref 70–99)
Potassium: 2.9 mmol/L — ABNORMAL LOW (ref 3.5–5.1)
Sodium: 132 mmol/L — ABNORMAL LOW (ref 135–145)
Total Bilirubin: 3.4 mg/dL — ABNORMAL HIGH (ref 0.3–1.2)
Total Protein: 7.4 g/dL (ref 6.5–8.1)

## 2021-05-01 LAB — APTT: aPTT: 32 seconds (ref 24–36)

## 2021-05-01 LAB — HAPTOGLOBIN: Haptoglobin: 136 mg/dL (ref 32–363)

## 2021-05-01 LAB — HEPATITIS PANEL, ACUTE
HCV Ab: NONREACTIVE
Hep A IgM: NONREACTIVE
Hep B C IgM: UNDETERMINED — AB
Hepatitis B Surface Ag: NONREACTIVE

## 2021-05-01 LAB — PROTIME-INR
INR: 1 (ref 0.8–1.2)
Prothrombin Time: 12.9 seconds (ref 11.4–15.2)

## 2021-05-01 LAB — HIV ANTIBODY (ROUTINE TESTING W REFLEX): HIV Screen 4th Generation wRfx: NONREACTIVE

## 2021-05-01 LAB — MAGNESIUM: Magnesium: 1.7 mg/dL (ref 1.7–2.4)

## 2021-05-01 MED ORDER — LACTATED RINGERS IV SOLN
INTRAVENOUS | Status: DC
  Filled 2021-05-01 (×2): qty 1000

## 2021-05-01 MED ORDER — POTASSIUM CHLORIDE CRYS ER 20 MEQ PO TBCR
40.0000 meq | EXTENDED_RELEASE_TABLET | Freq: Two times a day (BID) | ORAL | Status: AC
  Administered 2021-05-01 (×2): 40 meq via ORAL
  Filled 2021-05-01 (×2): qty 2

## 2021-05-01 MED ORDER — SPIRONOLACTONE 25 MG PO TABS
25.0000 mg | ORAL_TABLET | Freq: Every day | ORAL | Status: DC
  Administered 2021-05-01 – 2021-05-03 (×3): 25 mg via ORAL
  Filled 2021-05-01 (×3): qty 1

## 2021-05-01 MED ORDER — CALCIUM GLUCONATE-NACL 2-0.675 GM/100ML-% IV SOLN
2.0000 g | Freq: Once | INTRAVENOUS | Status: AC
  Administered 2021-05-01: 2000 mg via INTRAVENOUS
  Filled 2021-05-01: qty 100

## 2021-05-01 MED ORDER — KCL-LACTATED RINGERS 20 MEQ/L IV SOLN
INTRAVENOUS | Status: DC
  Filled 2021-05-01 (×2): qty 1000

## 2021-05-01 NOTE — Progress Notes (Signed)
?PROGRESS NOTE ? ?[name]    DOB: [birth year], 61 y.o.  ?HYI:[id number]  ?  Code Status: Full Code   ?DOA: 04/30/2021   LOS: 1  ? ?Brief hospital course  ?Shaquill TUNIS GENTLE is a 61 y.o. male with a PMH significant for alcohol dependence in remission since 03/2021, HTN, HLD, depression/anxiety, GERD, obesity, steatohepatitis. ?They presented from home to the ED on 04/30/2021 with N/V/D/abdominal pain x 14 days. Denied blood in emesis or stool.  ?In the ED, it was found that they had several electrolyte abnormalities including hyponatremia, hypokalemia, hypochloremia, hypomagnesemia. Also positive for diverticulitis on CT abdomen/pelvis. RUQ [place] confirmed fatty liver disease.   ?They were treated with electrolyte replacement and supportive care. He was started on IV zosyn in treatment of the diverticulitis. GI was consulted for further evaluation. ?Patient was admitted to medicine service for further workup and management of diverticulitis as outlined in detail below. ? ?05/01/21 -stable, improved. ? ?Assessment & Plan  ?Principal Problem: ?  Diverticulitis ? ?Acute Diverticulitis- Pain is improved ?- GI following, appreciate recs ?- continue zosyn ?- stop IV fluids today ?- advance diet as tolerated ?- zofran PRN ? ?AKI (improving)  hypokalemia- K+ 3.3>2.9. Mg++ 1.7 today s/p magnesium replacement. Cr 3.62>>2.90 ?- CMP am, Mg++ am ?- electrolyte replacement PRN  ? ?Compensated liver cirrhosis  Thrombocytopenia- no signs of acute bleeding. INR 1.0 ?- CBC, CMP am ?- f/u hepatic panel ?- PT/OT ? ?HTN-  ?- discontinue home amlodipine as there are antihypertensives with secondary benefits to use first ?- continue metoprolol ?- initiate spironolactone which will increase K+ and benefit in liver disease ? ?GERD ?- continue home prilosec ? ?Depression/anxiety-  ?- continue home zoloft, trazodone ? ?Body mass index is 30.71 kg/m?. ? ?VTE ppx: heparin injection 5,000 Units Start: 04/30/21 1400 ? ?Diet:  ?   ?Diet  ? Diet  NPO time specified Except for: Sips with Meds  ? ?Subjective 05/01/21   ? ?Pt reports improvement in pain, N, V. He denies any complaints at this time. ?  ?Objective  ? ?Vitals:  ? 04/30/21 1545 04/30/21 2000 04/30/21 2313 05/01/21 0427  ?BP:  (!) 150/97 (!) 142/94 (!) 143/95  ?Pulse: 84 75 71 71  ?Resp:  20 20 20   ?Temp:  99 ?F (37.2 ?C) 98.9 ?F (37.2 ?C) 98.7 ?F (37.1 ?C)  ?TempSrc:      ?SpO2: 95% 99% 98% 98%  ?Weight:      ?Height:      ? ? ?Intake/Output Summary (Last 24 hours) at 05/01/2021 0815 ?Last data filed at 05/01/2021 0600 ?Gross per 24 hour  ?Intake 826.31 ml  ?Output 450 ml  ?Net 376.31 ml  ? ?Filed Weights  ? 04/30/21 0940  ?Weight: 97.1 kg  ?  ? ?Physical Exam:  ?General: awake, alert, NAD ?HEENT: atraumatic, clear conjunctiva, anicteric sclera, MMM, hearing grossly normal ?Respiratory: normal respiratory effort. ?Cardiovascular: quick capillary refill, normal S1/S2, RRR, no JVD, murmurs ?Gastrointestinal: soft, NT, ND ?Nervous: A&O x3. no gross focal neurologic deficits, normal speech ?Extremities: moves all equally, no edema, normal tone ?Skin: dry, intact, normal temperature, normal color. No rashes, lesions or ulcers on exposed skin ?Psychiatry: normal mood, congruent affect ? ?Labs   ?I have personally reviewed the following labs and imaging studies ?CBC ?   ?Component Value Date/Time  ? WBC 3.8 (L) 05/01/2021 0548  ? RBC 4.09 (L) 05/01/2021 0548  ? HGB 13.8 05/01/2021 0548  ? HCT 38.0 (L) 05/01/2021 0548  ? PLT  101 (L) 05/01/2021 0548  ? MCV 92.9 05/01/2021 0548  ? MCH 33.7 05/01/2021 0548  ? MCHC 36.3 (H) 05/01/2021 0548  ? RDW 14.3 05/01/2021 0548  ? LYMPHSABS 1.0 10/01/2020 0851  ? MONOABS 0.6 10/01/2020 0851  ? EOSABS 0.0 10/01/2020 0851  ? BASOSABS 0.0 10/01/2020 0851  ? ?BMP Latest Ref Rng & Units 05/01/2021 04/30/2021 04/30/2021  ?Glucose 70 - 99 mg/dL 103(H) 116(H) 127(H)  ?BUN 8 - 23 mg/dL 35(H) 35(H) 35(H)  ?Creatinine 0.61 - 1.24 mg/dL 2.90(H) 3.47(H) 3.62(H)  ?Sodium 135 - 145 mmol/L 132(L)  127(L) 126(L)  ?Potassium 3.5 - 5.1 mmol/L 2.9(L) 3.3(L) 3.1(L)  ?Chloride 98 - 111 mmol/L 88(L) 83(L) 81(L)  ?CO2 22 - 32 mmol/L 29 24 20(L)  ?Calcium 8.9 - 10.3 mg/dL 7.2(L) 7.0(L) 7.4(L)  ? ? ?CT ABDOMEN PELVIS WO CONTRAST ? ?Result Date: 04/30/2021 ?CLINICAL DATA:  Abdominal pain, acute, nonlocalized. Nausea, vomiting, and diarrhea. EXAM: CT ABDOMEN AND PELVIS WITHOUT CONTRAST TECHNIQUE: Multidetector CT imaging of the abdomen and pelvis was performed following the standard protocol without IV contrast. RADIATION DOSE REDUCTION: This exam was performed according to the departmental dose-optimization program which includes automated exposure control, adjustment of the mA and/or kV according to patient size and/or use of iterative reconstruction technique. COMPARISON:  CT abdomen and pelvis 09/14/2019 FINDINGS: Lower chest: Emphysema. Calcified pleural plaques on the right. No pleural effusion or acute basilar lung consolidation. Hepatobiliary: Marked hepatic steatosis. Mildly hyperdense fluid in the gallbladder, possibly concentrated bile. No calcified gallstone, pericholecystic inflammation, or biliary dilatation. Pancreas: Unremarkable. Spleen: Unremarkable. Adrenals/Urinary Tract: Unremarkable adrenal glands. Unchanged 1.5 cm cyst in the upper pole of the left kidney with minimal peripheral calcification. No renal calculi or hydronephrosis. Unremarkable bladder. Stomach/Bowel: The stomach is unremarkable. There is no evidence of bowel obstruction. Left-sided colonic diverticulosis is noted. There is new mild focal inflammation along the distal descending colon with a suspected underlying inflamed diverticula. No extraluminal gas or fluid collection is present. The appendix is not clearly identified, however there is no pericecal inflammation. Vascular/Lymphatic: Abdominal aortic atherosclerosis without aneurysm. No enlarged lymph nodes. Reproductive: Unremarkable prostate. Other: No ascites or pneumoperitoneum.  Increased size of a small fat-containing umbilical hernia. Unchanged small fat-containing left spigelian hernia. Prior bilateral inguinal hernia repair. Musculoskeletal: No acute osseous abnormality or suspicious osseous lesion. IMPRESSION: 1. Mild inflammation of the distal descending colon compatible with acute uncomplicated diverticulitis. 2. Marked hepatic steatosis. 3. Fat-containing umbilical and left spigelian hernias. 4. Aortic Atherosclerosis (ICD10-I70.0) and Emphysema (ICD10-J43.9). Electronically Signed   By: Logan Bores M.D.   On: 04/30/2021 11:36  ? ?US ABDOMEN LIMITED RUQ (LIVER/GB) ? ?Result Date: 04/30/2021 ?CLINICAL DATA:  Abnormal LFTs EXAM: ULTRASOUND ABDOMEN LIMITED RIGHT UPPER QUADRANT COMPARISON:  Same day CT of the abdomen pelvis. FINDINGS: Gallbladder: No gallstones or wall thickening visualized. No sonographic Murphy sign noted by sonographer. Common bile duct: Diameter: 2 mm Liver: No focal lesion identified. Markedly increased parenchymal echogenicity with decreased acoustic penetration. Portal vein is patent on color Doppler imaging with normal direction of blood flow towards the liver. Other: None. IMPRESSION: The echogenicity of the liver is markedly increased, most commonly seen with fatty infiltration of the liver. There are no obvious focal liver lesions. Electronically Signed   By: Dahlia Bailiff M.D.   On: 04/30/2021 12:15   ? ?Disposition Plan & Communication  ?Patient status: Inpatient  ?Admitted From: Home ?Planned disposition location: Home ?Anticipated discharge date: 3/6 pending clinical improvement and electrolyte stabilization ? ?Family Communication:  none  ?  ?Author: ?Richarda Osmond, DO ?[facility] ?05/01/2021, 8:15 AM  ? ?Available by Epic secure chat 7AM-7PM. ?If 7PM-7AM, please contact night-coverage.  ?[facility] contact information found on [email or web address] ? ?

## 2021-05-01 NOTE — Evaluation (Signed)
Physical Therapy Evaluation ?Patient Details ?Name: Jonathan Simon ?MRN: [id number] ?DOB: 1960/10/12 ?Today's Date: 05/01/2021 ? ?History of Present Illness ? Pt is a 61 y.o. male with a past medical history of alcohol dependence (sober since January), essential hypertension, dyslipidemia, depression/anxiety, GERD, and obesity. The patient presented to the ER following two weeks of nausea, vomiting, diarrhea, and abdominal pain.  MD assessment includes: Acute diverticulitis, AKI, hypokalemia, compensated liver cirrhosis, and thrombocytopenia. ?  ?Clinical Impression ? Pt was pleasant and motivated to participate during the session and put forth good effort throughout. Pt required no physical assistance during the session and was able to transfer and ambulate with good control and stability without an AD.  Pt did endorse general weakness compared to his baseline and deferred assessing stair navigation at this time.  Will keep pt on PT caseload while in acute care to address general weakness but it is unlikely that pt will require continued skilled PT services once medically cleared for discharge.  Will continue to follow patient and will update discharge recommendations if appropriate.  ?   ?   ? ?Recommendations for follow up therapy are one component of a multi-disciplinary discharge planning process, led by the attending physician.  Recommendations may be updated based on patient status, additional functional criteria and insurance authorization. ? ?Follow Up Recommendations No PT follow up ? ?  ?Assistance Recommended at Discharge None  ?Patient can return home with the following ? Assist for transportation ? ?  ?Equipment Recommendations None recommended by PT  ?Recommendations for Other Services ?    ?  ?Functional Status Assessment Patient has had a recent decline in their functional status and demonstrates the ability to make significant improvements in function in a reasonable and predictable amount of time.   ? ?  ?Precautions / Restrictions Precautions ?Precautions: None ?Restrictions ?Weight Bearing Restrictions: No ?Other Position/Activity Restrictions: Seizure precautions listed in orders but no history of seizures noted in chart in pt's PMH; per nursing ok for patient to be in recliner  ? ?  ? ?Mobility ? Bed Mobility ?Overal bed mobility: Modified Independent ?  ?  ?  ?  ?  ?  ?General bed mobility comments: Min extra time and effort only ?  ? ?Transfers ?Overall transfer level: Needs assistance ?Equipment used: None ?Transfers: Sit to/from Stand ?Sit to Stand: Supervision ?  ?  ?  ?  ?  ?General transfer comment: Good eccentric and concentric control and stability ?  ? ?Ambulation/Gait ?Ambulation/Gait assistance: Supervision ?Gait Distance (Feet): 150 Feet ?Assistive device: None ?Gait Pattern/deviations: Step-through pattern ?Gait velocity: decreased ?  ?  ?General Gait Details: Min decreased cadence but steady without LOB or adverse symptoms; pt declined to attempt stair assessment this date secondary to general weakness ? ?Stairs ?  ?  ?  ?  ?  ? ?Wheelchair Mobility ?  ? ?Modified Rankin (Stroke Patients Only) ?  ? ?  ? ?Balance Overall balance assessment: No apparent balance deficits (not formally assessed) ?  ?  ?  ?  ?  ?  ?  ?  ?  ?  ?  ?  ?  ?  ?  ?  ?  ?  ?   ? ? ? ?Pertinent Vitals/Pain Pain Assessment ?Pain Assessment: 0-10 ?Pain Score: 6  ?Pain Location: abdominal area ?Pain Descriptors / Indicators: Aching ?Pain Intervention(s): Premedicated before session, Monitored during session, Repositioned  ? ? ?Home Living Family/patient expects to be discharged to:: Private residence ?Living Arrangements:  Alone ?Available Help at Discharge: Family;Available PRN/intermittently ?Type of Home: House ?Home Access: Stairs to enter ?Entrance Stairs-Rails: None ?Entrance Stairs-Number of Steps: 1 ?  ?Home Layout: One level ?Home Equipment: Conservation officer, nature (2 wheels);Shower seat - built in ?   ?  ?Prior Function  Prior Level of Function : Independent/Modified Independent ?  ?  ?  ?  ?  ?  ?Mobility Comments: Ind amb community distances without an AD, no fall history ?ADLs Comments: Ind with ADLs ?  ? ? ?Hand Dominance  ?   ? ?  ?Extremity/Trunk Assessment  ? Upper Extremity Assessment ?Upper Extremity Assessment: Overall WFL for tasks assessed ?  ? ?Lower Extremity Assessment ?Lower Extremity Assessment: Generalized weakness ?  ? ?   ?Communication  ? Communication: No difficulties  ?Cognition Arousal/Alertness: Awake/alert ?Behavior During Therapy: [facility] for tasks assessed/performed ?Overall Cognitive Status: Within Functional Limits for tasks assessed ?  ?  ?  ?  ?  ?  ?  ?  ?  ?  ?  ?  ?  ?  ?  ?  ?  ?  ?  ? ?  ?General Comments   ? ?  ?Exercises Total Joint Exercises ?Ankle Circles/Pumps: AROM, Strengthening, Both, 10 reps ?Quad Sets: 10 reps, Both, Strengthening ?Gluteal Sets: Strengthening, Both, 10 reps ?Long [employer]: Strengthening, Both, 10 reps ?Knee Flexion: Strengthening, Both, 10 reps ?Other Exercises ?Other Exercises: Pt education on HEP for BLE APs, QS, GS, and LAQs x 10 each every 1-2 hours daily  ? ?Assessment/Plan  ?  ?PT Assessment Patient needs continued PT services  ?PT Problem List Decreased strength;Decreased activity tolerance ? ?   ?  ?PT Treatment Interventions Gait training;Stair training;Functional mobility training;Therapeutic activities;Therapeutic exercise;Patient/family education   ? ?PT Goals (Current goals can be found in the Care Plan section)  ?Acute Rehab PT Goals ?Patient Stated Goal: To get rid of the pain ?PT Goal Formulation: With patient ?Time For Goal Achievement: 05/14/21 ?Potential to Achieve Goals: Good ? ?  ?Frequency Min 2X/week ?  ? ? ?Co-evaluation   ?  ?  ?  ?  ? ? ?  ?AM-PAC PT "6 Clicks" Mobility  ?Outcome Measure Help needed turning from your back to your side while in a flat bed without using bedrails?: None ?Help needed moving from lying on your back to sitting on the  side of a flat bed without using bedrails?: None ?Help needed moving to and from a bed to a chair (including a wheelchair)?: A Little ?Help needed standing up from a chair using your arms (e.g., wheelchair or bedside chair)?: A Little ?Help needed to walk in hospital room?: A Little ?Help needed climbing 3-5 steps with a railing? : A Little ?6 Click Score: 20 ? ?  ?End of Session Equipment Utilized During Treatment: Gait belt ?Activity Tolerance: Patient tolerated treatment well ?Patient left: in chair;with call bell/phone within reach ?Nurse Communication: Mobility status ?PT Visit Diagnosis: Difficulty in walking, not elsewhere classified (R26.2);Muscle weakness (generalized) (M62.81) ?  ? ?Time: 6213-0865 ?PT Time Calculation (min) (ACUTE ONLY): 29 min ? ? ?Charges:   PT Evaluation ?$PT Eval Low Complexity: 1 Low ?PT Treatments ?$Therapeutic Exercise: 8-22 mins ?  ?   ? ? ?D. Royetta Asal PT, DPT ?05/01/21, 4:25 PM ? ? ?

## 2021-05-01 NOTE — Progress Notes (Signed)
Jonathan Darby, MD [address]  [place]  [place], [place] [address]  Main: [phone]  Fax: [phone] Pager: [phone]   Subjective: No acute events overnight.  Patient reports mild abdominal discomfort, had 1 round, soft bowel movement yesterday.  He denies any fever, chills, nausea or vomiting.   Objective: Vital signs in last 24 hours: Vitals:   04/30/21 2000 04/30/21 2313 05/01/21 0427 05/01/21 1019  BP: (!) 150/97 (!) 142/94 (!) 143/95 (!) 141/91  Pulse: 75 71 71 64  Resp: 20 20 20 16   Temp: 99 F (37.2 C) 98.9 F (37.2 C) 98.7 F (37.1 C) 98 F (36.7 C)  TempSrc:      SpO2: 99% 98% 98% 96%  Weight:      Height:       Weight change:   Intake/Output Summary (Last 24 hours) at 05/01/2021 1149 Last data filed at 05/01/2021 0808 Gross per 24 hour  Intake 826.31 ml  Output 675 ml  Net 151.31 ml     Exam: Heart:: Regular rate and rhythm, S1S2 present, or without murmur or extra heart sounds Lungs: normal and clear to auscultation Abdomen: soft, nontender, normal bowel sounds   Lab Results: CBC Latest Ref Rng & Units 05/01/2021 04/30/2021 12/29/2020  WBC 4.0 - 10.5 K/uL 3.8(L) 6.0 9.7  Hemoglobin 13.0 - 17.0 g/dL 13.8 14.7 17.1(H)  Hematocrit 39.0 - 52.0 % 38.0(L) 39.7 47.2  Platelets 150 - 400 K/uL 101(L) 124(L) 282   CMP Latest Ref Rng & Units 05/01/2021 04/30/2021 04/30/2021  Glucose 70 - 99 mg/dL 103(H) 116(H) -  BUN 8 - 23 mg/dL 35(H) 35(H) -  Creatinine 0.61 - 1.24 mg/dL 2.90(H) 3.47(H) -  Sodium 135 - 145 mmol/L 132(L) 127(L) -  Potassium 3.5 - 5.1 mmol/L 2.9(L) 3.3(L) -  Chloride 98 - 111 mmol/L 88(L) 83(L) -  CO2 22 - 32 mmol/L 29 24 -  Calcium 8.9 - 10.3 mg/dL 7.2(L) 7.0(L) -  Total Protein 6.5 - 8.1 g/dL 7.4 - 7.8  Total Bilirubin 0.3 - 1.2 mg/dL 3.4(H) - 4.1(H)  Alkaline Phos 38 - 126 U/L 109 - 121  AST 15 - 41 U/L 285(H) - 414(H)  ALT 0 - 44 U/L 226(H) - 296(H)    Micro Results: Recent Results (from the past 240 hour(s))  Resp  Panel by RT-PCR (Flu A&B, Covid) Nasopharyngeal Swab     Status: None   Collection Time: 04/30/21 11:05 AM   Specimen: Nasopharyngeal Swab; Nasopharyngeal(NP) swabs in vial transport medium  Result Value Ref Range Status   SARS Coronavirus 2 by RT PCR NEGATIVE NEGATIVE Final    Comment: (NOTE) SARS-CoV-2 target nucleic acids are NOT DETECTED.  The SARS-CoV-2 RNA is generally detectable in upper respiratory specimens during the acute phase of infection. The lowest concentration of SARS-CoV-2 viral copies this assay can detect is 138 copies/mL. A negative result does not preclude SARS-Cov-2 infection and should not be used as the sole basis for treatment or other patient management decisions. A negative result may occur with  improper specimen collection/handling, submission of specimen other than nasopharyngeal swab, presence of viral mutation(s) within the areas targeted by this assay, and inadequate number of viral copies(<138 copies/mL). A negative result must be combined with clinical observations, patient history, and epidemiological information. The expected result is Negative.  Fact Sheet for Patients:  EntrepreneurPulse.com.au  Fact Sheet for Healthcare Providers:  IncredibleEmployment.be  This test is no t yet approved or cleared by the [place] FDA  and  has been authorized for detection and/or diagnosis of SARS-CoV-2 by FDA under an Emergency Use Authorization (EUA). This EUA will remain  in effect (meaning this test can be used) for the duration of the COVID-19 declaration under Section 564(b)(1) of the Act, 21 U.S.C.section 360bbb-3(b)(1), unless the authorization is terminated  or revoked sooner.       Influenza A by PCR NEGATIVE NEGATIVE Final   Influenza B by PCR NEGATIVE NEGATIVE Final    Comment: (NOTE) The Xpert Xpress SARS-CoV-2/FLU/RSV plus assay is intended as an aid in the diagnosis of influenza from Nasopharyngeal  swab specimens and should not be used as a sole basis for treatment. Nasal washings and aspirates are unacceptable for Xpert Xpress SARS-CoV-2/FLU/RSV testing.  Fact Sheet for Patients: EntrepreneurPulse.com.au  Fact Sheet for Healthcare Providers: IncredibleEmployment.be  This test is not yet approved or cleared by the [place] FDA and has been authorized for detection and/or diagnosis of SARS-CoV-2 by FDA under an Emergency Use Authorization (EUA). This EUA will remain in effect (meaning this test can be used) for the duration of the COVID-19 declaration under Section 564(b)(1) of the Act, 21 U.S.C. section 360bbb-3(b)(1), unless the authorization is terminated or revoked.  Performed at [facility], [address] [place], [place] [address]    Studies/Results: CT ABDOMEN PELVIS WO CONTRAST  Result Date: 04/30/2021 CLINICAL DATA:  Abdominal pain, acute, nonlocalized. Nausea, vomiting, and diarrhea. EXAM: CT ABDOMEN AND PELVIS WITHOUT CONTRAST TECHNIQUE: Multidetector CT imaging of the abdomen and pelvis was performed following the standard protocol without IV contrast. RADIATION DOSE REDUCTION: This exam was performed according to the departmental dose-optimization program which includes automated exposure control, adjustment of the mA and/or kV according to patient size and/or use of iterative reconstruction technique. COMPARISON:  CT abdomen and pelvis 09/14/2019 FINDINGS: Lower chest: Emphysema. Calcified pleural plaques on the right. No pleural effusion or acute basilar lung consolidation. Hepatobiliary: Marked hepatic steatosis. Mildly hyperdense fluid in the gallbladder, possibly concentrated bile. No calcified gallstone, pericholecystic inflammation, or biliary dilatation. Pancreas: Unremarkable. Spleen: Unremarkable. Adrenals/Urinary Tract: Unremarkable adrenal glands. Unchanged 1.5 cm cyst in the upper pole of the left kidney with  minimal peripheral calcification. No renal calculi or hydronephrosis. Unremarkable bladder. Stomach/Bowel: The stomach is unremarkable. There is no evidence of bowel obstruction. Left-sided colonic diverticulosis is noted. There is new mild focal inflammation along the distal descending colon with a suspected underlying inflamed diverticula. No extraluminal gas or fluid collection is present. The appendix is not clearly identified, however there is no pericecal inflammation. Vascular/Lymphatic: Abdominal aortic atherosclerosis without aneurysm. No enlarged lymph nodes. Reproductive: Unremarkable prostate. Other: No ascites or pneumoperitoneum. Increased size of a small fat-containing umbilical hernia. Unchanged small fat-containing left spigelian hernia. Prior bilateral inguinal hernia repair. Musculoskeletal: No acute osseous abnormality or suspicious osseous lesion. IMPRESSION: 1. Mild inflammation of the distal descending colon compatible with acute uncomplicated diverticulitis. 2. Marked hepatic steatosis. 3. Fat-containing umbilical and left spigelian hernias. 4. Aortic Atherosclerosis (ICD10-I70.0) and Emphysema (ICD10-J43.9). Electronically Signed   By: Logan Bores M.D.   On: 04/30/2021 11:36   US ABDOMEN LIMITED RUQ (LIVER/GB)  Result Date: 04/30/2021 CLINICAL DATA:  Abnormal LFTs EXAM: ULTRASOUND ABDOMEN LIMITED RIGHT UPPER QUADRANT COMPARISON:  Same day CT of the abdomen pelvis. FINDINGS: Gallbladder: No gallstones or wall thickening visualized. No sonographic Murphy sign noted by sonographer. Common bile duct: Diameter: 2 mm Liver: No focal lesion identified. Markedly increased parenchymal echogenicity with decreased acoustic penetration. Portal vein is patent on  color Doppler imaging with normal direction of blood flow towards the liver. Other: None. IMPRESSION: The echogenicity of the liver is markedly increased, most commonly seen with fatty infiltration of the liver. There are no obvious focal  liver lesions. Electronically Signed   By: Dahlia Bailiff M.D.   On: 04/30/2021 12:15   Medications: I have reviewed the patient's current medications. Prior to Admission:  Medications Prior to Admission  Medication Sig Dispense Refill Last Dose   amLODipine (NORVASC) 10 MG tablet TAKE 1 TABLET BY MOUTH DAILY (Patient taking differently: Take 10 mg by mouth daily.) 90 tablet 3 04/30/2021 at 0730   hydrochlorothiazide (HYDRODIURIL) 25 MG tablet TAKE 1 TABLET BY MOUTH DAILY (Patient taking differently: Take 25 mg by mouth daily.) 90 tablet 3 04/30/2021 at 0730   losartan (COZAAR) 100 MG tablet TAKE 1 TABLET BY MOUTH DAILY (Patient taking differently: Take 100 mg by mouth daily.) 90 tablet 3 04/30/2021 at 0730   metoprolol succinate (TOPROL-XL) 25 MG 24 hr tablet TAKE 1 TABLET BY MOUTH DAILY (Patient taking differently: Take 25 mg by mouth daily.) 30 tablet 5 04/30/2021 at 0730   Omega-3 Fatty Acids (FISH OIL) 1200 MG CAPS Take 1,200 mg by mouth daily.   04/30/2021 at 0730   omeprazole (PRILOSEC) 20 MG capsule Take 20 mg by mouth daily.   04/30/2021 at 0730   sertraline (ZOLOFT) 50 MG tablet Take 1 tablet (50 mg total) by mouth daily. 90 tablet 3 04/29/2021 at 1430   simvastatin (ZOCOR) 20 MG tablet TAKE 1 TABLET BY MOUTH DAILY AT 6PM (Patient taking differently: Take 20 mg by mouth daily at 6 PM. TAKE 1 TABLET BY MOUTH DAILY AT 6PM) 30 tablet 1 04/29/2021 at 1800   albuterol (PROVENTIL HFA;VENTOLIN HFA) 108 (90 BASE) MCG/ACT inhaler Inhale 2 puffs into the lungs every 6 (six) hours as needed for wheezing. 1 Inhaler 0 prn at unknown   traZODone (DESYREL) 50 MG tablet Take 1 tablet (50 mg total) by mouth at bedtime as needed for sleep. Increase to 100 mg if needed 90 tablet 3 prn at unknown   Scheduled:  amLODipine  10 mg Oral Daily   heparin  5,000 Units Subcutaneous Q8H   metoprolol succinate  25 mg Oral Daily   pantoprazole  40 mg Oral Daily   potassium chloride  40 mEq Oral BID   sertraline  50 mg Oral Daily    Continuous:  lactated ringers with kcl 75 mL/hr at 05/01/21 0933   piperacillin-tazobactam (ZOSYN)  IV Stopped (05/01/21 0908)   BOF:BPZWCHENI, morphine injection, ondansetron **OR** ondansetron (ZOFRAN) IV, oxyCODONE, traMADol, traZODone Anti-infectives (From admission, onward)    Start     Dose/Rate Route Frequency Ordered Stop   04/30/21 2200  piperacillin-tazobactam (ZOSYN) IVPB 3.375 g        3.375 g 12.5 mL/hr over 240 Minutes Intravenous Every 8 hours 04/30/21 1420     04/30/21 1315  piperacillin-tazobactam (ZOSYN) IVPB 3.375 g        3.375 g 100 mL/hr over 30 Minutes Intravenous  Once 04/30/21 1312 04/30/21 1357      Scheduled Meds:  amLODipine  10 mg Oral Daily   heparin  5,000 Units Subcutaneous Q8H   metoprolol succinate  25 mg Oral Daily   pantoprazole  40 mg Oral Daily   potassium chloride  40 mEq Oral BID   sertraline  50 mg Oral Daily   Continuous Infusions:  lactated ringers with kcl 75 mL/hr at 05/01/21 [phone]  piperacillin-tazobactam (ZOSYN)  IV Stopped (05/01/21 0908)   PRN Meds:.albuterol, morphine injection, ondansetron **OR** ondansetron (ZOFRAN) IV, oxyCODONE, traMADol, traZODone   Assessment: Principal Problem:   Diverticulitis  Jonathan Simon is a 61 y.o. male with history of obesity, hypertension, hyperlipidemia, known history of sigmoid diverticulosis is admitted with distal descending uncomplicated diverticulitis.  GI is consulted for elevated LFTs   Plan:  Elevated LFTs, predominantly hepatocellular pattern, acute hepatitis LFTs are improving No evidence of acute liver failure Serum alcohol levels normal Ultrasound liver revealed severe hepatic steatosis.  No evidence of acute cholecystitis, choledocholithiasis or cholelithiasis Acute viral hepatitis panel in process serum acetaminophen, salicylate levels are undetectable Monitor LFTs daily and monitor closely for signs or symptoms of acute liver failure Avoid hepatotoxic agents, if  there is no improvement in transaminases and acute viral hepatitis panel is negative, will pursue further work-up Patient will need close follow-up with GI as outpatient for further management of elevated LFTs which is most likely secondary to severe hepatic steatosis   Left-sided abdominal pain with diarrhea, bloating for 2 weeks: Improving CT abdomen and pelvis showed possible descending colon diverticulitis Recommend stool studies to rule out infection Okay to continue empiric antibiotics Diet as tolerated   Thank you for involving me in the care of this patient.  Dr. Virgina Jock will cover for the weekend    LOS: 1 day   Jonathan Simon 05/01/2021, 11:49 AM

## 2021-05-01 NOTE — Progress Notes (Signed)
Nutrition Brief Note ? ?Patient identified on the Malnutrition Screening Tool (MST) Report ? ?Wt Readings from Last 15 Encounters:  ?04/30/21 97.1 kg  ?01/05/21 98 kg  ?12/29/20 99.8 kg  ?10/01/20 96.2 kg  ?06/03/20 100.7 kg  ?01/02/20 90.7 kg  ?10/31/19 96.2 kg  ?10/17/19 93 kg  ?09/26/19 (!) 95.7 kg  ?09/25/19 (!) 93 kg  ?09/19/19 94.8 kg  ?09/14/19 90.7 kg  ?06/06/19 98.9 kg  ?11/28/18 98.7 kg  ?05/02/17 98.2 kg  ? ?Jonathan Simon is a 61 y.o. male with history of obesity, hypertension, hyperlipidemia, known history of sigmoid diverticulosis is admitted with distal descending uncomplicated diverticulitis. ? ?Pt admitted with acute diverticulitis.  ? ?Spoke with pt and family members at bedside. Pt feels well today, eating ice pops without difficulty. Pt reports decreased appetite over the past week secondary to abdominal pain and inability to keep foods and liquids down. Prior to this event, he had a good appetite, consuming 3 meals per day (Breakfast: eggs, toast, and bacon, Lunch: sandwich, Dinner: meat, starch, and vegetable).  ? ?Pt reports his UBW of 220#. He estimates he has lost 8 pounds within the past 2 weeks. Wt has been stable over the past year.  ? ?Nutrition-Focused physical exam completed. Findings are no fat depletion, no muscle depletion, and no edema.   ? ?Body mass index is 30.71 kg/m?Marland Kitchen Patient meets criteria for obesity, class I based on current BMI.  ? ?Current diet order is clear liquid (advance as tolerated), patient is consuming approximately 100% of meals at this time. Labs and medications reviewed.  ? ?No nutrition interventions warranted at this time. If nutrition issues arise, please consult RD.  ? ?Loistine Chance, RD, LDN, CDCES ?Registered Dietitian II ?Certified Diabetes Care and Education Specialist ?Please refer to [facility] for RD and/or RD on-call/weekend/after hours pager   ?

## 2021-05-02 DIAGNOSIS — R7989 Other specified abnormal findings of blood chemistry: Secondary | ICD-10-CM | POA: Diagnosis not present

## 2021-05-02 DIAGNOSIS — K7030 Alcoholic cirrhosis of liver without ascites: Secondary | ICD-10-CM | POA: Diagnosis not present

## 2021-05-02 DIAGNOSIS — N179 Acute kidney failure, unspecified: Secondary | ICD-10-CM | POA: Diagnosis not present

## 2021-05-02 DIAGNOSIS — K5792 Diverticulitis of intestine, part unspecified, without perforation or abscess without bleeding: Secondary | ICD-10-CM | POA: Diagnosis not present

## 2021-05-02 LAB — CBC
HCT: 38.2 % — ABNORMAL LOW (ref 39.0–52.0)
Hemoglobin: 13.3 g/dL (ref 13.0–17.0)
MCH: 33.6 pg (ref 26.0–34.0)
MCHC: 34.8 g/dL (ref 30.0–36.0)
MCV: 96.5 fL (ref 80.0–100.0)
Platelets: 91 10*3/uL — ABNORMAL LOW (ref 150–400)
RBC: 3.96 MIL/uL — ABNORMAL LOW (ref 4.22–5.81)
RDW: 14.6 % (ref 11.5–15.5)
WBC: 3.7 10*3/uL — ABNORMAL LOW (ref 4.0–10.5)
nRBC: 0 % (ref 0.0–0.2)

## 2021-05-02 LAB — COMPREHENSIVE METABOLIC PANEL
ALT: 199 U/L — ABNORMAL HIGH (ref 0–44)
AST: 238 U/L — ABNORMAL HIGH (ref 15–41)
Albumin: 3.4 g/dL — ABNORMAL LOW (ref 3.5–5.0)
Alkaline Phosphatase: 95 U/L (ref 38–126)
Anion gap: 13 (ref 5–15)
BUN: 13 mg/dL (ref 8–23)
CO2: 31 mmol/L (ref 22–32)
Calcium: 8.1 mg/dL — ABNORMAL LOW (ref 8.9–10.3)
Chloride: 95 mmol/L — ABNORMAL LOW (ref 98–111)
Creatinine, Ser: 2.14 mg/dL — ABNORMAL HIGH (ref 0.61–1.24)
GFR, Estimated: 34 mL/min — ABNORMAL LOW (ref >60)
Glucose, Bld: 92 mg/dL (ref 70–99)
Potassium: 4.4 mmol/L (ref 3.5–5.1)
Sodium: 139 mmol/L (ref 135–145)
Total Bilirubin: 2.9 mg/dL — ABNORMAL HIGH (ref 0.3–1.2)
Total Protein: 6.9 g/dL (ref 6.5–8.1)

## 2021-05-02 LAB — MAGNESIUM: Magnesium: 1.2 mg/dL — ABNORMAL LOW (ref 1.7–2.4)

## 2021-05-02 MED ORDER — MAGNESIUM SULFATE 4 GM/100ML IV SOLN
4.0000 g | Freq: Once | INTRAVENOUS | Status: AC
  Administered 2021-05-02: 4 g via INTRAVENOUS
  Filled 2021-05-02 (×3): qty 100

## 2021-05-02 MED ORDER — SODIUM CHLORIDE 0.9 % IV SOLN: INTRAVENOUS | Status: DC | PRN

## 2021-05-02 NOTE — Plan of Care (Signed)

## 2021-05-02 NOTE — Progress Notes (Addendum)
GI Inpatient Follow-up Note ? ?Subjective: ? ?Patient seen in hospital follow-up for Dr. Marius Ditch over the weekend for acute uncomplicated descending colon diverticulitis and elevated LFTs. No acute events overnight. He denies any new complaints. Pain is 4/10 in severity with pain meds and 6/10 in severity without pain meds. He denies any fevers, altered mental status, nausea, or vomiting. He is tolerating liquids without any issue. His acute hepatitis panel came back with equivocal Hep B core IgM Ab.  ? ?Scheduled Inpatient Medications:  ? heparin  5,000 Units Subcutaneous Q8H  ? metoprolol succinate  25 mg Oral Daily  ? pantoprazole  40 mg Oral Daily  ? sertraline  50 mg Oral Daily  ? spironolactone  25 mg Oral Daily  ? ? ?Continuous Inpatient Infusions: ?  ? magnesium sulfate bolus IVPB    ? piperacillin-tazobactam (ZOSYN)  IV 3.375 g (05/02/21 0526)  ? ? ?PRN Inpatient Medications:  ?albuterol, morphine injection, ondansetron **OR** ondansetron (ZOFRAN) IV, oxyCODONE, traMADol, traZODone ? ?Review of Systems: ?Constitutional: Weight is stable.  ?Eyes: No changes in vision. ?ENT: No oral lesions, sore throat.  ?GI: see HPI.  ?Heme/Lymph: No easy bruising.  ?CV: No chest pain.  ?GU: No hematuria.  ?Integumentary: No rashes.  ?Neuro: No headaches.  ?Psych: No depression/anxiety.  ?Endocrine: No heat/cold intolerance.  ?Allergic/Immunologic: No urticaria.  ?Resp: No cough, SOB.  ?Musculoskeletal: No joint swelling.  ?  ?Physical Examination: ?BP (!) 155/92 (BP Location: Left Arm)   Pulse 70   Temp 97.7 ?F (36.5 ?C)   Resp 17   Ht '5\' 10"'$  (1.778 m)   Wt 97.1 kg   SpO2 100%   BMI 30.71 kg/m?  ?Gen: NAD, alert and oriented x 4 ?HEENT: PEERLA, EOMI, ?Neck: supple, no JVD or thyromegaly ?Chest: CTA bilaterally, no wheezes, crackles, or other adventitious sounds ?CV: RRR, no m/g/c/r ?Abd: soft, ND, +BS in all four quadrants; tender to deep palpation in LLQ, no HSM, guarding, ridigity, or rebound tenderness ?Ext: no  edema, well perfused with 2+ pulses, ?Skin: no rash or lesions noted ?Lymph: no LAD ? ?Data: ?Lab Results  ?Component Value Date  ? WBC 3.7 (L) 05/02/2021  ? HGB 13.3 05/02/2021  ? HCT 38.2 (L) 05/02/2021  ? MCV 96.5 05/02/2021  ? PLT 91 (L) 05/02/2021  ? ?Recent Labs  ?Lab 04/30/21 ?0945 05/01/21 ?2778 05/02/21 ?2423  ?HGB 14.7 13.8 13.3  ? ?Lab Results  ?Component Value Date  ? NA 139 05/02/2021  ? K 4.4 05/02/2021  ? CL 95 (L) 05/02/2021  ? CO2 31 05/02/2021  ? BUN 13 05/02/2021  ? CREATININE 2.14 (H) 05/02/2021  ? ?Lab Results  ?Component Value Date  ? ALT 199 (H) 05/02/2021  ? AST 238 (H) 05/02/2021  ? ALKPHOS 95 05/02/2021  ? BILITOT 2.9 (H) 05/02/2021  ? ?Recent Labs  ?Lab 05/01/21 ?5361  ?APTT 32  ?INR 1.0  ? ?Assessment/Plan: ? ?61 y/o Caucasian male with a PMH of obesity, HTN, and HLD admitted to [facility] 3/2 this week for nausea, vomiting, diarrhea, and LLQ abdominal pain found to have acute complicated descending colon diverticulitis. GI consulted for elevated LFTs.  ? ?Elevated LFTs - mainly hepatocellular, aminotransferases are trending down. No signs of overt hepatic decompensation or acute liver failure. His Hep B core IgM Ab was equivocal yesterday and a subsequent Hep B surface antibody was ordered and is pending. US liver shows severe hepatic steatosis which is likely etiology of his elevated LFTs ? ?Acute uncomplicated descending colon diverticulitis -  improving, on antibiotics  ? ?AKI - improving  ? ?Electrolyte derangement - resolved and repleted  ? ?Diarrhea  ? ?Recommendations: ? ?- Continue antibiotics for diverticulitis. Can transition to oral antibiotics.  ?- Advance to low residue and soft diet ?- Continue serial abdominal examinations ?- Follow-up on Hep B surface antibody serology.  ?- LFTs trending down. Continue to monitor LFTs daily.  ?- Avoid hepatotoxic agents ?- Outpatient GI follow-up with Dr. Marius Ditch for work-up of severe hepatic steatosis, elevated LFTs, and consideration of repeat  colonoscopy to exclude underlying colonic neoplasm and examine extent of diverticular disease ? ?Please call with questions or concerns. ? ? ? ?Octavia Bruckner, PA-C ?[facility] ?[phone] ?[phone] (Cell) ? ? ? ?

## 2021-05-02 NOTE — Progress Notes (Signed)
?PROGRESS NOTE ? ?Jonathan Simon    DOB: 1960/08/10, 61 y.o.  ?DTO:[id number]  ?  Code Status: Full Code   ?DOA: 04/30/2021   LOS: 2  ? ?Brief hospital course  ?Jonathan Simon is a 61 y.o. male with a PMH significant for alcohol dependence in remission since 03/2021, HTN, HLD, depression/anxiety, GERD, obesity, steatohepatitis. ?They presented from home to the ED on 04/30/2021 with N/V/D/abdominal pain x 14 days. Denied blood in emesis or stool.  ?In the ED, it was found that they had several electrolyte abnormalities including hyponatremia, hypokalemia, hypochloremia, hypomagnesemia. Also positive for diverticulitis on CT abdomen/pelvis. RUQ [place] confirmed fatty liver disease.   ?They were treated with electrolyte replacement and supportive care. He was started on IV zosyn in treatment of the diverticulitis. GI was consulted for further evaluation. ?Patient was admitted to medicine service for further workup and management of diverticulitis as outlined in detail below. ? ?05/02/21 -stable, improved. ? ?Assessment & Plan  ?Principal Problem: ?  Diverticulitis ?Active Problems: ?  Abnormal LFTs ?  AKI (acute kidney injury) ([place]) ?  Hypomagnesemia ?  Hypokalemia ?  Alcoholic cirrhosis of liver without ascites ([place]) ? ?Acute Diverticulitis- Pain is improved ?- GI following, appreciate recs ?- continue zosyn ?- advance diet as tolerated ?- zofran PRN ? ?AKI (improving)  hypokalemia (resolved)- K+ 3.3>2.9>4.4. Mg++ 1.7>1.2 today s/p magnesium replacement. Cr 3.62>>2.90>2.14 ?- CMP am, Mg++ am ?- electrolyte replacement PRN  ? ?Compensated liver cirrhosis  Thrombocytopenia- no signs of acute bleeding. INR 1.0. platelets 124>101>91. Transaminases are elevated and gradually improving ?- CBC, CMP am ?- f/u hepatic panel ?- PT/OT ? ?HTN- better controlled ?- discontinue home amlodipine as there are antihypertensives with secondary benefits to use first ?- continue metoprolol ?- initiate spironolactone which will increase K+  and benefit in liver disease ? ?GERD ?- continue home prilosec ? ?Depression/anxiety-  ?- continue home zoloft, trazodone ? ?Body mass index is 30.71 kg/m?. ? ?VTE ppx: heparin injection 5,000 Units Start: 04/30/21 1400 ? ?Diet:  ?   ?Diet  ? Diet clear liquid Room service appropriate? Yes; Fluid consistency: Thin  ? ?Subjective 05/02/21   ? ?Pt reports no complaints. He endorses good tolerance of the diet advancing. ?  ?Objective  ? ?Vitals:  ? 05/01/21 1019 05/01/21 1534 05/01/21 2026 05/01/21 2335  ?BP: (!) 141/91 116/84 124/89 (!) 132/98  ?Pulse: 64 66 66 69  ?Resp: '16 16 15 15  '$ ?Temp: 98 ?F (36.7 ?C) 98.2 ?F (36.8 ?C) 98.3 ?F (36.8 ?C) 98.5 ?F (36.9 ?C)  ?TempSrc:      ?SpO2: 96% 93% 94% 96%  ?Weight:      ?Height:      ? ? ?Intake/Output Summary (Last 24 hours) at 05/02/2021 0840 ?Last data filed at 05/02/2021 0422 ?Gross per 24 hour  ?Intake 781.35 ml  ?Output 900 ml  ?Net -118.65 ml  ? ? ?Filed Weights  ? 04/30/21 0940  ?Weight: 97.1 kg  ?  ? ?Physical Exam:  ?General: awake, alert, NAD ?HEENT: atraumatic, clear conjunctiva, anicteric sclera, MMM, hearing grossly normal ?Respiratory: normal respiratory effort. ?Cardiovascular: quick capillary refill, normal S1/S2, RRR, no JVD, murmurs ?Gastrointestinal: soft, NT, ND ?Nervous: A&O x3. no gross focal neurologic deficits, normal speech ?Extremities: moves all equally, no edema, normal tone ?Skin: dry, intact, normal temperature, normal color. No rashes, lesions or ulcers on exposed skin ?Psychiatry: normal mood, congruent affect ? ?Labs   ?I have personally reviewed the following labs and imaging studies ?CBC ?   ?  Component Value Date/Time  ? WBC 3.7 (L) 05/02/2021 9326  ? RBC 3.96 (L) 05/02/2021 7124  ? HGB 13.3 05/02/2021 0608  ? HCT 38.2 (L) 05/02/2021 5809  ? PLT 91 (L) 05/02/2021 9833  ? MCV 96.5 05/02/2021 0608  ? MCH 33.6 05/02/2021 0608  ? MCHC 34.8 05/02/2021 0608  ? RDW 14.6 05/02/2021 0608  ? LYMPHSABS 1.0 10/01/2020 0851  ? MONOABS 0.6 10/01/2020 0851   ? EOSABS 0.0 10/01/2020 0851  ? BASOSABS 0.0 10/01/2020 0851  ? ?BMP Latest Ref Rng & Units 05/02/2021 05/01/2021 04/30/2021  ?Glucose 70 - 99 mg/dL 92 103(H) 116(H)  ?BUN 8 - 23 mg/dL 13 35(H) 35(H)  ?Creatinine 0.61 - 1.24 mg/dL 2.14(H) 2.90(H) 3.47(H)  ?Sodium 135 - 145 mmol/L 139 132(L) 127(L)  ?Potassium 3.5 - 5.1 mmol/L 4.4 2.9(L) 3.3(L)  ?Chloride 98 - 111 mmol/L 95(L) 88(L) 83(L)  ?CO2 22 - 32 mmol/L '31 29 24  '$ ?Calcium 8.9 - 10.3 mg/dL 8.1(L) 7.2(L) 7.0(L)  ? ? ?CT ABDOMEN PELVIS WO CONTRAST ? ?Result Date: 04/30/2021 ?CLINICAL DATA:  Abdominal pain, acute, nonlocalized. Nausea, vomiting, and diarrhea. EXAM: CT ABDOMEN AND PELVIS WITHOUT CONTRAST TECHNIQUE: Multidetector CT imaging of the abdomen and pelvis was performed following the standard protocol without IV contrast. RADIATION DOSE REDUCTION: This exam was performed according to the departmental dose-optimization program which includes automated exposure control, adjustment of the mA and/or kV according to patient size and/or use of iterative reconstruction technique. COMPARISON:  CT abdomen and pelvis 09/14/2019 FINDINGS: Lower chest: Emphysema. Calcified pleural plaques on the right. No pleural effusion or acute basilar lung consolidation. Hepatobiliary: Marked hepatic steatosis. Mildly hyperdense fluid in the gallbladder, possibly concentrated bile. No calcified gallstone, pericholecystic inflammation, or biliary dilatation. Pancreas: Unremarkable. Spleen: Unremarkable. Adrenals/Urinary Tract: Unremarkable adrenal glands. Unchanged 1.5 cm cyst in the upper pole of the left kidney with minimal peripheral calcification. No renal calculi or hydronephrosis. Unremarkable bladder. Stomach/Bowel: The stomach is unremarkable. There is no evidence of bowel obstruction. Left-sided colonic diverticulosis is noted. There is new mild focal inflammation along the distal descending colon with a suspected underlying inflamed diverticula. No extraluminal gas or fluid  collection is present. The appendix is not clearly identified, however there is no pericecal inflammation. Vascular/Lymphatic: Abdominal aortic atherosclerosis without aneurysm. No enlarged lymph nodes. Reproductive: Unremarkable prostate. Other: No ascites or pneumoperitoneum. Increased size of a small fat-containing umbilical hernia. Unchanged small fat-containing left spigelian hernia. Prior bilateral inguinal hernia repair. Musculoskeletal: No acute osseous abnormality or suspicious osseous lesion. IMPRESSION: 1. Mild inflammation of the distal descending colon compatible with acute uncomplicated diverticulitis. 2. Marked hepatic steatosis. 3. Fat-containing umbilical and left spigelian hernias. 4. Aortic Atherosclerosis (ICD10-I70.0) and Emphysema (ICD10-J43.9). Electronically Signed   By: Logan Bores M.D.   On: 04/30/2021 11:36  ? ?US ABDOMEN LIMITED RUQ (LIVER/GB) ? ?Result Date: 04/30/2021 ?CLINICAL DATA:  Abnormal LFTs EXAM: ULTRASOUND ABDOMEN LIMITED RIGHT UPPER QUADRANT COMPARISON:  Same day CT of the abdomen pelvis. FINDINGS: Gallbladder: No gallstones or wall thickening visualized. No sonographic Murphy sign noted by sonographer. Common bile duct: Diameter: 2 mm Liver: No focal lesion identified. Markedly increased parenchymal echogenicity with decreased acoustic penetration. Portal vein is patent on color Doppler imaging with normal direction of blood flow towards the liver. Other: None. IMPRESSION: The echogenicity of the liver is markedly increased, most commonly seen with fatty infiltration of the liver. There are no obvious focal liver lesions. Electronically Signed   By: Dahlia Bailiff M.D.   On: 04/30/2021 12:15   ? ?  Disposition Plan & Communication  ?Patient status: Inpatient  ?Admitted From: Home ?Planned disposition location: Home ?Anticipated discharge date: 3/6 pending clinical improvement and electrolyte stabilization/AKI resolution ? ?Family Communication: none  ?  ?Author: ?Richarda Osmond, DO ?[facility] ?05/02/2021, 8:40 AM  ? ?Available by Epic secure chat 7AM-7PM. ?If 7PM-7AM, please contact night-coverage.  ?[facility] contact information found on [email or web address]. ? ?

## 2021-05-03 DIAGNOSIS — K7030 Alcoholic cirrhosis of liver without ascites: Secondary | ICD-10-CM | POA: Diagnosis not present

## 2021-05-03 DIAGNOSIS — E876 Hypokalemia: Secondary | ICD-10-CM | POA: Diagnosis not present

## 2021-05-03 DIAGNOSIS — N179 Acute kidney failure, unspecified: Secondary | ICD-10-CM | POA: Diagnosis not present

## 2021-05-03 DIAGNOSIS — K5792 Diverticulitis of intestine, part unspecified, without perforation or abscess without bleeding: Secondary | ICD-10-CM | POA: Diagnosis not present

## 2021-05-03 LAB — COMPREHENSIVE METABOLIC PANEL
ALT: 174 U/L — ABNORMAL HIGH (ref 0–44)
AST: 194 U/L — ABNORMAL HIGH (ref 15–41)
Albumin: 3.7 g/dL (ref 3.5–5.0)
Alkaline Phosphatase: 85 U/L (ref 38–126)
Anion gap: 13 (ref 5–15)
BUN: 23 mg/dL (ref 8–23)
CO2: 29 mmol/L (ref 22–32)
Calcium: 8.6 mg/dL — ABNORMAL LOW (ref 8.9–10.3)
Chloride: 96 mmol/L — ABNORMAL LOW (ref 98–111)
Creatinine, Ser: 1.61 mg/dL — ABNORMAL HIGH (ref 0.61–1.24)
GFR, Estimated: 48 mL/min — ABNORMAL LOW (ref >60)
Glucose, Bld: 92 mg/dL (ref 70–99)
Potassium: 3.4 mmol/L — ABNORMAL LOW (ref 3.5–5.1)
Sodium: 138 mmol/L (ref 135–145)
Total Bilirubin: 2.1 mg/dL — ABNORMAL HIGH (ref 0.3–1.2)
Total Protein: 7.2 g/dL (ref 6.5–8.1)

## 2021-05-03 LAB — CBC
HCT: 37.4 % — ABNORMAL LOW (ref 39.0–52.0)
Hemoglobin: 12.8 g/dL — ABNORMAL LOW (ref 13.0–17.0)
MCH: 33.4 pg (ref 26.0–34.0)
MCHC: 34.2 g/dL (ref 30.0–36.0)
MCV: 97.7 fL (ref 80.0–100.0)
Platelets: 90 10*3/uL — ABNORMAL LOW (ref 150–400)
RBC: 3.83 MIL/uL — ABNORMAL LOW (ref 4.22–5.81)
RDW: 14.6 % (ref 11.5–15.5)
WBC: 3.9 10*3/uL — ABNORMAL LOW (ref 4.0–10.5)
nRBC: 0 % (ref 0.0–0.2)

## 2021-05-03 MED ORDER — HYDROCHLOROTHIAZIDE 25 MG PO TABS
25.0000 mg | ORAL_TABLET | Freq: Every day | ORAL | Status: DC
  Administered 2021-05-03: 25 mg via ORAL
  Filled 2021-05-03: qty 1

## 2021-05-03 MED ORDER — POTASSIUM CHLORIDE CRYS ER 20 MEQ PO TBCR
40.0000 meq | EXTENDED_RELEASE_TABLET | Freq: Two times a day (BID) | ORAL | Status: AC
  Administered 2021-05-03 (×2): 40 meq via ORAL
  Filled 2021-05-03 (×2): qty 2

## 2021-05-03 MED ORDER — SPIRONOLACTONE 25 MG PO TABS
50.0000 mg | ORAL_TABLET | Freq: Every day | ORAL | Status: DC
  Administered 2021-05-04: 50 mg via ORAL
  Filled 2021-05-03: qty 2

## 2021-05-03 MED ORDER — AMLODIPINE BESYLATE 10 MG PO TABS
10.0000 mg | ORAL_TABLET | Freq: Every day | ORAL | Status: DC
Start: 2021-05-03 — End: 2021-05-04
  Administered 2021-05-03 – 2021-05-04 (×2): 10 mg via ORAL
  Filled 2021-05-03 (×2): qty 1

## 2021-05-03 MED ORDER — HYDRALAZINE HCL 20 MG/ML IJ SOLN
10.0000 mg | Freq: Once | INTRAMUSCULAR | Status: AC
  Administered 2021-05-03: 10 mg via INTRAVENOUS
  Filled 2021-05-03: qty 1

## 2021-05-03 NOTE — Progress Notes (Signed)
Manual blood pressure 198/118. Dr. Damita Dunnings made aware. See new orders. ?

## 2021-05-03 NOTE — Progress Notes (Signed)
Pt ambulated around nsg station  ?

## 2021-05-03 NOTE — Progress Notes (Signed)
Requested PRN medication for elevated blood pressure. No new order received ?

## 2021-05-03 NOTE — Progress Notes (Signed)
?PROGRESS NOTE ? ?Jonathan Simon    DOB: 07-14-1960, 61 y.o.  ?DXA:[id number]  ?  Code Status: Full Code   ?DOA: 04/30/2021   LOS: 3  ? ?Brief hospital course  ?Jonathan Simon is a 61 y.o. male with a PMH significant for alcohol dependence in remission since 03/2021, HTN, HLD, depression/anxiety, GERD, obesity, steatohepatitis. ?They presented from home to the ED on 04/30/2021 with N/V/D/abdominal pain x 14 days. Denied blood in emesis or stool.  ?In the ED, it was found that they had several electrolyte abnormalities including hyponatremia, hypokalemia, hypochloremia, hypomagnesemia. Also positive for diverticulitis on CT abdomen/pelvis. RUQ [place] confirmed fatty liver disease.   ?They were treated with electrolyte replacement and supportive care. He was started on IV zosyn in treatment of the diverticulitis. GI was consulted for further evaluation. ?Patient was admitted to medicine service for further workup and management of diverticulitis as outlined in detail below. ? ?05/03/21 -stable, improved. ? ?Assessment & Plan  ?Principal Problem: ?  Diverticulitis ?Active Problems: ?  Abnormal LFTs ?  AKI (acute kidney injury) ([place]) ?  Hypomagnesemia ?  Hypokalemia ?  Alcoholic cirrhosis of liver without ascites ([place]) ? ?Acute Diverticulitis- Pain is improved ?- GI following, appreciate recs ?- continue zosyn can convert to PO tomorrow if remains stable ?- advance diet as tolerated ?- zofran PRN ? ?AKI (improving)  hypokalemia (resolved)- K+ 3.3>2.9>4.4>3.4. Mg++ 1.7>1.2 today s/p magnesium replacement. Cr 3.62>>2.90>2.14>1.61 ?- CMP am, Mg++ am ?- electrolyte replacement PRN  ? ?Compensated liver cirrhosis  Thrombocytopenia- no signs of acute bleeding. INR 1.0. platelets 124>101>91>90. Transaminases are elevated and gradually improving ?- CBC, CMP am ?- f/u hepatic panel ?- PT/OT ? ?HTN- better controlled but still elevated in 209O systolics ?- discontinue home amlodipine as there are antihypertensives with secondary  benefits to use first ?- continue metoprolol ?- increase spironolactone which will increase K+ and benefit in liver disease ? ?GERD ?- continue home prilosec ? ?Depression/anxiety-  ?- continue home zoloft, trazodone ? ?Body mass index is 30.71 kg/m?. ? ?VTE ppx: heparin injection 5,000 Units Start: 04/30/21 1400 ? ?Diet:  ?   ?Diet  ? DIET SOFT Room service appropriate? Yes; Fluid consistency: Thin  ? ?Subjective 05/03/21   ? ?Pt reports he is doing well overall. Pain is resolving. He thinks he can advance diet.  ?  ?Objective  ? ?Vitals:  ? 05/02/21 1525 05/02/21 2116 05/03/21 0334 05/03/21 0543  ?BP: (!) 143/89 (!) 160/103 (!) 187/110 (!) 169/98  ?Pulse: 63 (!) 59 64 63  ?Resp: '18 16 18   '$ ?Temp: 98.4 ?F (36.9 ?C) 98.3 ?F (36.8 ?C) 98.5 ?F (36.9 ?C)   ?TempSrc:      ?SpO2: 95% 98% 97% 98%  ?Weight:      ?Height:      ? ? ?Intake/Output Summary (Last 24 hours) at 05/03/2021 0746 ?Last data filed at 05/03/2021 7096 ?Gross per 24 hour  ?Intake --  ?Output 1525 ml  ?Net -1525 ml  ? ? ?Filed Weights  ? 04/30/21 0940  ?Weight: 97.1 kg  ?  ? ?Physical Exam:  ?General: awake, alert, NAD ?HEENT: atraumatic, clear conjunctiva, anicteric sclera, MMM, hearing grossly normal ?Respiratory: normal respiratory effort. ?Cardiovascular: quick capillary refill, normal S1/S2, RRR, no JVD, murmurs ?Gastrointestinal: soft, NT, ND ?Nervous: A&O x3. no gross focal neurologic deficits, normal speech ?Extremities: moves all equally, no edema, normal tone ?Skin: dry, intact, normal temperature, normal color. No rashes, lesions or ulcers on exposed skin ?Psychiatry: normal mood, congruent  affect ? ?Labs   ?I have personally reviewed the following labs and imaging studies ?CBC ?   ?Component Value Date/Time  ? WBC 3.9 (L) 05/03/2021 0514  ? RBC 3.83 (L) 05/03/2021 0514  ? HGB 12.8 (L) 05/03/2021 0514  ? HCT 37.4 (L) 05/03/2021 0514  ? PLT 90 (L) 05/03/2021 0514  ? MCV 97.7 05/03/2021 0514  ? MCH 33.4 05/03/2021 0514  ? MCHC 34.2 05/03/2021 0514   ? RDW 14.6 05/03/2021 0514  ? LYMPHSABS 1.0 10/01/2020 0851  ? MONOABS 0.6 10/01/2020 0851  ? EOSABS 0.0 10/01/2020 0851  ? BASOSABS 0.0 10/01/2020 0851  ? ?BMP Latest Ref Rng & Units 05/03/2021 05/02/2021 05/01/2021  ?Glucose 70 - 99 mg/dL 92 92 103(H)  ?BUN 8 - 23 mg/dL 23 13 35(H)  ?Creatinine 0.61 - 1.24 mg/dL 1.61(H) 2.14(H) 2.90(H)  ?Sodium 135 - 145 mmol/L 138 139 132(L)  ?Potassium 3.5 - 5.1 mmol/L 3.4(L) 4.4 2.9(L)  ?Chloride 98 - 111 mmol/L 96(L) 95(L) 88(L)  ?CO2 22 - 32 mmol/L '29 31 29  '$ ?Calcium 8.9 - 10.3 mg/dL 8.6(L) 8.1(L) 7.2(L)  ? ? ?No results found. ? ?Disposition Plan & Communication  ?Patient status: Inpatient  ?Admitted From: Home ?Planned disposition location: Home ?Anticipated discharge date: 3/6 pending clinical improvement and electrolyte stabilization/AKI resolution ? ?Family Communication: none  ?  ?Author: ?Richarda Osmond, DO ?[facility] ?05/03/2021, 7:46 AM  ? ?Available by Epic secure chat 7AM-7PM. ?If 7PM-7AM, please contact night-coverage.  ?[facility] contact information found on [email or web address]. ? ?

## 2021-05-03 NOTE — Plan of Care (Signed)

## 2021-05-04 DIAGNOSIS — N179 Acute kidney failure, unspecified: Secondary | ICD-10-CM | POA: Diagnosis not present

## 2021-05-04 DIAGNOSIS — K7030 Alcoholic cirrhosis of liver without ascites: Secondary | ICD-10-CM | POA: Diagnosis not present

## 2021-05-04 DIAGNOSIS — K5792 Diverticulitis of intestine, part unspecified, without perforation or abscess without bleeding: Secondary | ICD-10-CM | POA: Diagnosis not present

## 2021-05-04 DIAGNOSIS — E876 Hypokalemia: Secondary | ICD-10-CM | POA: Diagnosis not present

## 2021-05-04 LAB — HEPATITIS B SURFACE ANTIBODY, QUANTITATIVE: Hep B S AB Quant (Post): 63.2 m[IU]/mL (ref >9.9)

## 2021-05-04 LAB — BASIC METABOLIC PANEL
Anion gap: 11 (ref 5–15)
BUN: 18 mg/dL (ref 8–23)
CO2: 26 mmol/L (ref 22–32)
Calcium: 8.7 mg/dL — ABNORMAL LOW (ref 8.9–10.3)
Chloride: 103 mmol/L (ref 98–111)
Creatinine, Ser: 1.26 mg/dL — ABNORMAL HIGH (ref 0.61–1.24)
GFR, Estimated: >60 mL/min (ref >60)
Glucose, Bld: 84 mg/dL (ref 70–99)
Potassium: 3.5 mmol/L (ref 3.5–5.1)
Sodium: 140 mmol/L (ref 135–145)

## 2021-05-04 MED ORDER — OMEPRAZOLE 20 MG PO CPDR: 20.0000 mg | DELAYED_RELEASE_CAPSULE | Freq: Every day | ORAL | Status: DC | PRN

## 2021-05-04 MED ORDER — HYDROCHLOROTHIAZIDE 25 MG PO TABS
50.0000 mg | ORAL_TABLET | Freq: Every day | ORAL | Status: DC
  Administered 2021-05-04: 50 mg via ORAL
  Filled 2021-05-04: qty 2

## 2021-05-04 MED ORDER — METRONIDAZOLE 500 MG PO TABS: 500.0000 mg | ORAL_TABLET | Freq: Three times a day (TID) | ORAL | 0 refills | Status: AC

## 2021-05-04 MED ORDER — SPIRONOLACTONE 50 MG PO TABS: 50.0000 mg | ORAL_TABLET | Freq: Every day | ORAL | 0 refills | Status: DC

## 2021-05-04 MED ORDER — CIPROFLOXACIN HCL 500 MG PO TABS: 500.0000 mg | ORAL_TABLET | Freq: Two times a day (BID) | ORAL | 0 refills | Status: AC

## 2021-05-04 NOTE — Discharge Instructions (Signed)
I'm glad you are feeling better.  ?I have sent two antibiotics to your pharmacy to complete your treatment, please take them until they are complete.  ?I have also started a new blood pressure medication to get better control of your blood pressure and will also increase your potassium as a side effect which will be a good addition since your potassium was low while in the hospital ?Please make an appointment to follow up  ?

## 2021-05-04 NOTE — Plan of Care (Signed)
Patient sleeping between care. Pain controlled. No new changes in assessment. ? ? ?Problem: Education: ?Goal: Knowledge of General Education information will improve ?Description: Including pain rating scale, medication(s)/side effects and non-pharmacologic comfort measures ?Outcome: Progressing ?  ?Problem: Health Behavior/Discharge Planning: ?Goal: Ability to manage health-related needs will improve ?Outcome: Progressing ?  ?Problem: Clinical Measurements: ?Goal: Ability to maintain clinical measurements within normal limits will improve ?Outcome: Progressing ?Goal: Will remain free from infection ?Outcome: Progressing ?Goal: Diagnostic test results will improve ?Outcome: Progressing ?Goal: Respiratory complications will improve ?Outcome: Progressing ?Goal: Cardiovascular complication will be avoided ?Outcome: Progressing ?  ?Problem: Activity: ?Goal: Risk for activity intolerance will decrease ?Outcome: Progressing ?  ?Problem: Nutrition: ?Goal: Adequate nutrition will be maintained ?Outcome: Progressing ?  ?Problem: Coping: ?Goal: Level of anxiety will decrease ?Outcome: Progressing ?  ?Problem: Elimination: ?Goal: Will not experience complications related to bowel motility ?Outcome: Progressing ?Goal: Will not experience complications related to urinary retention ?Outcome: Progressing ?  ?Problem: Pain Managment: ?Goal: General experience of comfort will improve ?Outcome: Progressing ?  ?Problem: Safety: ?Goal: Ability to remain free from injury will improve ?Outcome: Progressing ?  ?Problem: Skin Integrity: ?Goal: Risk for impaired skin integrity will decrease ?Outcome: Progressing ?   ?

## 2021-05-04 NOTE — Progress Notes (Signed)
?  INTERVAL PROGRESS NOTE ? ? ? ?Jonathan Simon- 61 y.o. male ? ?LOS: 4 ?__________________________________________________________________ ? ?Admitted 04/30/2021  ? ?Patient was discharged 05/04/2021 ? ?May return to work on 05/07/2021 if symptoms have resolved.  ? ? ?Richarda Osmond, DO ?[facility] ?05/04/2021, 11:12 AM  ? ?

## 2021-05-04 NOTE — Progress Notes (Signed)
Discharge instructions reviewed with pt. He verbalized understanding of instructions 

## 2021-05-04 NOTE — Discharge Summary (Incomplete)
Physician Discharge Summary  Patient: Jonathan Simon BLT:[id number] DOB: 10-Mar-1960   Code Status: Full Code Admit date: 04/30/2021 Discharge date: 05/04/2021 Disposition: Home, No home health services recommended PCP: Crecencio Mc, MD  Recommendations for Outpatient Follow-up:  Follow up with PCP within 1-2 weeks Monitor blood pressures which were elevated to as high as 007M systolics and asymptomatic. spironolactone was added to regimen to treat both blood pressure and hypokalemia '50mg'$  Metabolic panel- K+   Discharge Diagnoses:  Principal Problem:   Diverticulitis Active Problems:   Abnormal LFTs   AKI (acute kidney injury) ([place])   Hypomagnesemia   Hypokalemia   Alcoholic cirrhosis of liver without ascites [facility])  Brief Hospital Course Summary: Pt ***  Discharge Condition: {DISCHARGE CONDITION:19696}, improved Recommended discharge diet: {Discharge AUQJ:[id number]}  Consultations: ***  Procedures/Studies: ***   Allergies as of 05/04/2021       Reactions   Latex Rash        Medication List     STOP taking these medications    Fish Oil 1200 MG Caps       TAKE these medications    albuterol 108 (90 Base) MCG/ACT inhaler Commonly known as: VENTOLIN HFA Inhale 2 puffs into the lungs every 6 (six) hours as needed for wheezing.   amLODipine 10 MG tablet Commonly known as: NORVASC TAKE 1 TABLET BY MOUTH DAILY   ciprofloxacin 500 MG tablet Commonly known as: Cipro Take 1 tablet (500 mg total) by mouth 2 (two) times daily for 7 days.   hydrochlorothiazide 25 MG tablet Commonly known as: HYDRODIURIL TAKE 1 TABLET BY MOUTH DAILY   losartan 100 MG tablet Commonly known as: COZAAR TAKE 1 TABLET BY MOUTH DAILY   metoprolol succinate 25 MG 24 hr tablet Commonly known as: TOPROL-XL TAKE 1 TABLET BY MOUTH DAILY   metroNIDAZOLE 500 MG tablet Commonly known as: Flagyl Take 1 tablet (500 mg total) by mouth 3 (three) times daily for 7 days.    omeprazole 20 MG capsule Commonly known as: PRILOSEC Take 1 capsule (20 mg total) by mouth daily as needed. What changed:  when to take this reasons to take this   sertraline 50 MG tablet Commonly known as: ZOLOFT Take 1 tablet (50 mg total) by mouth daily.   simvastatin 20 MG tablet Commonly known as: ZOCOR TAKE 1 TABLET BY MOUTH DAILY AT 6PM What changed: See the new instructions.   spironolactone 50 MG tablet Commonly known as: ALDACTONE Take 1 tablet (50 mg total) by mouth daily. Start taking on: May 05, 2021   traZODone 50 MG tablet Commonly known as: DESYREL Take 1 tablet (50 mg total) by mouth at bedtime as needed for sleep. Increase to 100 mg if needed         Subjective   Pt reports *** Objective  Blood pressure (!) 195/113, pulse 77, temperature 98.2 F (36.8 C), resp. rate 18, height '5\' 10"'$  (1.778 m), weight 97.1 kg, SpO2 99 %.   General: Pt is alert, awake, not in acute distress Cardiovascular: RRR, S1/S2 +, no rubs, no gallops Respiratory: CTA bilaterally, no wheezing, no rhonchi Abdominal: Soft, NT, ND, bowel sounds + Extremities: no edema, no cyanosis   The results of significant diagnostics from this hospitalization (including imaging, microbiology, ancillary and laboratory) are listed below for reference.   Imaging studies: CT ABDOMEN PELVIS WO CONTRAST  Result Date: 04/30/2021 CLINICAL DATA:  Abdominal pain, acute, nonlocalized. Nausea, vomiting, and diarrhea. EXAM: CT ABDOMEN AND PELVIS WITHOUT CONTRAST  TECHNIQUE: Multidetector CT imaging of the abdomen and pelvis was performed following the standard protocol without IV contrast. RADIATION DOSE REDUCTION: This exam was performed according to the departmental dose-optimization program which includes automated exposure control, adjustment of the mA and/or kV according to patient size and/or use of iterative reconstruction technique. COMPARISON:  CT abdomen and pelvis 09/14/2019 FINDINGS: Lower chest:  Emphysema. Calcified pleural plaques on the right. No pleural effusion or acute basilar lung consolidation. Hepatobiliary: Marked hepatic steatosis. Mildly hyperdense fluid in the gallbladder, possibly concentrated bile. No calcified gallstone, pericholecystic inflammation, or biliary dilatation. Pancreas: Unremarkable. Spleen: Unremarkable. Adrenals/Urinary Tract: Unremarkable adrenal glands. Unchanged 1.5 cm cyst in the upper pole of the left kidney with minimal peripheral calcification. No renal calculi or hydronephrosis. Unremarkable bladder. Stomach/Bowel: The stomach is unremarkable. There is no evidence of bowel obstruction. Left-sided colonic diverticulosis is noted. There is new mild focal inflammation along the distal descending colon with a suspected underlying inflamed diverticula. No extraluminal gas or fluid collection is present. The appendix is not clearly identified, however there is no pericecal inflammation. Vascular/Lymphatic: Abdominal aortic atherosclerosis without aneurysm. No enlarged lymph nodes. Reproductive: Unremarkable prostate. Other: No ascites or pneumoperitoneum. Increased size of a small fat-containing umbilical hernia. Unchanged small fat-containing left spigelian hernia. Prior bilateral inguinal hernia repair. Musculoskeletal: No acute osseous abnormality or suspicious osseous lesion. IMPRESSION: 1. Mild inflammation of the distal descending colon compatible with acute uncomplicated diverticulitis. 2. Marked hepatic steatosis. 3. Fat-containing umbilical and left spigelian hernias. 4. Aortic Atherosclerosis (ICD10-I70.0) and Emphysema (ICD10-J43.9). Electronically Signed   By: Logan Bores M.D.   On: 04/30/2021 11:36   US ABDOMEN LIMITED RUQ (LIVER/GB)  Result Date: 04/30/2021 CLINICAL DATA:  Abnormal LFTs EXAM: ULTRASOUND ABDOMEN LIMITED RIGHT UPPER QUADRANT COMPARISON:  Same day CT of the abdomen pelvis. FINDINGS: Gallbladder: No gallstones or wall thickening visualized. No  sonographic Murphy sign noted by sonographer. Common bile duct: Diameter: 2 mm Liver: No focal lesion identified. Markedly increased parenchymal echogenicity with decreased acoustic penetration. Portal vein is patent on color Doppler imaging with normal direction of blood flow towards the liver. Other: None. IMPRESSION: The echogenicity of the liver is markedly increased, most commonly seen with fatty infiltration of the liver. There are no obvious focal liver lesions. Electronically Signed   By: Dahlia Bailiff M.D.   On: 04/30/2021 12:15    Labs: Basic Metabolic Panel: Recent Labs  Lab 04/30/21 0945 04/30/21 1402 05/01/21 0548 05/02/21 0608 05/03/21 0514 05/04/21 0628  NA 126* 127* 132* 139 138 140  K 3.1* 3.3* 2.9* 4.4 3.4* 3.5  CL 81* 83* 88* 95* 96* 103  CO2 20* '24 29 31 29 26  '$ GLUCOSE 127* 116* 103* 92 92 84  BUN 35* 35* 35* '13 23 18  '$ CREATININE 3.62* 3.47* 2.90* 2.14* 1.61* 1.26*  CALCIUM 7.4* 7.0* 7.2* 8.1* 8.6* 8.7*  MG 0.9* 1.7 1.7 1.2*  --   --   PHOS 6.1*  --   --   --   --   --    CBC: Recent Labs  Lab 04/30/21 0945 05/01/21 0548 05/02/21 0608 05/03/21 0514  WBC 6.0 3.8* 3.7* 3.9*  HGB 14.7 13.8 13.3 12.8*  HCT 39.7 38.0* 38.2* 37.4*  MCV 90.0 92.9 96.5 97.7  PLT 124* 101* 91* 90*   Microbiology: ***  Time coordinating discharge: Over 30 minutes  Richarda Osmond, MD  [facility] 05/04/2021, 11:14 AM

## 2021-05-05 ENCOUNTER — Encounter: Payer: Self-pay | Admitting: Internal Medicine

## 2021-05-05 DIAGNOSIS — Z09 Encounter for follow-up examination after completed treatment for conditions other than malignant neoplasm: Secondary | ICD-10-CM | POA: Insufficient documentation

## 2021-05-06 ENCOUNTER — Inpatient Hospital Stay: Payer: 59 | Admitting: Internal Medicine

## 2021-05-06 ENCOUNTER — Telehealth: Payer: Self-pay

## 2021-05-06 NOTE — Telephone Encounter (Signed)
Transition Care Management Follow-up Telephone Call ?Date of discharge and from where: 05/04/21 [facility] ?How have you been since you were released from the hospital? Patient reports still weak with some diarrhea. Staying hydrated. Appetite decreased but eating. Denies vomiting, ABD pain. Nausea passes with BM.  ?Any questions or concerns? No ? ?Items Reviewed: ?Did the pt receive and understand the discharge instructions provided? Yes  ?Medications obtained and verified? Yes  ?Any new allergies since your discharge? No  ?Dietary orders reviewed? Yes ?Do you have support at home? Yes  ? ?Home Care and Equipment/Supplies: ?Were home health services ordered? No ? ?Functional Questionnaire: (I = Independent and D = Dependent) ?ADLs: I ? ?Bathing/Dressing- I ? ?Meal Prep- I ? ?Eating- I ? ?Maintaining continence- I ? ?Transferring/Ambulation- I ? ?Managing Meds- I ? ?Follow up appointments reviewed: ? ?PCP Hospital f/u appt confirmed? Yes  Scheduled to see PCP on 05/08/21 @ 11:30. ?Are transportation arrangements needed? No  ?If their condition worsens, is the pt aware to call PCP or go to the Emergency Dept.? Yes ?Was the patient provided with contact information for the PCP's office or ED? Yes ?Was to pt encouraged to call back with questions or concerns? Yes  ?

## 2021-05-07 NOTE — Telephone Encounter (Signed)
Pt no showed for his appt.  ?

## 2021-05-08 ENCOUNTER — Ambulatory Visit: Payer: 59 | Admitting: Internal Medicine

## 2021-05-08 ENCOUNTER — Other Ambulatory Visit: Payer: Self-pay

## 2021-05-08 ENCOUNTER — Encounter: Payer: Self-pay | Admitting: Internal Medicine

## 2021-05-08 VITALS — BP 142/100 | HR 68 | Temp 98.5°F | Ht 70.0 in | Wt 204.0 lb

## 2021-05-08 DIAGNOSIS — Z8709 Personal history of other diseases of the respiratory system: Secondary | ICD-10-CM

## 2021-05-08 DIAGNOSIS — K5792 Diverticulitis of intestine, part unspecified, without perforation or abscess without bleeding: Secondary | ICD-10-CM

## 2021-05-08 DIAGNOSIS — I10 Essential (primary) hypertension: Secondary | ICD-10-CM

## 2021-05-08 DIAGNOSIS — R7989 Other specified abnormal findings of blood chemistry: Secondary | ICD-10-CM

## 2021-05-08 DIAGNOSIS — I700 Atherosclerosis of aorta: Secondary | ICD-10-CM | POA: Diagnosis not present

## 2021-05-08 DIAGNOSIS — F10982 Alcohol use, unspecified with alcohol-induced sleep disorder: Secondary | ICD-10-CM

## 2021-05-08 DIAGNOSIS — F1010 Alcohol abuse, uncomplicated: Secondary | ICD-10-CM

## 2021-05-08 DIAGNOSIS — E876 Hypokalemia: Secondary | ICD-10-CM

## 2021-05-08 DIAGNOSIS — Z09 Encounter for follow-up examination after completed treatment for conditions other than malignant neoplasm: Secondary | ICD-10-CM

## 2021-05-08 DIAGNOSIS — J439 Emphysema, unspecified: Secondary | ICD-10-CM | POA: Insufficient documentation

## 2021-05-08 DIAGNOSIS — K7010 Alcoholic hepatitis without ascites: Secondary | ICD-10-CM

## 2021-05-08 DIAGNOSIS — N179 Acute kidney failure, unspecified: Secondary | ICD-10-CM

## 2021-05-08 LAB — BASIC METABOLIC PANEL
BUN: 16 mg/dL (ref 6–23)
CO2: 28 mEq/L (ref 19–32)
Calcium: 10 mg/dL (ref 8.4–10.5)
Chloride: 102 mEq/L (ref 96–112)
Creatinine, Ser: 1.26 mg/dL (ref 0.40–1.50)
GFR: 61.11 mL/min (ref >60.00)
Glucose, Bld: 84 mg/dL (ref 70–99)
Potassium: 3.3 mEq/L — ABNORMAL LOW (ref 3.5–5.1)
Sodium: 140 mEq/L (ref 135–145)

## 2021-05-08 LAB — MAGNESIUM: Magnesium: 1.4 mg/dL — ABNORMAL LOW (ref 1.5–2.5)

## 2021-05-08 MED ORDER — ROSUVASTATIN CALCIUM 10 MG PO TABS
10.0000 mg | ORAL_TABLET | Freq: Every day | ORAL | 1 refills | Status: DC
Start: 2021-05-08 — End: 2021-10-12

## 2021-05-08 NOTE — Assessment & Plan Note (Signed)
Reviewed findings of prior CT scan today..  Patient is tolerating medium potency statin therapy and willing to transition to high potency therapy with rosuvastatin 10 mg daily  ?

## 2021-05-08 NOTE — Assessment & Plan Note (Signed)
He has been abstinent since discharged,  Has joined AA , and has a sponsor he is meeting with  ?

## 2021-05-08 NOTE — Progress Notes (Signed)
Subjective:  Patient ID: Jonathan Simon, male    DOB: [birth year]  Age: 61 y.o. MRN: [id number]  CC: The primary encounter diagnosis was Diverticulitis. 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   This visit occurred during the SARS-CoV-2 public health emergency.  Safety protocols were in place, including screening questions prior to the visit, additional usage of staff PPE, and extensive cleaning of exam room while observing appropriate contact time as indicated for disinfecting solutions.    HPI Zimir AZION CENTRELLA presents for  Chief Complaint  Patient presents with   Hospitalization Follow-up   History : Patient admitted to hospital on arch 2 with acute abd pain and diarrhea.  He was admitted with acute diverticulitis uncomplicated.  Treated with broad spectrum antibiotics  and discharged on march 6 with continued antibiotic regimen of cipro and metronidazole. .  At time of discharge the cramping had stopped.  But once he returned home the cramping returned, thought not as severe .  He continues to have frequent stools,  up to 6 daily.  Described as  soft and unformed  but  not watery.  He reports that  Some stools are occurring in the middle of the night .  He is eating a bland diet that includes yogurt  daily.   denies any alcohol. NO fevers or bloody appearing stools.  Feels he can return to work on Monday if the stool frequency and consistency has improved   2) uncontrolled htn:  spironolactone added during admission.  Has not started it yet   3) severe fatty liver,  emphysema and aortic atherosclerosis noted on admission CT scan.   All reviewed today.    Alcoholic cirrhosis listed on d/c summary .  He is  lifelong non smoker.  Works at [employer].  He has a history of alcohol abuse and has been abstinent since discharge .  He is going to AA and  HAS A SPONSOR    Outpatient Medications Prior to Visit  Medication Sig Dispense Refill   albuterol (PROVENTIL HFA;VENTOLIN HFA) 108 (90 BASE) MCG/ACT inhaler Inhale 2 puffs into the lungs every 6 (six) hours as needed for wheezing. 1 Inhaler 0   amLODipine (NORVASC) 10 MG tablet TAKE 1 TABLET BY MOUTH DAILY (Patient taking differently: Take 10 mg by mouth daily.) 90 tablet 3   ciprofloxacin (CIPRO) 500 MG tablet Take 1 tablet (500 mg total) by mouth 2 (two) times daily for 7 days. 14 tablet 0   hydrochlorothiazide (HYDRODIURIL) 25 MG tablet TAKE 1 TABLET BY MOUTH DAILY (Patient taking differently: Take 25 mg by mouth daily.) 90 tablet 3   losartan (COZAAR) 100 MG tablet TAKE 1 TABLET BY MOUTH DAILY (Patient taking differently: Take 100 mg by mouth daily.) 90 tablet 3   metoprolol succinate (TOPROL-XL) 25 MG 24 hr tablet TAKE 1 TABLET BY MOUTH DAILY (Patient taking differently: Take 25 mg by mouth daily.) 30 tablet 5   metroNIDAZOLE (FLAGYL) 500 MG tablet Take 1 tablet (500 mg total) by mouth 3 (three) times daily for 7 days. 21 tablet 0   omeprazole (PRILOSEC) 20 MG capsule Take 1 capsule (20 mg total) by mouth daily as needed.     sertraline (ZOLOFT) 50 MG tablet Take 1 tablet (50 mg  total) by mouth daily. 90 tablet 3   spironolactone (ALDACTONE) 50 MG tablet Take 1 tablet (50 mg total) by mouth daily. 30 tablet 0   traZODone (DESYREL) 50 MG tablet Take 1 tablet (50 mg total) by mouth at bedtime as needed for sleep. Increase to 100 mg if needed 90 tablet 3   simvastatin (ZOCOR) 20 MG tablet TAKE 1 TABLET BY MOUTH DAILY AT 6PM (Patient taking differently: Take 20 mg by mouth daily at 6 PM. TAKE 1 TABLET BY MOUTH DAILY AT 6PM) 30 tablet 1   No facility-administered medications prior to visit.     Review of Systems;  Patient denies headache, fevers, malaise, unintentional weight loss, skin rash, eye pain, sinus congestion and sinus pain, sore throat, dysphagia,  hemoptysis , cough, dyspnea, wheezing, chest pain, palpitations, orthopnea, edema, abdominal pain, nausea, melena, diarrhea, constipation, flank pain, dysuria, hematuria, urinary  Frequency, nocturia, numbness, tingling, seizures,  Focal weakness, Loss of consciousness,  Tremor, insomnia, depression, anxiety, and suicidal ideation.      Objective:  BP (!) 142/100 (BP Location: Left Arm, Patient Position: Sitting, Cuff Size: Large)    Pulse 68    Temp 98.5 F (36.9 C) (Oral)    Ht '5\' 10"'$  (1.778 m)    Wt 204 lb (92.5 kg)    SpO2 98%    BMI 29.27 kg/m   BP Readings from Last 3 Encounters:  05/08/21 (!) 142/100  05/04/21 (!) 177/117  01/05/21 128/84    Wt Readings from Last 3 Encounters:  05/08/21 204 lb (92.5 kg)  04/30/21 214 lb (97.1 kg)  01/05/21 216 lb (98 kg)    General appearance: alert, cooperative and appears stated age Ears: normal TM's and external ear canals both ears Throat: lips, mucosa, and tongue normal; teeth and gums normal Neck: no adenopathy, no carotid bruit, supple, symmetrical, trachea midline and thyroid not enlarged, symmetric, no tenderness/mass/nodules Back: symmetric, no curvature. ROM normal. No CVA tenderness. Lungs: clear to auscultation bilaterally Heart: regular rate and rhythm, S1, S2 normal, no murmur, click, rub or gallop Abdomen: soft, non-tender; bowel sounds normal; no masses,  no organomegaly Pulses: 2+ and symmetric Skin: Skin color, texture, turgor normal. No rashes or lesions Lymph nodes: Cervical, supraclavicular, and axillary nodes normal.  Lab Results  Component Value Date   HGBA1C 5.2 04/22/2016    Lab Results  Component Value Date   CREATININE 1.26 05/08/2021   CREATININE 1.26 (H) 05/04/2021   CREATININE 1.61 (H) 05/03/2021    Lab Results  Component Value  Date   WBC 3.9 (L) 05/03/2021   HGB 12.8 (L) 05/03/2021   HCT 37.4 (L) 05/03/2021   PLT 90 (L) 05/03/2021   GLUCOSE 84 05/08/2021   CHOL 153 06/06/2019   TRIG 105.0 06/06/2019   HDL 45.50 06/06/2019   LDLDIRECT 88.0 05/02/2017   LDLCALC 87 06/06/2019   ALT 174 (H) 05/03/2021   AST 194 (H) 05/03/2021   NA 140 05/08/2021   K 3.3 (L) 05/08/2021   CL 102 05/08/2021   CREATININE 1.26 05/08/2021   BUN 16 05/08/2021   CO2 28 05/08/2021   TSH 1.03 09/24/2019   PSA 0.79 07/07/2018   INR 1.0 05/01/2021   HGBA1C 5.2 04/22/2016    CT ABDOMEN PELVIS WO CONTRAST  Result Date: 04/30/2021 CLINICAL DATA:  Abdominal pain, acute, nonlocalized. Nausea, vomiting, and diarrhea. EXAM: CT ABDOMEN AND PELVIS WITHOUT CONTRAST TECHNIQUE: Multidetector CT imaging of the abdomen and pelvis was performed following the standard protocol without IV contrast.  RADIATION DOSE REDUCTION: This exam was performed according to the departmental dose-optimization program which includes automated exposure control, adjustment of the mA and/or kV according to patient size and/or use of iterative reconstruction technique. COMPARISON:  CT abdomen and pelvis 09/14/2019 FINDINGS: Lower chest: Emphysema. Calcified pleural plaques on the right. No pleural effusion or acute basilar lung consolidation. Hepatobiliary: Marked hepatic steatosis. Mildly hyperdense fluid in the gallbladder, possibly concentrated bile. No calcified gallstone, pericholecystic inflammation, or biliary dilatation. Pancreas: Unremarkable. Spleen: Unremarkable. Adrenals/Urinary Tract: Unremarkable adrenal glands. Unchanged 1.5 cm cyst in the upper pole of the left kidney with minimal peripheral calcification. No renal calculi or hydronephrosis. Unremarkable bladder. Stomach/Bowel: The stomach is unremarkable. There is no evidence of bowel obstruction. Left-sided colonic diverticulosis is noted. There is new mild focal inflammation along the distal descending colon with  a suspected underlying inflamed diverticula. No extraluminal gas or fluid collection is present. The appendix is not clearly identified, however there is no pericecal inflammation. Vascular/Lymphatic: Abdominal aortic atherosclerosis without aneurysm. No enlarged lymph nodes. Reproductive: Unremarkable prostate. Other: No ascites or pneumoperitoneum. Increased size of a small fat-containing umbilical hernia. Unchanged small fat-containing left spigelian hernia. Prior bilateral inguinal hernia repair. Musculoskeletal: No acute osseous abnormality or suspicious osseous lesion. IMPRESSION: 1. Mild inflammation of the distal descending colon compatible with acute uncomplicated diverticulitis. 2. Marked hepatic steatosis. 3. Fat-containing umbilical and left spigelian hernias. 4. Aortic Atherosclerosis (ICD10-I70.0) and Emphysema (ICD10-J43.9). Electronically Signed   By: Logan Bores M.D.   On: 04/30/2021 11:36   US ABDOMEN LIMITED RUQ (LIVER/GB)  Result Date: 04/30/2021 CLINICAL DATA:  Abnormal LFTs EXAM: ULTRASOUND ABDOMEN LIMITED RIGHT UPPER QUADRANT COMPARISON:  Same day CT of the abdomen pelvis. FINDINGS: Gallbladder: No gallstones or wall thickening visualized. No sonographic Murphy sign noted by sonographer. Common bile duct: Diameter: 2 mm Liver: No focal lesion identified. Markedly increased parenchymal echogenicity with decreased acoustic penetration. Portal vein is patent on color Doppler imaging with normal direction of blood flow towards the liver. Other: None. IMPRESSION: The echogenicity of the liver is markedly increased, most commonly seen with fatty infiltration of the liver. There are no obvious focal liver lesions. Electronically Signed   By: Dahlia Bailiff M.D.   On: 04/30/2021 12:15    Assessment & Plan:   Problem List Items Addressed This Visit     Hypertension (Chronic)    Not at goal on 4 drug therapy:  Losartan, hctz metoprolol and amlodipine. Hospitalist added  spironolactone but  has not started medication. Advised to start and return in one week for BP check and BMET .  Secondary causes need to be ruled out.       Relevant Medications   rosuvastatin (CRESTOR) 10 MG tablet   Other Relevant Orders   VAS US RENAL ARTERY DUPLEX   Urine Microalbumin w/creat. 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  History of alcohol abuse may also have contributed.  Will replace with mg oxide.       Relevant Orders   Magnesium   Hypokalemia    Persisent, secondary to  Recurrent hypomagnesemia.  Continue replacement .      Relevant Orders   Basic metabolic panel (Completed)   Magnesium (Completed)   Comprehensive metabolic panel   Hospital discharge follow-up    Patient is stable post discharge from [facility] on March 6  and has no new  issues or unanswered questions about his discharge plans.        Hepatitis, alcoholic    Improving liver enzymes but not normalized.  Repeat in [redacted] weeks along with BAL to monitor sobriety      Relevant Orders   Ethanol   Emphysema lung ([place])    Incidental finding on CT done durng hospitalization for diverticulitis.  He is a lifelong nonsmoker.  Alpha 1 antitrypsin level ordered       Diverticulitis - Primary    Symptoms are improving but not resolved.  Recommend lactose free bland diet and use of Benefiber to help bulk up stools.  Return to work on Monday provided his frequency has improved.  Referring to Dr Bary Castilla for follow up as advised by hospitalist       Relevant Orders   Ambulatory referral to General Surgery   Alcohol abuse    He has been abstinent since discharged,  Has joined AA , and has a sponsor he is meeting with       Relevant Orders   Ethanol   AKI (acute kidney injury) ([place])    Secondary to prerenal azotemia due to 2 week history of profuse diarrhea and 8 lb weight loss,  Renal function has returned to normal, and gap  has closed   Lab Results  Component Value Date   NA 140 05/08/2021   K 3.3 (L) 05/08/2021   CL 102 05/08/2021   CO2 28 05/08/2021   Lab Results  Component Value Date   CREATININE 1.26 05/08/2021         Abnormal LFTs    AST/ALT  400/300 on admission march 2 despite report of alcohol abstinence since Jan 3.  Trending down steadily.  Will repeat in 2 weeks markedly fatty liver noted on imaging studies without stigmata of cirrhosis.   Lab Results  Component Value Date   ALT 174 (H) 05/03/2021   AST 194 (H) 05/03/2021   ALKPHOS 85 05/03/2021   BILITOT 2.1 (H) 05/03/2021         Abdominal aortic atherosclerosis (HCC)    Reviewed findings of prior CT scan today..  Patient is tolerating medium potency statin therapy and willing to transition to high potency therapy with rosuvastatin 10 mg daily       Relevant Medications   rosuvastatin (CRESTOR) 10 MG tablet   Other Visit Diagnoses     History of emphysema       Relevant Orders   Alpha-1-antitrypsin   Comprehensive metabolic panel       I spent 30 minutes dedicated to the care of this patient on the date of this encounter to include pre-visit review of patient's medical history,  most recent imaging studies, Face-to-face time with the patient , and post visit ordering of testing and therapeutics.    Follow-up: No follow-ups on file.   Crecencio Mc, MD

## 2021-05-08 NOTE — Assessment & Plan Note (Signed)
Incidental finding on CT done durng hospitalization for diverticulitis.  He is a lifelong nonsmoker.  Alpha 1 antitrypsin level ordered  ?

## 2021-05-08 NOTE — Assessment & Plan Note (Addendum)
Not at goal on 4 drug therapy:  Losartan, hctz metoprolol and amlodipine. Hospitalist added  spironolactone but has not started medication. Advised to start and return in one week for BP check and BMET .  Secondary causes need to be ruled out.  ?

## 2021-05-08 NOTE — Patient Instructions (Addendum)
Stop the yogurt.  NO DAIRY UNTIL STOOLS ARE SOLID.  ? ?Start using Benefiber  twice daily .  It will help bulk up your stools ? ?Pick up a generic probiotic AT [place]  ? ?Start the spironolactone today.    IT WILL LOWER YOUR BLOOD PRESSURE AND GET RID OF EXCESS FLUID ? ?RN VISIT ONE WEEK TO CHECK POTASSIUM AND BLOOD PRESSURE AGAIN ? ? ?

## 2021-05-09 MED ORDER — MAGNESIUM OXIDE -MG SUPPLEMENT 200 MG PO TABS
1.0000 | ORAL_TABLET | Freq: Two times a day (BID) | ORAL | 0 refills | Status: DC
Start: 2021-05-09 — End: 2022-08-26

## 2021-05-09 MED ORDER — POTASSIUM CHLORIDE CRYS ER 20 MEQ PO TBCR: 20.0000 meq | EXTENDED_RELEASE_TABLET | Freq: Every day | ORAL | 3 refills | Status: DC

## 2021-05-09 NOTE — Assessment & Plan Note (Addendum)
Continue trazodone  and alcohol abstinence ?

## 2021-05-09 NOTE — Assessment & Plan Note (Signed)
Improving liver enzymes but not normalized.  Repeat in [redacted] weeks along with BAL to monitor sobriety ?

## 2021-05-09 NOTE — Assessment & Plan Note (Addendum)
Secondary to prerenal azotemia due to 2 week history of profuse diarrhea and 8 lb weight loss,  Renal function has returned to normal, and gap has closed  ? ?Lab Results  ?Component Value Date  ? NA 140 05/08/2021  ? K 3.3 (L) 05/08/2021  ? CL 102 05/08/2021  ? CO2 28 05/08/2021  ? ?Lab Results  ?Component Value Date  ? CREATININE 1.26 05/08/2021  ? ? ?

## 2021-05-09 NOTE — Assessment & Plan Note (Signed)
AST/ALT  400/300 on admission march 2 despite report of alcohol abstinence since Jan 3.  Trending down steadily.  Will repeat in 2 weeks markedly fatty liver noted on imaging studies without stigmata of cirrhosis.  ? ?Lab Results  ?Component Value Date  ? ALT 174 (H) 05/03/2021  ? AST 194 (H) 05/03/2021  ? ALKPHOS 85 05/03/2021  ? BILITOT 2.1 (H) 05/03/2021  ? ? ?

## 2021-05-09 NOTE — Assessment & Plan Note (Signed)
Patient is stable post discharge from [facility] on March 6  and has no new issues or unanswered questions about his discharge plans.   ?

## 2021-05-09 NOTE — Assessment & Plan Note (Signed)
Secondary to diarrhea/Gi Losses.  History of alcohol abuse may also have contributed.  Will replace with mg oxide.  ?

## 2021-05-09 NOTE — Assessment & Plan Note (Signed)
Persisent, secondary to  Recurrent hypomagnesemia.  Continue replacement . ?

## 2021-05-09 NOTE — Assessment & Plan Note (Signed)
Symptoms are improving but not resolved.  Recommend lactose free bland diet and use of Benefiber to help bulk up stools.  Return to work on Monday provided his frequency has improved.  Referring to Dr Bary Castilla for follow up as advised by hospitalist  ?

## 2021-05-11 ENCOUNTER — Telehealth: Payer: Self-pay | Admitting: Internal Medicine

## 2021-05-11 LAB — ALPHA-1-ANTITRYPSIN: A-1 Antitrypsin, Ser: 153 mg/dL (ref 83–199)

## 2021-05-11 NOTE — Telephone Encounter (Signed)
Anderson Malta from [employer], [phone] called. Patient was prescribed spironolactone (ALDACTONE) 50 MG tablet and this medication can cause issues with patient's potassium.  ?

## 2021-05-11 NOTE — Telephone Encounter (Signed)
Is it okay for pharmacy to still fill?  ?

## 2021-05-12 ENCOUNTER — Ambulatory Visit: Payer: 59

## 2021-05-12 NOTE — Telephone Encounter (Signed)
Spoke with pharmacy to let them know that per Dr. Derrel Nip it is okay to go ahead and fill the medication. Pt will be having labs drawn to recheck potassium levels.  ?

## 2021-05-13 ENCOUNTER — Other Ambulatory Visit: Payer: Self-pay

## 2021-05-13 ENCOUNTER — Ambulatory Visit (INDEPENDENT_AMBULATORY_CARE_PROVIDER_SITE_OTHER): Payer: 59

## 2021-05-13 VITALS — BP 124/86 | HR 72

## 2021-05-13 DIAGNOSIS — Z8709 Personal history of other diseases of the respiratory system: Secondary | ICD-10-CM | POA: Diagnosis not present

## 2021-05-13 DIAGNOSIS — I10 Essential (primary) hypertension: Secondary | ICD-10-CM | POA: Diagnosis not present

## 2021-05-13 DIAGNOSIS — E876 Hypokalemia: Secondary | ICD-10-CM

## 2021-05-13 LAB — COMPREHENSIVE METABOLIC PANEL
ALT: 99 U/L — ABNORMAL HIGH (ref 0–53)
AST: 79 U/L — ABNORMAL HIGH (ref 0–37)
Albumin: 4.4 g/dL (ref 3.5–5.2)
Alkaline Phosphatase: 77 U/L (ref 39–117)
BUN: 25 mg/dL — ABNORMAL HIGH (ref 6–23)
CO2: 28 mEq/L (ref 19–32)
Calcium: 9.7 mg/dL (ref 8.4–10.5)
Chloride: 103 mEq/L (ref 96–112)
Creatinine, Ser: 1.51 mg/dL — ABNORMAL HIGH (ref 0.40–1.50)
GFR: 49.18 mL/min — ABNORMAL LOW (ref >60.00)
Glucose, Bld: 106 mg/dL — ABNORMAL HIGH (ref 70–99)
Potassium: 3.4 mEq/L — ABNORMAL LOW (ref 3.5–5.1)
Sodium: 139 mEq/L (ref 135–145)
Total Bilirubin: 1.2 mg/dL (ref 0.2–1.2)
Total Protein: 6.8 g/dL (ref 6.0–8.3)

## 2021-05-13 NOTE — Progress Notes (Addendum)
Patient here for nurse visit BP check per order from [place] 05/08/21.  ? ?Patient reports compliance with prescribed BP medications: yes, Amlodipine 10 mg po daily, HCTZ 25 mg po daily, Losartan 100 mg po daily, Metoprolol 25 mg po daily, & Spironolactone 50 mg po daily.  ? ?Last dose of BP medication: This morning '@8'$ :30 ? ?BP Readings from Last 3 Encounters:  ?05/13/21 124/86  ?05/08/21 (!) 142/100  ?05/04/21 (!) 177/117  ? ?Pulse Readings from Last 3 Encounters:  ?05/13/21 72  ?05/08/21 68  ?05/04/21 64  ? ?Rechecked BP 5 mins post initial reading. Readings as followed: ?R:122/80, PR:75, O2:95 ?Informed pt that note would be sent to provider, and we would f/u with any changes.  ? ?Patient verbalized understanding of instructions.  ? ?Arsenio Katz, CMA  ? ? ?I have reviewed the above information and agree with above.   BP has normalized,  continue your current medications  ? ?Deborra Medina, MD ? ?

## 2021-05-14 NOTE — Addendum Note (Signed)
Addended by: Crecencio Mc on: 05/14/2021 05:46 PM ? ? Modules accepted: Orders ? ?

## 2021-05-25 ENCOUNTER — Other Ambulatory Visit: Payer: Self-pay

## 2021-05-25 ENCOUNTER — Other Ambulatory Visit (INDEPENDENT_AMBULATORY_CARE_PROVIDER_SITE_OTHER): Payer: 59

## 2021-05-25 DIAGNOSIS — R739 Hyperglycemia, unspecified: Secondary | ICD-10-CM | POA: Diagnosis not present

## 2021-05-25 DIAGNOSIS — E876 Hypokalemia: Secondary | ICD-10-CM

## 2021-05-25 DIAGNOSIS — K7010 Alcoholic hepatitis without ascites: Secondary | ICD-10-CM

## 2021-05-25 DIAGNOSIS — I10 Essential (primary) hypertension: Secondary | ICD-10-CM | POA: Diagnosis not present

## 2021-05-25 DIAGNOSIS — F1010 Alcohol abuse, uncomplicated: Secondary | ICD-10-CM

## 2021-05-25 LAB — COMPREHENSIVE METABOLIC PANEL
ALT: 71 U/L — ABNORMAL HIGH (ref 0–53)
AST: 75 U/L — ABNORMAL HIGH (ref 0–37)
Albumin: 4.3 g/dL (ref 3.5–5.2)
Alkaline Phosphatase: 67 U/L (ref 39–117)
BUN: 13 mg/dL (ref 6–23)
CO2: 28 mEq/L (ref 19–32)
Calcium: 9.5 mg/dL (ref 8.4–10.5)
Chloride: 102 mEq/L (ref 96–112)
Creatinine, Ser: 1.12 mg/dL (ref 0.40–1.50)
GFR: 70.37 mL/min (ref >60.00)
Glucose, Bld: 106 mg/dL — ABNORMAL HIGH (ref 70–99)
Potassium: 4.7 mEq/L (ref 3.5–5.1)
Sodium: 137 mEq/L (ref 135–145)
Total Bilirubin: 1.2 mg/dL (ref 0.2–1.2)
Total Protein: 6.8 g/dL (ref 6.0–8.3)

## 2021-05-25 LAB — MICROALBUMIN / CREATININE URINE RATIO
Creatinine,U: 129.8 mg/dL
Microalb Creat Ratio: 0.9 mg/g (ref 0.0–30.0)
Microalb, Ur: 1.2 mg/dL (ref 0.0–1.9)

## 2021-05-25 LAB — MAGNESIUM: Magnesium: 1.9 mg/dL (ref 1.5–2.5)

## 2021-05-25 LAB — HEMOGLOBIN A1C: Hgb A1c MFr Bld: 5.1 % (ref 4.6–6.5)

## 2021-05-26 LAB — ETHANOL

## 2021-05-28 ENCOUNTER — Other Ambulatory Visit: Payer: Self-pay | Admitting: General Surgery

## 2021-05-28 ENCOUNTER — Encounter (INDEPENDENT_AMBULATORY_CARE_PROVIDER_SITE_OTHER): Payer: 59

## 2021-05-28 NOTE — Progress Notes (Addendum)
Subjective:  ?  ? Patient ID: Jonathan Simon is a 61 y.o. male. ?  ?HPI ?  ?The following portions of the patient's history were reviewed and updated as appropriate.  ?  ?This an established patient is here today for: office visit. The patient has been referred by Dr. Derrel Nip for evaluation of diverticulitis. Patient reports he was seen through the [place] in and was then admitted to [facility] on 04-30-21 for 5 days. Patient reports bowel movements 2-3 times per day. He reports the stools are soft and formed. Patient denies any bleeding or mucus. Patient has his last colonoscopy done 06-25-16. He reports he had not had issues with the diverticulitis until this year.  ?  ?The patient reports he awoke 1 morning with a what he describes "abdominal cramp" and this became associated with loss of appetite.  Diarrhea was present, no mucus, blood or purulent fluid.  He stayed home with minimal oral intake for 2 weeks thinking the next day would "be better".  He eventually presented to the hospital where he was noted to be dehydrated and subsequent evaluation with imaging completed.  He was admitted for IV antibiotics and then transition to oral antibiotics for suspected diverticulitis. ?  ?  ?   ?Chief Complaint  ?Patient presents with  ? Follow-up  ?  ?  ?BP 114/74   Pulse 69   Temp 36.4 ?C (97.6 ?F)   Ht 177.8 cm ('5\' 10"'$ )   Wt 90.3 kg (199 lb)   SpO2 98%   BMI 28.55 kg/m?  ?  ?    ?Past Medical History:  ?Diagnosis Date  ? GERD (gastroesophageal reflux disease)    ? Hemorrhoid    ? Hyperlipidemia    ? Hypertension    ? Sinusitis, acute, maxillary    ? Spontaneous pneumothorax    ?  ?  ?     ?Past Surgical History:  ?Procedure Laterality Date  ? REVISION COLOSTOMY   1988  ?  closure  ? THORACOTOMY Left 1997  ? COLONOSCOPY   06/25/2016  ?  Dr Bary Castilla  ? INGUINAL HERNIA REPAIR Right 10/02/2019  ?  Robotic by  Dr Hampton Abbot  ? HEMORRHOID SURGERY      ? INGUINAL HERNIA REPAIR Left    ?  ?  ?  ?  ?Social History  ?  ?  ?     ?Socioeconomic History  ? Marital status: Married  ?Tobacco Use  ? Smoking status: Never  ? Smokeless tobacco: Never  ?Vaping Use  ? Vaping Use: Never used  ?Substance and Sexual Activity  ? Alcohol use: Not Currently  ? Drug use: Never  ? Sexual activity: Defer  ?  ?  ?  ?    ?Allergies  ?Allergen Reactions  ? Latex Rash  ?  ?  ?Current Medications  ?      ?Current Outpatient Medications  ?Medication Sig Dispense Refill  ? albuterol 90 mcg/actuation inhaler Inhale 2 inhalations into the lungs every 6 (six) hours as needed 18 g 0  ? ALPRAZolam (XANAX) 0.5 MG tablet        ? amLODIPine (NORVASC) 10 MG tablet Take 10 mg by mouth once daily.      ? amLODIPine (NORVASC) 10 MG tablet Take 1 tablet by mouth once daily      ? fluticasone propionate (FLONASE) 50 mcg/actuation nasal spray Place 1 spray into both nostrils 2 (two) times daily 16 g 0  ? hydroCHLOROthiazide (HYDRODIURIL) 25  MG tablet        ? LORazepam (ATIVAN) 0.5 MG tablet 1-2 TABS BID PRN 15 tablet 0  ? losartan (COZAAR) 100 MG tablet        ? losartan (COZAAR) 50 MG tablet Take 50 mg by mouth once daily.      ? losartan-hydrochlorothiazide (HYZAAR) 100-25 mg tablet        ? magnesium oxide 200 mg magnesium Tab Take by mouth      ? metoprolol succinate (TOPROL-XL) 25 MG XL tablet Take by mouth      ? niacin 500 MG tablet Take 500 mg by mouth once daily.      ? potassium chloride (KLOR-CON) 20 MEQ ER tablet Take 1 tablet by mouth once daily      ? promethazine (PHENERGAN) 12.5 MG tablet Take 1 tablet (12.5 mg total) by mouth every 8 (eight) hours as needed for Nausea 20 tablet 0  ? sertraline (ZOLOFT) 50 MG tablet Take 50 mg by mouth once daily      ? simvastatin (ZOCOR) 20 MG tablet Take 20 mg by mouth once daily.      ? tadalafil (CIALIS) 2.5 mg tablet Take by mouth as needed         ? traZODone (DESYREL) 50 MG tablet        ?  ?No current facility-administered medications for this visit.  ?  ?  ?  ?     ?Family History  ?Problem Relation Age of Onset  ?  COPD Mother    ? Heart disease Father    ? Heart disease Paternal Grandfather    ? Colon cancer Neg Hx    ?  ?  ?  ?Labs and Radiology:  ?  ?June 25, 2016: Pathology: ?  ?DIAGNOSIS:  ?A. RECTUM POLYP, UPPER; COLD BIOPSY:  ?- TUBULAR ADENOMA.  ?- NEGATIVE FOR HIGH GRADE DYSPLASIA AND MALIGNANCY.  ?  ?Colonoscopy for that date reviewed. ?  ?The ?colonoscopy was somewhat difficult due to significant looping and a tortuous ?Procedure: ?colon. Successful completion of the procedure was aided by applying abdominal pressure. The patient tolerated ?the procedure well. The quality of the bowel preparation was excellent. ?  ?  ?Abdominal ultrasound/CT of the abdomen and pelvis March 6 second, 2023: ?  ?Both of these imaging studies were reviewed.  Very focal area of reported diverticulitis at the junction of the descending sigmoid colon, well proximal to any of the diverticuli noted on the same exam. ? Other: No ascites or pneumoperitoneum. Increased size of a small ?fat-containing umbilical hernia. Unchanged small fat-containing left ?spigelian hernia. Prior bilateral inguinal hernia repair. ?  ?Laboratory review: ?  ?Component Ref Range & Units 3 wk ago ?(05/03/21) 3 wk ago ?(05/02/21) 3 wk ago ?(05/01/21) 4 wk ago ?(04/30/21) 5 mo ago ?(12/29/20) 7 mo ago ?(10/01/20) 11 mo ago ?(06/03/20)  ?WBC 4.0 - 10.5 K/uL 3.9 Low   3.7 Low   3.8 Low   6.0  9.7  5.6  8.6   ?RBC 4.22 - 5.81 MIL/uL 3.83 Low   3.96 Low   4.09 Low   4.41  5.02  5.13 R  4.72   ?Hemoglobin 13.0 - 17.0 g/dL 12.8 Low   13.3  13.8  14.7  17.1 High   16.6  15.2   ?HCT 39.0 - 52.0 % 37.4 Low   38.2 Low   38.0 Low   39.7  47.2  49.0  44.0   ?MCV 80.0 - 100.0  fL 97.7  96.5  92.9  90.0  94.0  95.4 R  93.2   ?MCH 26.0 - 34.0 pg 33.4  33.6  33.7  33.3  34.1 High     32.2   ?MCHC 30.0 - 36.0 g/dL 34.2  34.8  36.3 High   37.0 High   36.2 High   34.0  34.5   ?RDW 11.5 - 15.5 % 14.6  14.6  14.3  14.2  13.1  13.7  13.1   ?Platelets 150 - 400 K/uL 90 Low   91 Low  CM  101 Low  CM  124  Low   282  195.0 R  217   ?  ?Component Ref Range & Units 3 wk ago ?(05/03/21) 3 wk ago ?(05/02/21) 3 wk ago ?(05/01/21) 4 wk ago ?(04/30/21) 4 wk ago ?(04/30/21) 4 wk ago ?(04/30/21) 4 mo ago ?(01/14/21)  ?Sodium 135 - 145 mmol/L 138  139 CM  132 Low   127 Low     126 Low  CM  140 R   ?Potassium 3.5 - 5.1 mmol/L 3.4 Low   4.4  2.9 Low   3.3 Low     3.1 Low   3.6 R   ?Chloride 98 - 111 mmol/L 96 Low   95 Low   88 Low   83 Low     81 Low   102 R   ?CO2 22 - 32 mmol/L '29  31  29  24    20 '$ Low   29 R   ?Glucose, Bld 70 - 99 mg/dL 92  92 CM  103 High  CM  116 High  CM    127 High  CM  88   ?Comment: Glucose reference range applies only to samples taken after fasting for at least 8 hours.  ?BUN 8 - 23 mg/dL 23  13  35 High   35 High     35 High   12 R   ?Creatinine, Ser 0.61 - 1.24 mg/dL 1.61 High   2.14 High   2.90 High   3.47 High     3.62 High   0.87 R   ?Calcium 8.9 - 10.3 mg/dL 8.6 Low   8.1 Low   7.2 Low   7.0 Low     7.4 Low   9.6 R   ?Total Protein 6.5 - 8.1 g/dL 7.2  6.9  7.4    7.8  7.8  6.9 R   ?Albumin 3.5 - 5.0 g/dL 3.7  3.4 Low   3.9    4.1  4.3  4.4 R   ?AST 15 - 41 U/L 194 High   238 High   285 High     414 High   397 High   31 R   ?ALT 0 - 44 U/L 174 High   199 High   226 High     296 High   294 High   40 R   ?Alkaline Phosphatase 38 - 126 U/L 85  95  109    121  122  68 R   ?Total Bilirubin 0.3 - 1.2 mg/dL 2.1 High   2.9 High   3.4 High     4.1 High   3.9 High   0.8 R   ?GFR, Estimated >60 mL/min 48 Low   34 Low  CM  24 Low  CM  19 Low  CM    18 Low  CM     ?Comment: (NOTE)  ?  Calculated using the CKD-EPI Creatinine Equation (2021)   ?Anion gap 5 - '15 13  13 '$ CM  15 CM  20 High  CM    25 High  CM     ?  ?  ?  ?  ?  ?  ?Review of Systems  ?Constitutional: Negative for chills and fever.  ?Respiratory: Negative for cough.   ?  ?  ?   ?Objective:  ? Physical Exam ?Constitutional:   ?   Appearance: Normal appearance.  ?Cardiovascular:  ?   Rate and Rhythm: Normal rate and regular rhythm.  ?   Pulses: Normal pulses.  ?    Heart sounds: Normal heart sounds.  ?Pulmonary:  ?   Effort: Pulmonary effort is normal.  ?   Breath sounds: Normal breath sounds.  ?Abdominal:  ?   Palpations: Abdomen is soft.  ?   Tenderness: There is no abdominal

## 2021-06-09 ENCOUNTER — Telehealth: Payer: Self-pay | Admitting: Internal Medicine

## 2021-06-09 ENCOUNTER — Other Ambulatory Visit: Payer: Self-pay | Admitting: Student in an Organized Health Care Education/Training Program

## 2021-06-09 DIAGNOSIS — E876 Hypokalemia: Secondary | ICD-10-CM

## 2021-06-09 DIAGNOSIS — R7989 Other specified abnormal findings of blood chemistry: Secondary | ICD-10-CM

## 2021-06-09 NOTE — Telephone Encounter (Signed)
Spironolactone refilled.  NEEDS TO STOP THE POTASSIUM SUPPLEMENT AND HAVE BMET IN ONE WEEK .  LAB ORDERED  ?

## 2021-06-10 NOTE — Telephone Encounter (Signed)
Spoke with pt to let him know that medication has been refilled and that he needs to stop taking the potassium. Pt gave a verbal understanding and scheduled a lab appt in one week.  ?

## 2021-06-15 ENCOUNTER — Ambulatory Visit (INDEPENDENT_AMBULATORY_CARE_PROVIDER_SITE_OTHER): Payer: 59

## 2021-06-15 DIAGNOSIS — I10 Essential (primary) hypertension: Secondary | ICD-10-CM

## 2021-06-18 ENCOUNTER — Other Ambulatory Visit: Payer: 59

## 2021-06-19 ENCOUNTER — Other Ambulatory Visit (INDEPENDENT_AMBULATORY_CARE_PROVIDER_SITE_OTHER): Payer: 59

## 2021-06-19 DIAGNOSIS — E876 Hypokalemia: Secondary | ICD-10-CM

## 2021-06-19 DIAGNOSIS — R7989 Other specified abnormal findings of blood chemistry: Secondary | ICD-10-CM

## 2021-06-19 LAB — COMPREHENSIVE METABOLIC PANEL
ALT: 31 U/L (ref 0–53)
AST: 23 U/L (ref 0–37)
Albumin: 4.2 g/dL (ref 3.5–5.2)
Alkaline Phosphatase: 56 U/L (ref 39–117)
BUN: 20 mg/dL (ref 6–23)
CO2: 26 mEq/L (ref 19–32)
Calcium: 9.7 mg/dL (ref 8.4–10.5)
Chloride: 101 mEq/L (ref 96–112)
Creatinine, Ser: 1.04 mg/dL (ref 0.40–1.50)
GFR: 76.88 mL/min (ref >60.00)
Glucose, Bld: 80 mg/dL (ref 70–99)
Potassium: 4 mEq/L (ref 3.5–5.1)
Sodium: 135 mEq/L (ref 135–145)
Total Bilirubin: 0.9 mg/dL (ref 0.2–1.2)
Total Protein: 7.2 g/dL (ref 6.0–8.3)

## 2021-06-19 LAB — MAGNESIUM: Magnesium: 1.7 mg/dL (ref 1.5–2.5)

## 2021-06-23 ENCOUNTER — Encounter: Payer: Self-pay | Admitting: General Surgery

## 2021-06-24 ENCOUNTER — Ambulatory Visit: Payer: 59 | Admitting: Anesthesiology

## 2021-06-24 ENCOUNTER — Encounter: Payer: Self-pay | Admitting: General Surgery

## 2021-06-24 ENCOUNTER — Ambulatory Visit
Admission: RE | Admit: 2021-06-24 | Discharge: 2021-06-24 | Disposition: A | Discharge status: Home / Self Care | Payer: 59 | Attending: General Surgery | Admitting: General Surgery

## 2021-06-24 ENCOUNTER — Encounter
Admission: RE | Disposition: A | Discharge status: Home / Self Care | Payer: Self-pay | Source: Home / Self Care | Attending: General Surgery

## 2021-06-24 DIAGNOSIS — F32A Depression, unspecified: Secondary | ICD-10-CM | POA: Insufficient documentation

## 2021-06-24 DIAGNOSIS — I10 Essential (primary) hypertension: Secondary | ICD-10-CM | POA: Insufficient documentation

## 2021-06-24 DIAGNOSIS — Z87891 Personal history of nicotine dependence: Secondary | ICD-10-CM | POA: Diagnosis not present

## 2021-06-24 DIAGNOSIS — Z79899 Other long term (current) drug therapy: Secondary | ICD-10-CM | POA: Insufficient documentation

## 2021-06-24 DIAGNOSIS — Z1211 Encounter for screening for malignant neoplasm of colon: Secondary | ICD-10-CM | POA: Diagnosis present

## 2021-06-24 DIAGNOSIS — K5730 Diverticulosis of large intestine without perforation or abscess without bleeding: Secondary | ICD-10-CM | POA: Insufficient documentation

## 2021-06-24 DIAGNOSIS — J449 Chronic obstructive pulmonary disease, unspecified: Secondary | ICD-10-CM | POA: Insufficient documentation

## 2021-06-24 DIAGNOSIS — Z8616 Personal history of COVID-19: Secondary | ICD-10-CM | POA: Insufficient documentation

## 2021-06-24 DIAGNOSIS — K219 Gastro-esophageal reflux disease without esophagitis: Secondary | ICD-10-CM | POA: Insufficient documentation

## 2021-06-24 DIAGNOSIS — D123 Benign neoplasm of transverse colon: Secondary | ICD-10-CM | POA: Diagnosis not present

## 2021-06-24 DIAGNOSIS — Z86010 Personal history of colonic polyps: Secondary | ICD-10-CM | POA: Diagnosis not present

## 2021-06-24 HISTORY — DX: Acute maxillary sinusitis, unspecified: J01.00

## 2021-06-24 HISTORY — DX: Hyperlipidemia, unspecified: E78.5

## 2021-06-24 HISTORY — PX: COLONOSCOPY WITH PROPOFOL: SHX5780

## 2021-06-24 SURGERY — COLONOSCOPY WITH PROPOFOL
Anesthesia: General

## 2021-06-24 MED ORDER — SODIUM CHLORIDE 0.9 % IV SOLN: INTRAVENOUS | Status: DC

## 2021-06-24 MED ORDER — PROPOFOL 10 MG/ML IV BOLUS
INTRAVENOUS | Status: DC | PRN
  Administered 2021-06-24: 30 mg via INTRAVENOUS
  Administered 2021-06-24 (×2): 40 mg via INTRAVENOUS
  Administered 2021-06-24: 30 mg via INTRAVENOUS
  Administered 2021-06-24 (×3): 40 mg via INTRAVENOUS
  Administered 2021-06-24: 110 mg via INTRAVENOUS

## 2021-06-24 MED ORDER — PHENYLEPHRINE 80 MCG/ML (10ML) SYRINGE FOR IV PUSH (FOR BLOOD PRESSURE SUPPORT)
PREFILLED_SYRINGE | INTRAVENOUS | Status: DC | PRN
Start: 2021-06-24 — End: 2021-06-24
  Administered 2021-06-24: 80 ug via INTRAVENOUS

## 2021-06-24 NOTE — Anesthesia Preprocedure Evaluation (Signed)
Anesthesia Evaluation  ?Patient identified by MRN, date of birth, ID band ?Patient awake ? ? ? ?Reviewed: ?Allergy & Precautions, NPO status , Patient's Chart, lab work & pertinent test results ? ?History of Anesthesia Complications ?Negative for: history of anesthetic complications ? ?Airway ?Mallampati: II ? ?TM Distance: >3 FB ?Neck ROM: Full ? ? ? Dental ?no notable dental hx. ?(+) Teeth Intact ?  ?Pulmonary ?neg sleep apnea, COPD, Patient abstained from smoking.Not current smoker,  ?  ?Pulmonary exam normal ?breath sounds clear to auscultation ? ? ? ? ? ? Cardiovascular ?Exercise Tolerance: Good ?METShypertension, Pt. on medications ?(-) CAD and (-) Past MI (-) dysrhythmias  ?Rhythm:Regular Rate:Normal ?- Systolic murmurs ? ?  ?Neuro/Psych ?PSYCHIATRIC DISORDERS Depression negative neurological ROS ?   ? GI/Hepatic ?GERD  Controlled and Medicated,(+)  ?  ? (-) substance abuse ? , Hepatitis -  ?Endo/Other  ?neg diabetes ? Renal/GU ?negative Renal ROS  ? ?  ?Musculoskeletal ? ? Abdominal ?  ?Peds ? Hematology ?  ?Anesthesia Other Findings ?Past Medical History: ?No date: Acute maxillary sinusitis ?No date: Acute upper respiratory infections of unspecified site ?10/2019: COVID-19 virus infection ?No date: Edema ?No date: Esophageal reflux ?No date: Essential hypertension, benign ?No date: External hemorrhoids without mention of complication ?No date: HLD (hyperlipidemia) ?No date: Other and unspecified hyperlipidemia ?No date: Other malaise and fatigue ?No date: Sinusitis, acute, maxillary ?No date: Spontaneous pneumothorax ?    Comment:  bilateral s/p surgery/pleurodesis [facility]  ?2006: Treadmill stress test negative for angina pectoris ?    Comment:  [facility] ? Reproductive/Obstetrics ? ?  ? ? ? ? ? ? ? ? ? ? ? ? ? ?  ?  ? ? ? ? ? ? ? ? ?Anesthesia Physical ?Anesthesia Plan ? ?ASA: 2 ? ?Anesthesia Plan: General  ? ?Post-op Pain Management: Minimal or no pain anticipated  ? ?Induction:  Intravenous ? ?PONV Risk Score and Plan: 2 and Propofol infusion, TIVA and Ondansetron ? ?Airway Management Planned: Nasal Cannula ? ?Additional Equipment: None ? ?Intra-op Plan:  ? ?Post-operative Plan:  ? ?Informed Consent: I have reviewed the patients History and Physical, chart, labs and discussed the procedure including the risks, benefits and alternatives for the proposed anesthesia with the patient or authorized representative who has indicated his/her understanding and acceptance.  ? ? ? ?Dental advisory given ? ?Plan Discussed with: CRNA and Surgeon ? ?Anesthesia Plan Comments: (Discussed risks of anesthesia with patient, including possibility of difficulty with spontaneous ventilation under anesthesia necessitating airway intervention, PONV, and rare risks such as cardiac or respiratory or neurological events, and allergic reactions. Discussed the role of CRNA in patient's perioperative care. Patient understands.)  ? ? ? ? ? ? ?Anesthesia Quick Evaluation ? ?
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
## 2021-06-24 NOTE — Transfer of Care (Signed)
Immediate Anesthesia Transfer of Care Note ? ?Patient: Jonathan Simon ? ?Procedure(s) Performed: COLONOSCOPY WITH PROPOFOL ? ?Patient Location: PACU and Endoscopy Unit ? ?Anesthesia Type:General ? ?Level of Consciousness: drowsy ? ?Airway & Oxygen Therapy: Patient Spontanous Breathing ? ?Post-op Assessment: Report given to RN ? ?Post vital signs: stable ? ?Last Vitals:  ?Vitals Value Taken Time  ?BP    ?Temp    ?Pulse 87 06/24/21 0932  ?Resp 11 06/24/21 0932  ?SpO2 97 % 06/24/21 0932  ?Vitals shown include unvalidated device data. ? ?Last Pain:  ?Vitals:  ? 06/24/21 0835  ?TempSrc: Temporal  ?PainSc: 0-No pain  ?   ? ?  ? ?Complications: No notable events documented. ?

## 2021-06-24 NOTE — Anesthesia Postprocedure Evaluation (Signed)
Anesthesia Post Note ? ?Patient: Jonathan Simon ? ?Procedure(s) Performed: COLONOSCOPY WITH PROPOFOL ? ?Patient location during evaluation: Endoscopy ?Anesthesia Type: General ?Level of consciousness: awake and alert ?Pain management: pain level controlled ?Vital Signs Assessment: post-procedure vital signs reviewed and stable ?Respiratory status: spontaneous breathing, nonlabored ventilation, respiratory function stable and patient connected to nasal cannula oxygen ?Cardiovascular status: blood pressure returned to baseline and stable ?Postop Assessment: no apparent nausea or vomiting ?Anesthetic complications: no ? ? ?No notable events documented. ? ? ?Last Vitals:  ?Vitals:  ? 06/24/21 0941 06/24/21 0951  ?BP:  124/81  ?Pulse: 82 77  ?Resp:    ?Temp:    ?SpO2: 100% 100%  ?  ?Last Pain:  ?Vitals:  ? 06/24/21 0951  ?TempSrc:   ?PainSc: 0-No pain  ? ? ?  ?  ?  ?  ?  ?  ? ?Arita Miss ? ? ? ? ?

## 2021-06-24 NOTE — Op Note (Signed)
[facility] ?Gastroenterology ?Patient Name: Jonathan Simon ?Procedure Date: 06/24/2021 8:52 AM ?MRN: [id number] ?Account #: [id number] ?Date of Birth: [birth year] ?Admit Type: Outpatient ?Age: 61 ?Room: [facility] ENDO ROOM 1 ?Gender: Male ?Note Status: Finalized ?Instrument Name: Peds Colonoscope [id number] ?Procedure:             Colonoscopy ?Indications:           High risk colon cancer surveillance: Personal history  ?                       of colonic polyps ?Providers:             Robert Bellow, MD ?Referring MD:          Deborra Medina, MD (Referring MD) ?Medicines:             Propofol per Anesthesia ?Complications:         No immediate complications. ?Procedure:             Pre-Anesthesia Assessment: ?                       - Prior to the procedure, a History and Physical was  ?                       performed, and patient medications, allergies and  ?                       sensitivities were reviewed. The patient's tolerance  ?                       of previous anesthesia was reviewed. ?                       - The risks and benefits of the procedure and the  ?                       sedation options and risks were discussed with the  ?                       patient. All questions were answered and informed  ?                       consent was obtained. ?                       After obtaining informed consent, the colonoscope was  ?                       passed under direct vision. Throughout the procedure,  ?                       the patient's blood pressure, pulse, and oxygen  ?                       saturations were monitored continuously. The  ?                       Colonoscope was introduced through the anus and  ?                       advanced  to the the terminal ileum. The colonoscopy  ?                       was somewhat difficult due to a tortuous colon. The  ?                       patient tolerated the procedure well. The quality of  ?                       the bowel preparation was  excellent. ?Findings: ?     A 5 mm polyp was found in the splenic flexure. The polyp was sessile.  ?     The polyp was removed with a hot snare. Resection and retrieval were  ?     complete. ?     Multiple small-mouthed diverticula were found in the sigmoid colon and  ?     ascending colon. ?     The retroflexed view of the distal rectum and anal verge was normal and  ?     showed no anal or rectal abnormalities. ?Impression:            - One 5 mm polyp at the splenic flexure, removed with  ?                       a hot snare. Resected and retrieved. ?                       - Diverticulosis in the sigmoid colon and in the  ?                       ascending colon. ?                       - The distal rectum and anal verge are normal on  ?                       retroflexion view. ?Recommendation:        - Telephone endoscopist for pathology results in 1  ?                       week. ?Procedure Code(s):     --- Professional --- ?                       [phone], Colonoscopy, flexible; with removal of  ?                       tumor(s), polyp(s), or other lesion(s) by snare  ?                       technique ?Diagnosis Code(s):     --- Professional --- ?                       Z86.010, Personal history of colonic polyps ?                       K63.5, Polyp of colon ?                       K57.30, Diverticulosis of large intestine without  ?  perforation or abscess without bleeding ?CPT copyright 2019 [employer]. All rights reserved. ?The codes documented in this report are preliminary and upon coder review may  ?be revised to meet current compliance requirements. ?Robert Bellow, MD ?06/24/2021 9:31:37 AM ?This report has been signed electronically. ?Number of Addenda: 0 ?Note Initiated On: 06/24/2021 8:52 AM ?Scope Withdrawal Time: 0 hours 9 minutes 27 seconds  ?Total Procedure Duration: 0 hours 24 minutes 59 seconds  ?Estimated Blood Loss:  Estimated blood loss: none. ?     [facility] ?

## 2021-06-25 ENCOUNTER — Encounter: Payer: Self-pay | Admitting: General Surgery

## 2021-06-25 LAB — SURGICAL PATHOLOGY

## 2021-07-13 ENCOUNTER — Ambulatory Visit (INDEPENDENT_AMBULATORY_CARE_PROVIDER_SITE_OTHER): Payer: 59 | Admitting: Family

## 2021-07-13 ENCOUNTER — Telehealth: Payer: Self-pay | Admitting: Internal Medicine

## 2021-07-13 ENCOUNTER — Telehealth: Payer: Self-pay | Admitting: *Deleted

## 2021-07-13 ENCOUNTER — Encounter: Payer: Self-pay | Admitting: Family

## 2021-07-13 VITALS — BP 82/48 | HR 65 | Ht 70.0 in | Wt 199.4 lb

## 2021-07-13 DIAGNOSIS — I10 Essential (primary) hypertension: Secondary | ICD-10-CM | POA: Diagnosis not present

## 2021-07-13 DIAGNOSIS — R432 Parageusia: Secondary | ICD-10-CM | POA: Diagnosis not present

## 2021-07-13 DIAGNOSIS — I951 Orthostatic hypotension: Secondary | ICD-10-CM | POA: Diagnosis not present

## 2021-07-13 DIAGNOSIS — F321 Major depressive disorder, single episode, moderate: Secondary | ICD-10-CM

## 2021-07-13 DIAGNOSIS — F1010 Alcohol abuse, uncomplicated: Secondary | ICD-10-CM

## 2021-07-13 HISTORY — DX: Parageusia: R43.2

## 2021-07-13 LAB — BASIC METABOLIC PANEL
BUN: 94 mg/dL (ref 6–23)
CO2: 24 mEq/L (ref 19–32)
Calcium: 9.6 mg/dL (ref 8.4–10.5)
Chloride: 87 mEq/L — ABNORMAL LOW (ref 96–112)
Creatinine, Ser: 2.21 mg/dL — ABNORMAL HIGH (ref 0.40–1.50)
GFR: 31.1 mL/min — ABNORMAL LOW (ref >60.00)
Glucose, Bld: 99 mg/dL (ref 70–99)
Potassium: 3.7 mEq/L (ref 3.5–5.1)
Sodium: 126 mEq/L — ABNORMAL LOW (ref 135–145)

## 2021-07-13 MED ORDER — SPIRONOLACTONE 25 MG PO TABS
25.0000 mg | ORAL_TABLET | Freq: Every day | ORAL | 3 refills | Status: DC
Start: 2021-07-13 — End: 2021-07-13

## 2021-07-13 MED ORDER — SERTRALINE HCL 100 MG PO TABS: 100.0000 mg | ORAL_TABLET | Freq: Every day | ORAL | 1 refills | Status: DC

## 2021-07-13 NOTE — Telephone Encounter (Signed)
CRITICAL VALUE STICKER ? ?CRITICAL VALUE: BUN-94 (H) ? ?RECEIVER (on-site recipient of call): Vanesha Athens ? ?DATE & TIME NOTIFIED: 07/13/21 @ 4:48pm ? ?MESSENGER (representative from lab): Hope ? ?MD NOTIFIED: Vidal Schwalbe ? ?TIME OF NOTIFICATION: ? ?RESPONSE:   ?

## 2021-07-13 NOTE — Patient Instructions (Addendum)
Please increase Zoloft to 100 mg to help with anxiety, sleep.  ? ?Please be very careful with position changes and first and foremost we need to confirm if you are on the spironolactone.  Please call me this afternoon as soon as you are home and let me know if you are taking spironolactone or not. ? ?Referral to ENT for abnormal taste ?Let us know if you dont hear back within a week in regards to an appointment being scheduled.  ? ? ? ?

## 2021-07-13 NOTE — Assessment & Plan Note (Signed)
Remains sober since 03/2021. Continue to follow with AA and have AA sponsor.  ?

## 2021-07-13 NOTE — Telephone Encounter (Signed)
Pt was taking the spironolactone and took the last one this morning. Pt does not know if he should stop taking it or not ?

## 2021-07-13 NOTE — Telephone Encounter (Signed)
Thanks Dr Derrel Nip; I spoke with pt ? ?He will decrease spironolactone from '25mg'$  to '50mg'$ .  ?Monitor for dizziness and let me know if doesn't improve ? ?Judson Roch, will you create a new work note extended through 07/22/21 and leave it up front for pt to pick up.  ? ?I will see him again next to monitor blood pressure; scheduled 07/20/21 ? ? ?

## 2021-07-13 NOTE — Assessment & Plan Note (Addendum)
Profoundly orthostatic on exam today.  Patient has had recent weight  Loss ~12lbs  and perhaps this has contributed to low blood pressure.   As he is very symptomatic, advised him that we would most certainly decrease the antihypertensive regimen.  He is unsure if he is on spironolactone 50 mg.  He will confirm this this afternoon once he gets home so I may adjust regimen. ?Advised to remain out of work for his safety until symptom resolved.  ?Addendum: Patient called to confirm that he is on Spironolactone '50mg'$ , amlodipine 10 mg, hydrochlorothiazide 25 mg, losartan 100 mg, metoprolol succinate 25 mg.  Labs reveal critical elevated BUN elevated creatinine.  Consulted with Dr Derrel Nip (revising physician and I jointly agreed to suspend hydrochlorothiazide and spironolactone.  Labs to be rechecked 07/17/2021 ?Lab Results  ?Component Value Date  ? BUN 94 (HH) 07/13/2021  ? ? ?

## 2021-07-13 NOTE — Assessment & Plan Note (Signed)
Insomnia and anxiety are uncontrolled.  Advised to increase Zoloft from 50 to 100 mg.  Advised trazodone could be later added back on and titrated up for effect if needed.  Close follow-up ?

## 2021-07-13 NOTE — Assessment & Plan Note (Addendum)
No known etiology for this symptom.  Onset 5 days ago.  No recent URI.  No obvious GERD symptoms.  No head trauma.  Advised this may be transient in nature particularly if he has encountered a virus with very mild or no symptoms and symptom may resolve on its own.  If symptom does not resolve, I recommended a referral for further evaluation to ENT.  I have  placed this referral.  Referral to ENT.  Advised patient to let me know how he is doing ?
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
## 2021-07-14 NOTE — Telephone Encounter (Signed)
Spoke to patient and scheduled lab appt for 07/17/21 @ 10:15 ?

## 2021-07-14 NOTE — Telephone Encounter (Signed)
I called pt and informed of hyponatremia, elevated BUN, increased creatinine which I am very concerned is related to dehydration most likely contributing to his dizziness.  Advised after speaking with Dr Derrel Nip to suspend spironolactone and hydrochlorothiazide.  Also advised if he is taking any potassium supplement (it is not listed on his medication list) that he may need to stop that as well.   ?He verbalized understanding. ?CMP ordered ? ?Jenate, please call and sch lab for pt on 07/17/21 early MORNING. I ordered.  ? ?I will see him 07/20/21 ?

## 2021-07-14 NOTE — Telephone Encounter (Signed)
Patient notified

## 2021-07-17 ENCOUNTER — Other Ambulatory Visit (INDEPENDENT_AMBULATORY_CARE_PROVIDER_SITE_OTHER): Payer: 59

## 2021-07-17 DIAGNOSIS — I10 Essential (primary) hypertension: Secondary | ICD-10-CM

## 2021-07-17 LAB — COMPREHENSIVE METABOLIC PANEL
ALT: 26 U/L (ref 0–53)
AST: 20 U/L (ref 0–37)
Albumin: 3.9 g/dL (ref 3.5–5.2)
Alkaline Phosphatase: 64 U/L (ref 39–117)
BUN: 16 mg/dL (ref 6–23)
CO2: 28 mEq/L (ref 19–32)
Calcium: 9.2 mg/dL (ref 8.4–10.5)
Chloride: 103 mEq/L (ref 96–112)
Creatinine, Ser: 0.85 mg/dL (ref 0.40–1.50)
GFR: 92.98 mL/min (ref >60.00)
Glucose, Bld: 80 mg/dL (ref 70–99)
Potassium: 3.7 mEq/L (ref 3.5–5.1)
Sodium: 140 mEq/L (ref 135–145)
Total Bilirubin: 0.8 mg/dL (ref 0.2–1.2)
Total Protein: 6.5 g/dL (ref 6.0–8.3)

## 2021-07-20 ENCOUNTER — Ambulatory Visit: Payer: 59 | Admitting: Family

## 2021-07-24 ENCOUNTER — Telehealth: Payer: Self-pay

## 2021-07-24 NOTE — Telephone Encounter (Signed)
Patient states he saw Mable Paris, NP, last week and his blood pressure was up and down.  Patient states Mable Paris, NP, wrote a work note for him to be out of work from 07/13/2021 - 07/21/2021.  Patient states he also had to be out of work from 07/22/2021 - 07/23/2021 and would like for Mable Paris, NP, to fax another note to his employer to cover him.  Patient states we should fax the letter to [facility].  Patient states Nita's phone number is [phone] and her fax number is [phone].

## 2021-07-24 NOTE — Telephone Encounter (Signed)
I spoke with patient & let him know I would extend note & fax.  I also let him know that he was a no show to follow-up with Joycelyn Schmid. He apologized & did not realize that he was even scheduled that day he stated. I have rescheduled for a quick 15 minute follow up with Alveria Apley. 5/31.

## 2021-07-29 ENCOUNTER — Ambulatory Visit (INDEPENDENT_AMBULATORY_CARE_PROVIDER_SITE_OTHER): Payer: 59 | Admitting: Family

## 2021-07-29 ENCOUNTER — Encounter: Payer: Self-pay | Admitting: Family

## 2021-07-29 VITALS — BP 128/82 | HR 72 | Temp 95.8°F | Ht 70.0 in | Wt 202.6 lb

## 2021-07-29 DIAGNOSIS — F321 Major depressive disorder, single episode, moderate: Secondary | ICD-10-CM | POA: Diagnosis not present

## 2021-07-29 DIAGNOSIS — R42 Dizziness and giddiness: Secondary | ICD-10-CM

## 2021-07-29 LAB — URINALYSIS, ROUTINE W REFLEX MICROSCOPIC
Bilirubin Urine: NEGATIVE
Hgb urine dipstick: NEGATIVE
Ketones, ur: 40 — AB
Leukocytes,Ua: NEGATIVE
Nitrite: NEGATIVE
RBC / HPF: NONE SEEN (ref 0–?)
Specific Gravity, Urine: 1.025 (ref 1.000–1.030)
Urine Glucose: NEGATIVE
Urobilinogen, UA: 1 (ref 0.0–1.0)
pH: 6 (ref 5.0–8.0)

## 2021-07-29 LAB — CBC WITH DIFFERENTIAL/PLATELET
Basophils Absolute: 0 10*3/uL (ref 0.0–0.1)
Basophils Relative: 0.8 % (ref 0.0–3.0)
Eosinophils Absolute: 0 10*3/uL (ref 0.0–0.7)
Eosinophils Relative: 0.3 % (ref 0.0–5.0)
HCT: 38 % — ABNORMAL LOW (ref 39.0–52.0)
Hemoglobin: 13 g/dL (ref 13.0–17.0)
Lymphocytes Relative: 21.8 % (ref 12.0–46.0)
Lymphs Abs: 1.3 10*3/uL (ref 0.7–4.0)
MCHC: 34.3 g/dL (ref 30.0–36.0)
MCV: 97.8 fl (ref 78.0–100.0)
Monocytes Absolute: 0.3 10*3/uL (ref 0.1–1.0)
Monocytes Relative: 5 % (ref 3.0–12.0)
Neutro Abs: 4.3 10*3/uL (ref 1.4–7.7)
Neutrophils Relative %: 72.1 % (ref 43.0–77.0)
Platelets: 235 10*3/uL (ref 150.0–400.0)
RBC: 3.88 Mil/uL — ABNORMAL LOW (ref 4.22–5.81)
RDW: 15.3 % (ref 11.5–15.5)
WBC: 6 10*3/uL (ref 4.0–10.5)

## 2021-07-29 LAB — IBC + FERRITIN
Ferritin: >1500 ng/mL — ABNORMAL HIGH (ref 22.0–322.0)
Iron: 175 ug/dL — ABNORMAL HIGH (ref 42–165)
Saturation Ratios: 52.7 % — ABNORMAL HIGH (ref 20.0–50.0)
TIBC: 331.8 ug/dL (ref 250.0–450.0)
Transferrin: 237 mg/dL (ref 212.0–360.0)

## 2021-07-29 LAB — TSH: TSH: 0.26 u[IU]/mL — ABNORMAL LOW (ref 0.35–5.50)

## 2021-07-29 LAB — B12 AND FOLATE PANEL
Folate: 6.4 ng/mL (ref >5.9)
Vitamin B-12: 468 pg/mL (ref 211–911)

## 2021-07-29 LAB — PSA: PSA: 1.15 ng/mL (ref 0.10–4.00)

## 2021-07-29 MED ORDER — SERTRALINE HCL 50 MG PO TABS: 50.0000 mg | ORAL_TABLET | Freq: Every day | ORAL | 3 refills | Status: DC

## 2021-07-29 NOTE — Patient Instructions (Addendum)
Discontinue metoprolol succinate due to dizziness.  I have ordered MRI of the brain, echocardiogram the heart and ultrasound of carotid arteries.  I have also placed a referral to nutritionist. Let us know if you dont hear back within a week in regards to an appointment being scheduled.   Please ensure that you are drinking small frequent sips of water due to the vomiting in the office today.  Please ensure you take all medications with food on your stomach.  If vomiting were to continue them or certainly were to see blood in vomit or develop abdominal pain or fever, please call the office and let us know right away as we may recommend an evaluation in the emergency room  I would like to see you again in 1 week

## 2021-07-29 NOTE — Assessment & Plan Note (Addendum)
Uncontrolled.   Increase Zoloft 150 mg. Consider adjunct with remeron to increase appetite at follow-up. close follow up.

## 2021-07-29 NOTE — Progress Notes (Signed)
Subjective:    Patient ID: Jonathan Simon, male    DOB: April 10, 1960, 61 y.o.   MRN: [id number]  CC: [name] is a 61 y.o. male who presents today for follow up.   HPI: Accompanied by brother He feels that he is more confused. Brother feels that he just doesn't seem like himself. He feels more depressed and anxious.   He felt on his bottom last week inside the home. No injury.  Reports LOC.  Not witnessed. No loss urinating.  Felt dizzy prior syncope.  No associated CP, diaphoresis prior to syncope.  He has not eaten for 2-3 days which is typical for him. Drinking some water. Urine is very yellow with odor. No dysuria, fever, vision changes, HA.  He hasnt forgotten names or gotten lost.  He remains sober. He speaks to sponsor on the phone once per month.  He is no longer on spironolactone '50mg'$ , hydrochlorothiazide '25mg'$ . He is compliant with amlodipine 10 mg daily, losartan 100 mg daily, metoprolol succinate 25 mg daily At home BP 90/70, 107/71.    He has gained 3 lbs  Increased Zoloft 100 mg which has helpful for falling asleep he did this morning however will not stay asleep. Endorses fatigue. NO si/hi HISTORY:  Past Medical History:  Diagnosis Date   Acute maxillary sinusitis    Acute upper respiratory infections of unspecified site    COVID-19 virus infection 10/2019   Edema    Esophageal reflux    Essential hypertension, benign    External hemorrhoids without mention of complication    HLD (hyperlipidemia)    Other and unspecified hyperlipidemia    Other malaise and fatigue    Sinusitis, acute, maxillary    Spontaneous pneumothorax    bilateral s/p surgery/pleurodesis [facility]    Treadmill stress test negative for angina pectoris 2006   [facility]   Past Surgical History:  Procedure Laterality Date   COLONOSCOPY WITH PROPOFOL N/A 06/25/2016   Procedure: COLONOSCOPY WITH PROPOFOL;  Surgeon: Robert Bellow, MD;  Location: [facility];  Service: Endoscopy;   Laterality: N/A;   COLONOSCOPY WITH PROPOFOL N/A 06/24/2021   Procedure: COLONOSCOPY WITH PROPOFOL;  Surgeon: Robert Bellow, MD;  Location: [facility];  Service: Endoscopy;  Laterality: N/A;   [place]   with loop colostomy secondary to lightning rod impalement at [place] N/A 10/02/2019   Procedure: [place], umbilical;  Surgeon: Olean Ree, MD;  Location: [facility] ORS;  Service: General;  Laterality: N/A;   INGUINAL HERNIA REPAIR Right 2012   Open, with mesh, Dr. Leanora Cover   LAPAROSCOPIC INGUINAL HERNIA REPAIR Left    before 2012   THORACOTOMY Right 1987   with blebectomy and pleurodesis   THORACOTOMY Left 1997   with blebectomy and pleurodesis   XI ROBOTIC ASSISTED INGUINAL HERNIA REPAIR WITH MESH Right 10/02/2019   Procedure: XI ROBOTIC [place];  Surgeon: Olean Ree, MD;  Location: [facility] OR[facility];  Service: General;  Laterality: Right;   Family History  Problem Relation Age of Onset   COPD Mother 75   Dementia Mother    Heart disease Father 62       Massive MI    Heart disease Paternal Grandfather    Colon cancer Neg Hx     Allergies: Latex Current Outpatient Medications on File Prior to Visit  Medication Sig Dispense  Refill   albuterol (PROVENTIL HFA;VENTOLIN HFA) 108 (90 BASE) MCG/ACT inhaler Inhale 2 puffs into the lungs every 6 (six) hours as needed for wheezing. 1 Inhaler 0   amLODipine (NORVASC) 10 MG tablet TAKE 1 TABLET BY MOUTH DAILY (Patient taking differently: Take 10 mg by mouth daily.) 90 tablet 3   losartan (COZAAR) 100 MG tablet TAKE 1 TABLET BY MOUTH DAILY (Patient taking differently: Take 100 mg by mouth daily.) 90 tablet 3   Magnesium Oxide 200 MG TABS Take 1 tablet (200 mg total) by mouth in the morning and at bedtime. 60 tablet 0   omeprazole (PRILOSEC) 20 MG capsule Take 1 capsule (20 mg total) by mouth  daily as needed.     rosuvastatin (CRESTOR) 10 MG tablet Take 1 tablet (10 mg total) by mouth daily. 90 tablet 1   sertraline (ZOLOFT) 100 MG tablet Take 1 tablet (100 mg total) by mouth daily. 90 tablet 1   simvastatin (ZOCOR) 20 MG tablet Take 20 mg by mouth daily.     promethazine (PHENERGAN) 12.5 MG tablet Take 12.5 mg by mouth every 8 (eight) hours as needed for nausea or vomiting. (Patient not taking: Reported on 07/29/2021)     tadalafil (CIALIS) 5 MG tablet Take 2.5 mg by mouth daily as needed for erectile dysfunction. (Patient not taking: Reported on 07/29/2021)     No current facility-administered medications on file prior to visit.    Social History   Tobacco Use   Smoking status: Never   Smokeless tobacco: Never  Vaping Use   Vaping Use: Never used  Substance Use Topics   Alcohol use: Not Currently    Alcohol/week: 42.0 standard drinks    Types: 42 Cans of beer per week    Comment: beer on weekends    Drug use: Never    Review of Systems  Constitutional:  Positive for fatigue. Negative for chills and fever.  Eyes:  Negative for visual disturbance.  Respiratory:  Negative for cough and shortness of breath.   Cardiovascular:  Negative for chest pain, palpitations and leg swelling.  Gastrointestinal:  Negative for nausea and vomiting.  Neurological:  Positive for dizziness and syncope. Negative for tremors.     Objective:    BP 128/82 (BP Location: Left Arm, Patient Position: Sitting, Cuff Size: Large)   Pulse 72   Temp (!) 95.8 F (35.4 C) (Oral)   Ht '5\' 10"'$  (1.778 m)   Wt 202 lb 9.6 oz (91.9 kg)   SpO2 96%   BMI 29.07 kg/m  BP Readings from Last 3 Encounters:  07/29/21 128/82  07/13/21 (!) 82/48  06/24/21 124/81   Wt Readings from Last 3 Encounters:  07/29/21 202 lb 9.6 oz (91.9 kg)  07/13/21 199 lb 6.4 oz (90.4 kg)  06/24/21 212 lb (96.2 kg)  Orthostatic VS for the past 24 hrs (Last 3 readings):  BP- Lying Pulse- Lying BP- Sitting Pulse- Sitting BP-  Standing at 0 minutes Pulse- Standing at 0 minutes  07/29/21 1050 140/82 71 144/80 71 124/64 81     Physical Exam Vitals reviewed.  Constitutional:      Appearance: He is well-developed.  HENT:     Right Ear: Hearing normal.     Left Ear: Hearing normal.     Mouth/Throat:     Pharynx: Uvula midline. No posterior oropharyngeal erythema.  Eyes:     General: Lids are normal. Lids are everted, no foreign bodies appreciated.     Conjunctiva/sclera: Conjunctivae normal.  Pupils: Pupils are equal, round, and reactive to light.     Comments: Normal fundus bilaterally.  Cardiovascular:     Rate and Rhythm: Regular rhythm.     Heart sounds: Normal heart sounds.  Pulmonary:     Effort: Pulmonary effort is normal. No respiratory distress.     Breath sounds: Normal breath sounds. No wheezing, rhonchi or rales.  Lymphadenopathy:     Head:     Right side of head: No submental, submandibular, tonsillar, preauricular, posterior auricular or occipital adenopathy.     Left side of head: No submental, submandibular, tonsillar, preauricular, posterior auricular or occipital adenopathy.     Cervical: No cervical adenopathy.  Skin:    General: Skin is warm and dry.  Neurological:     Mental Status: He is alert.     Cranial Nerves: No cranial nerve deficit.     Sensory: No sensory deficit.     Deep Tendon Reflexes:     Reflex Scores:      Bicep reflexes are 2+ on the right side and 2+ on the left side.      Patellar reflexes are 2+ on the right side and 2+ on the left side.    Comments: Grip equal and strong bilateral upper extremities. Gait strong and steady. Able to perform rapid alternating movement without difficulty. Alert and oriented x 4.   Psychiatric:        Speech: Speech normal.        Behavior: Behavior normal.       Assessment & Plan:   Problem List Items Addressed This Visit       Other   Dizziness - Primary    Syncope episode. He had prodromal symptoms leading up to  another syncopal episode last week.  BP demonstrated orthostatsis again today. Concerned for malnutrition which likely exacerbates symptom.  Reassured by normal neurologic exam today. MMSE 29/ 30 Pending MRI brain, US carotid, echocardiogram. Ethanol level,  and  labs.  Referral to nutritionist for further consult regarding malnutrition. Note: While in the office this morning, patient vomited 3 times small amounts nonbloody bile.  He did not take morning medications with food. Unclear etiology at this time.  Advised him to stay vigilant and certainly if he continues to feel nauseous or vomit today to call me and let me know.  Advised if he sees any blood in the emesis develops fever or abdominal pain to let me know right away as well Close follow up in 2 weeks.        Relevant Orders   MR BRAIN W WO CONTRAST   CBC with Differential/Platelet   TSH   RPR   B12 and Folate Panel   IBC + Ferritin   PSA   Urinalysis, Routine w reflex microscopic   Urine Culture   ECHOCARDIOGRAM COMPLETE   US Carotid Duplex Bilateral   Amb ref to Medical Nutrition Therapy-MNT   Ethanol   Major depressive disorder with current active episode    Uncontrolled.   Increase Zoloft 150 mg. Consider adjunct with remeron to increase appetite at follow-up. close follow up.        Relevant Medications   sertraline (ZOLOFT) 50 MG tablet   Other Relevant Orders   Ethanol     I have discontinued Matix J. Vuncannon's metoprolol succinate. I am also having him start on sertraline. Additionally, I am having him maintain his albuterol, amLODipine, losartan, omeprazole, rosuvastatin, Magnesium Oxide, promethazine, simvastatin, tadalafil, and sertraline.  Meds ordered this encounter  Medications   sertraline (ZOLOFT) 50 MG tablet    Sig: Take 1 tablet (50 mg total) by mouth at bedtime. Take with '100mg'$  tablet at bedtime for total of '150mg'$ .    Dispense:  90 tablet    Refill:  3    Order Specific Question:    Supervising Provider    Answer:   Crecencio Mc [[id number]]    Return precautions given.   Risks, benefits, and alternatives of the medications and treatment plan prescribed today were discussed, and patient expressed understanding.   Education regarding symptom management and diagnosis given to patient on AVS.  Continue to follow with Crecencio Mc, MD for routine health maintenance.   Thaddaeus Soyla Murphy and I agreed with plan.   Mable Paris, FNP

## 2021-07-29 NOTE — Addendum Note (Signed)
Addended by: Cheri Rous E on: 07/29/2021 11:44 AM   Modules accepted: Orders

## 2021-07-29 NOTE — Assessment & Plan Note (Addendum)
Syncope episode. He had prodromal symptoms leading up to another syncopal episode last week.  BP demonstrated orthostatsis again today. Concerned for malnutrition which likely exacerbates symptom.  Reassured by normal neurologic exam today. MMSE 29/ 30 Pending MRI brain, US carotid, echocardiogram. Ethanol level,  and  labs.  Referral to nutritionist for further consult regarding malnutrition. Note: While in the office this morning, patient vomited 3 times small amounts nonbloody bile.  He did not take morning medications with food. Unclear etiology at this time.  Advised him to stay vigilant and certainly if he continues to feel nauseous or vomit today to call me and let me know.  Advised if he sees any blood in the emesis develops fever or abdominal pain to let me know right away as well Close follow up in 2 weeks.

## 2021-07-30 ENCOUNTER — Ambulatory Visit
Admission: RE | Admit: 2021-07-30 | Discharge: 2021-07-30 | Disposition: A | Discharge status: Home / Self Care | Payer: 59 | Source: Ambulatory Visit | Attending: Family | Admitting: Family

## 2021-07-30 DIAGNOSIS — R42 Dizziness and giddiness: Secondary | ICD-10-CM | POA: Insufficient documentation

## 2021-07-30 LAB — URINE CULTURE
MICRO NUMBER:: 13464749
SPECIMEN QUALITY:: ADEQUATE

## 2021-07-30 LAB — RPR: RPR Ser Ql: NONREACTIVE

## 2021-07-30 IMAGING — MR MR HEAD WO/W CM
14 series · 48 of 48 positions shown · IV contrast (gadavist)
Comparison: None Available.

CLINICAL DATA: Dizziness, persistent/recurrent, cardiac or vascular
cause suspected

EXAM:
MRI HEAD WITHOUT AND WITH CONTRAST
TECHNIQUE: Multiplanar, multiecho pulse sequences of the brain and surrounding
structures were obtained without and with intravenous contrast.
CONTRAST:  9mL GADAVIST GADOBUTROL 1 MMOL/ML IV SOLN

[Series 5: ax dwi_tracew · axial · 3.0mm · 0.65mm/px · z∈[-77,+76]mm · 4 of 48 slices shown]
[im 1/48]
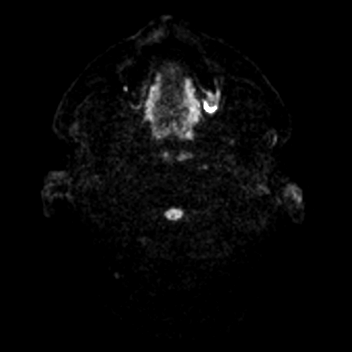
[im 16/48]
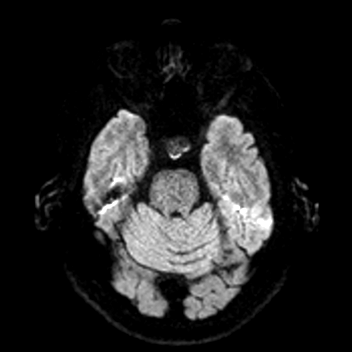
[im 32/48]
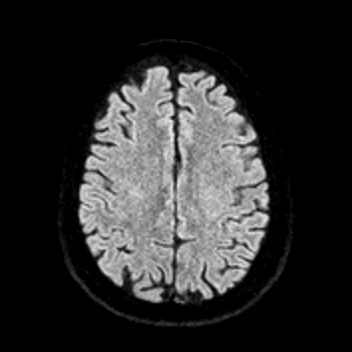
[im 48/48]
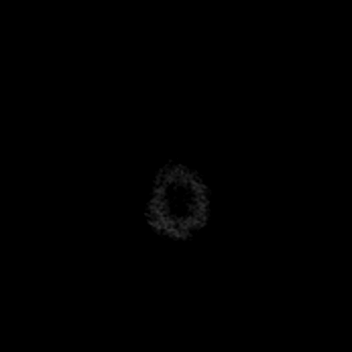

[Series 6: ax dwi_adc · axial · 3.0mm · 0.65mm/px · z∈[-77,+76]mm · 4 of 48 slices shown]
[im 1/48]
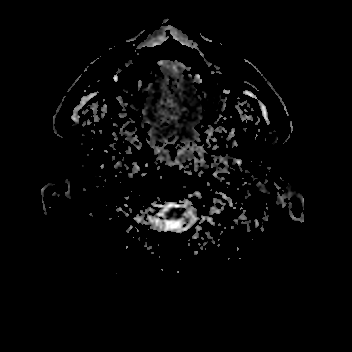
[im 16/48]
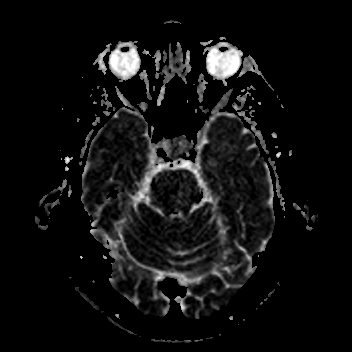
[im 32/48]
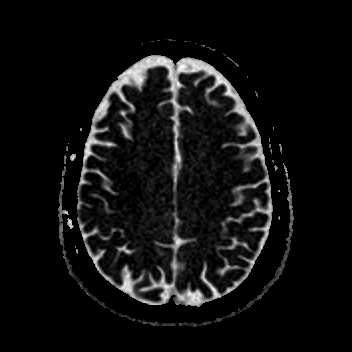
[im 48/48]
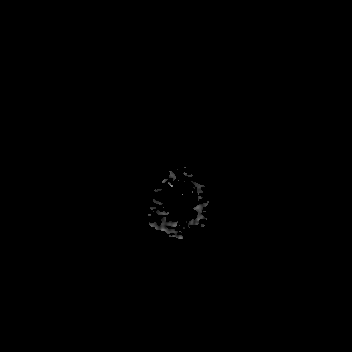

[Series 7: cor dwi_tracew · coronal · 5.0mm · 1.80mm/px · 2 of 40 slices shown]
[im 1/40]
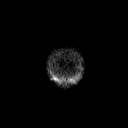
[im 40/40]
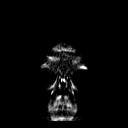

[Series 8: cor dwi_adc · coronal · 5.0mm · 1.80mm/px · 2 of 40 slices shown]
[im 1/40]
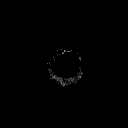
[im 40/40]
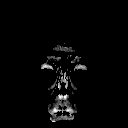

[Series 9: T1 · sagittal · 5.0mm · 0.62mm/px · 1 of 25 slices shown (1 of 2)]
[im 1/25]
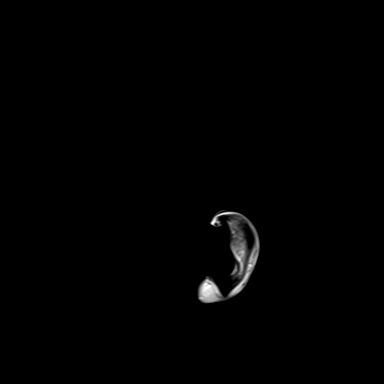

[Series 10: T2 · axial · 5.0mm · 0.53mm/px · 1 of 26 slices shown]
[im 1/26]
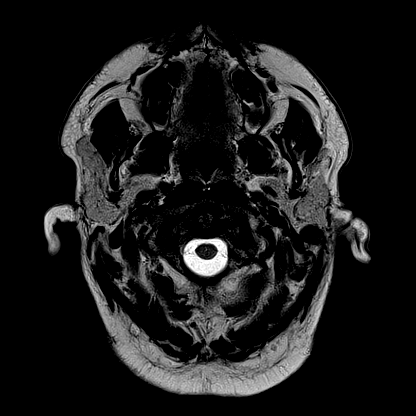

[Series 12: pha_images · axial · 3.0mm · 0.90mm/px · z∈[-87,+88]mm · 3 of 60 slices shown]
[im 1/60]
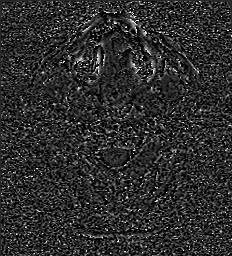
[im 30/60]
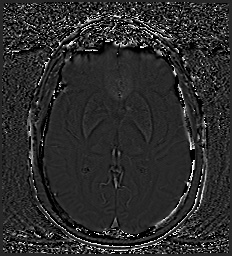
[im 60/60]
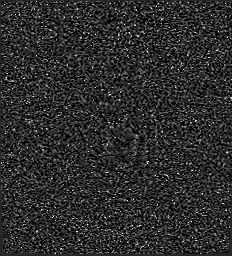

[Series 13: swi_images · axial · 3.0mm · 0.90mm/px · z∈[-87,+88]mm · 3 of 60 slices shown]
[im 1/60]
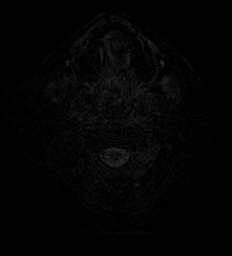
[im 30/60]
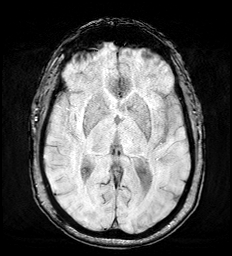
[im 60/60]
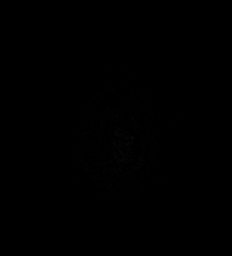

[Series 15: FLAIR · axial · 3.0mm · 0.53mm/px · z∈[-81,+79]mm · 3 of 55 slices shown]
[im 1/55]
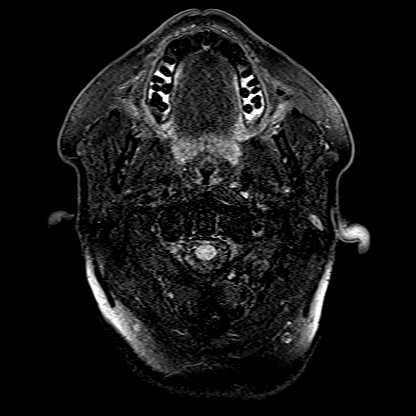
[im 28/55]
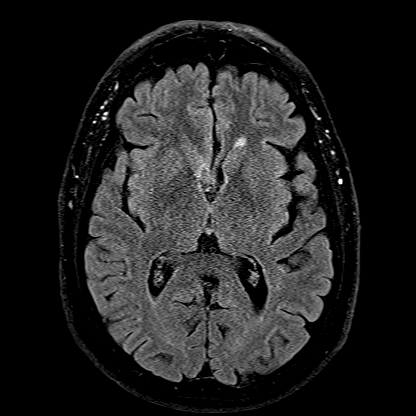
[im 55/55]
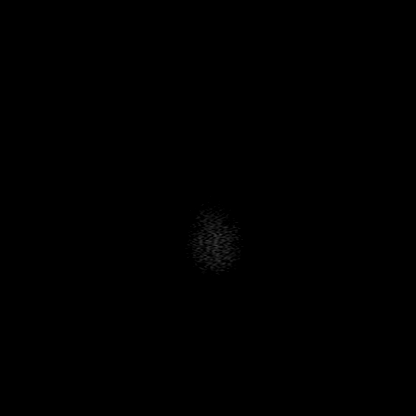

[Series 16: T1 · axial · 1.0mm · 0.98mm/px · z∈[-85,+88]mm · 10 of 176 slices shown (2 of 2)]
[im 1/176]
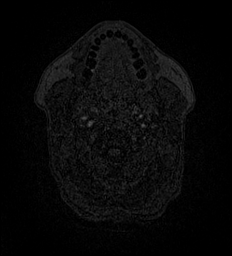
[im 20/176]
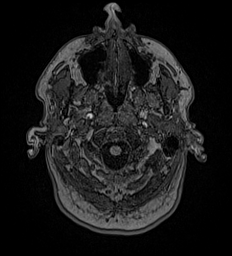
[im 39/176]
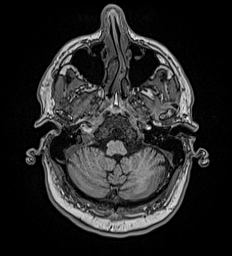
[im 59/176]
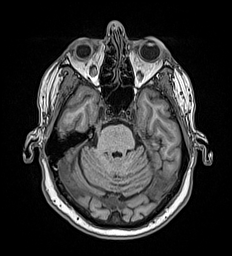
[im 78/176]
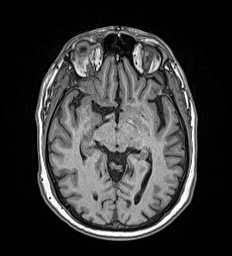
[im 98/176]
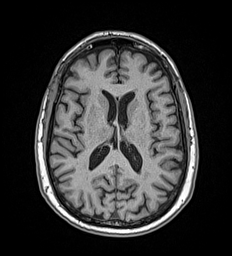
[im 117/176]
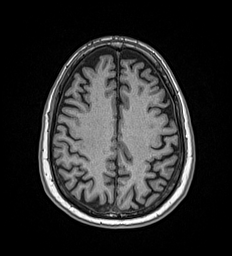
[im 137/176]
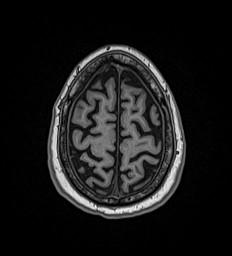
[im 156/176]
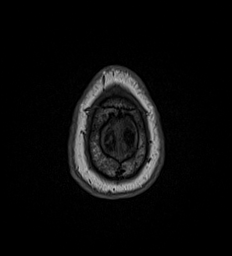
[im 176/176]
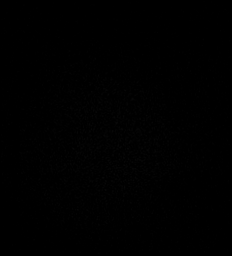

[Series 17: T2 post-contrast · coronal · 5.0mm · 0.57mm/px · 2 of 29 slices shown]
[im 1/29]
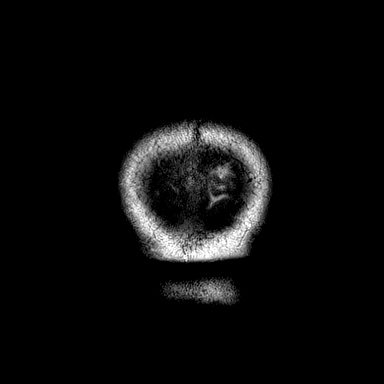
[im 29/29]
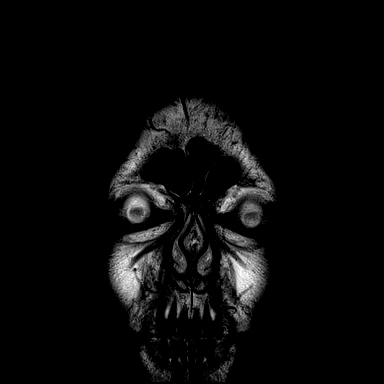

[Series 18: T1 post-contrast · axial · 1.0mm · 0.98mm/px · z∈[-85,+88]mm · 10 of 176 slices shown (1 of 3)]
[im 1/176]
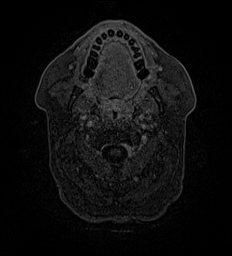
[im 20/176]
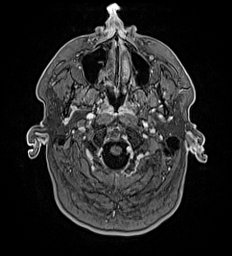
[im 39/176]
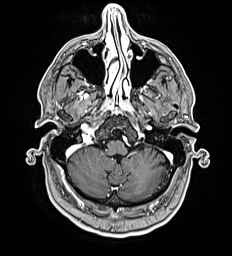
[im 59/176]
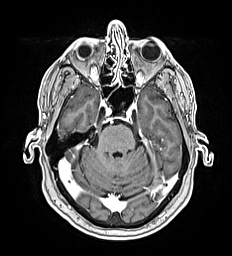
[im 78/176]
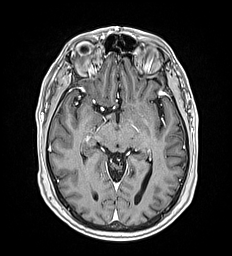
[im 98/176]
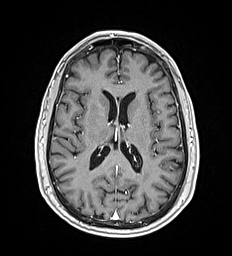
[im 117/176]
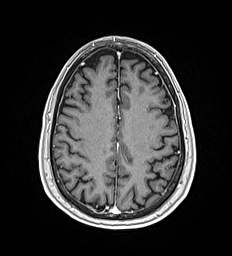
[im 137/176]
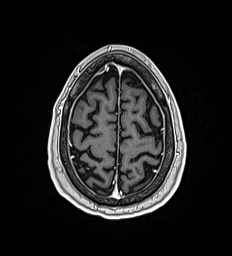
[im 156/176]
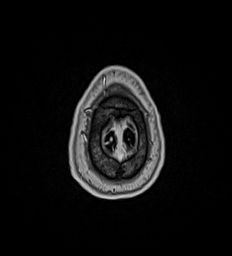
[im 176/176]
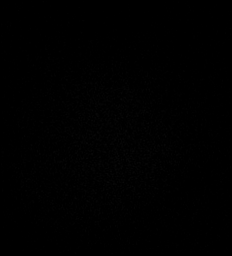

[Series 19: T1 post-contrast · coronal · 5.0mm · 0.57mm/px · 2 of 29 slices shown (2 of 3)]
[im 1/29]
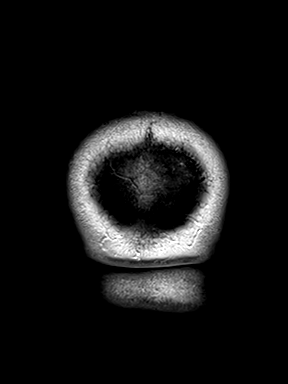
[im 29/29]
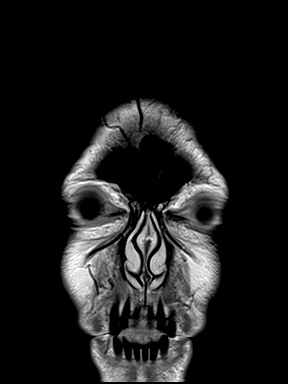

[Series 20: T1 post-contrast · sagittal · 5.0mm · 0.62mm/px · 1 of 25 slices shown (3 of 3)]
[im 1/25]
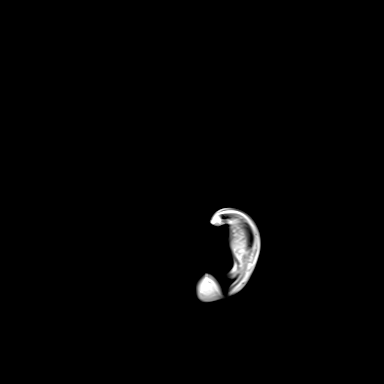

[48 of 48 positions shown; findings below may reference images not displayed]

FINDINGS: Brain: No acute infarction, hemorrhage, hydrocephalus, extra-axial
collection or mass lesion. Mild scattered T2/FLAIR hyperintensities
in the white matter, nonspecific but considered within normal limits
for patient age.

Vascular: Major arterial flow voids are maintained at the skull
base.

Skull and upper cervical spine: Normal marrow signal.

Sinuses/Orbits: Clear sinuses.  No acute orbital findings.

Other: No mastoid effusions.
IMPRESSION: Normal brain MRI.  No evidence of acute abnormality.

## 2021-07-30 MED ORDER — GADOBUTROL 1 MMOL/ML IV SOLN
9.0000 mL | Freq: Once | INTRAVENOUS | Status: AC | PRN
  Administered 2021-07-30: 9 mL via INTRAVENOUS

## 2021-07-31 LAB — ETHANOL: Ethanol: 0.266 %

## 2021-08-03 ENCOUNTER — Ambulatory Visit: Payer: 59 | Admitting: Family

## 2021-08-03 ENCOUNTER — Telehealth: Payer: Self-pay | Admitting: Internal Medicine

## 2021-08-03 NOTE — Telephone Encounter (Signed)
Patient called. MRI results given to him. Patient advised that [facility] sent him a message about MRI results.

## 2021-08-04 ENCOUNTER — Ambulatory Visit: Payer: 59 | Admitting: Internal Medicine

## 2021-08-04 NOTE — Telephone Encounter (Signed)
I called patient to remind him again of his appointment Thursday at 8:30. Pt stated that he would be here. I reminded him he was a NO SHOW for two appointments now.

## 2021-08-06 ENCOUNTER — Ambulatory Visit: Payer: 59 | Admitting: Family

## 2021-08-06 ENCOUNTER — Encounter: Payer: Self-pay | Admitting: Family

## 2021-08-06 VITALS — BP 124/86 | HR 77 | Temp 98.0°F | Ht 70.0 in | Wt 202.0 lb

## 2021-08-06 DIAGNOSIS — R972 Elevated prostate specific antigen [PSA]: Secondary | ICD-10-CM

## 2021-08-06 DIAGNOSIS — F1010 Alcohol abuse, uncomplicated: Secondary | ICD-10-CM | POA: Diagnosis not present

## 2021-08-06 DIAGNOSIS — F10982 Alcohol use, unspecified with alcohol-induced sleep disorder: Secondary | ICD-10-CM

## 2021-08-06 DIAGNOSIS — R42 Dizziness and giddiness: Secondary | ICD-10-CM

## 2021-08-06 DIAGNOSIS — F419 Anxiety disorder, unspecified: Secondary | ICD-10-CM

## 2021-08-06 DIAGNOSIS — F32A Depression, unspecified: Secondary | ICD-10-CM

## 2021-08-06 NOTE — Progress Notes (Signed)
Subjective:    Patient ID: Jonathan Simon, male    DOB: 1960/03/13, 61 y.o.   MRN: [id number]  CC: [name] is a 61 y.o. male who presents today for follow up.   HPI: Accompanied by sister today.   He continues to feel 'fogginess' Symptom is unchanged.  He also feels lightheaded which is unchanged.  No syncopal episode.  Denies chest pain shortness of breath. He ate strawberries, berries today He describes that he has has never eaten regularly. Vomiting from last visit has resolved.   No falls since prior visit.     He had been on antabuse in 2018.  No opioid or prescription drug use  Drinks approx vodka 6oz out of vodka bottle. He doesn't drink every day. Denies eye opener. He will drink when feeling increased anxiety.   HTN-compliant with amlodipine 10 mg, losartan 100 mg.  He is no longer on metoprolol succinate, spironolactone, hydrochlorothiazide  Compliant with zoloft '150mg'$ .  Denies depression. He continues to have trouble staying asleep and will wake up approx 2am most mornings. Compliant with trazodone '50mg'$  however not effective.  No si/hi  Denies urinary frequency, trouble urinating, urinary hesitancy  H/o hepatic steatosis  Previous labs 07/19/21 Ethanol 0.2 TSH 0.26 Elevated iron Increased PSA No blood in urine  HISTORY:  Past Medical History:  Diagnosis Date   Acute maxillary sinusitis    Acute upper respiratory infections of unspecified site    COVID-19 virus infection 10/2019   Edema    Esophageal reflux    Essential hypertension, benign    External hemorrhoids without mention of complication    HLD (hyperlipidemia)    Other and unspecified hyperlipidemia    Other malaise and fatigue    Sinusitis, acute, maxillary    Spontaneous pneumothorax    bilateral s/p surgery/pleurodesis [facility]    Treadmill stress test negative for angina pectoris 2006   [facility]   Past Surgical History:  Procedure Laterality Date   COLONOSCOPY WITH PROPOFOL N/A  06/25/2016   Procedure: COLONOSCOPY WITH PROPOFOL;  Surgeon: Robert Bellow, MD;  Location: [facility];  Service: Endoscopy;  Laterality: N/A;   COLONOSCOPY WITH PROPOFOL N/A 06/24/2021   Procedure: COLONOSCOPY WITH PROPOFOL;  Surgeon: Robert Bellow, MD;  Location: [facility];  Service: Endoscopy;  Laterality: N/A;   [place]   with loop colostomy secondary to lightning rod impalement at [place] N/A 10/02/2019   Procedure: [place], umbilical;  Surgeon: Olean Ree, MD;  Location: [facility] ORS;  Service: General;  Laterality: N/A;   INGUINAL HERNIA REPAIR Right 2012   Open, with mesh, Dr. Leanora Cover   LAPAROSCOPIC INGUINAL HERNIA REPAIR Left    before 2012   THORACOTOMY Right 1987   with blebectomy and pleurodesis   THORACOTOMY Left 1997   with blebectomy and pleurodesis   XI ROBOTIC ASSISTED INGUINAL HERNIA REPAIR WITH MESH Right 10/02/2019   Procedure: XI ROBOTIC [place];  Surgeon: Olean Ree, MD;  Location: [facility] ORS;  Service: General;  Laterality: Right;   Family History  Problem Relation Age of Onset   COPD Mother 56   Dementia Mother    Heart disease Father 32       Massive MI    Heart disease Paternal Grandfather    Colon cancer Neg Hx     Allergies: Latex Current Outpatient Medications  on File Prior to Visit  Medication Sig Dispense Refill   albuterol (PROVENTIL HFA;VENTOLIN HFA) 108 (90 BASE) MCG/ACT inhaler Inhale 2 puffs into the lungs every 6 (six) hours as needed for wheezing. 1 Inhaler 0   amLODipine (NORVASC) 10 MG tablet TAKE 1 TABLET BY MOUTH DAILY (Patient taking differently: Take 10 mg by mouth daily.) 90 tablet 3   losartan (COZAAR) 100 MG tablet TAKE 1 TABLET BY MOUTH DAILY (Patient taking differently: Take 100 mg by mouth daily.) 90 tablet 3   Magnesium Oxide 200 MG TABS Take 1 tablet (200 mg total)  by mouth in the morning and at bedtime. 60 tablet 0   omeprazole (PRILOSEC) 20 MG capsule Take 1 capsule (20 mg total) by mouth daily as needed.     rosuvastatin (CRESTOR) 10 MG tablet Take 1 tablet (10 mg total) by mouth daily. 90 tablet 1   sertraline (ZOLOFT) 100 MG tablet Take 1 tablet (100 mg total) by mouth daily. 90 tablet 1   sertraline (ZOLOFT) 50 MG tablet Take 1 tablet (50 mg total) by mouth at bedtime. Take with '100mg'$  tablet at bedtime for total of '150mg'$ . 90 tablet 3   simvastatin (ZOCOR) 20 MG tablet Take 20 mg by mouth daily.     promethazine (PHENERGAN) 12.5 MG tablet Take 12.5 mg by mouth every 8 (eight) hours as needed for nausea or vomiting. (Patient not taking: Reported on 07/29/2021)     tadalafil (CIALIS) 5 MG tablet Take 2.5 mg by mouth daily as needed for erectile dysfunction. (Patient not taking: Reported on 07/29/2021)     No current facility-administered medications on file prior to visit.    Social History   Tobacco Use   Smoking status: Never   Smokeless tobacco: Never  Vaping Use   Vaping Use: Never used  Substance Use Topics   Alcohol use: Not Currently    Alcohol/week: 42.0 standard drinks of alcohol    Types: 42 Cans of beer per week    Comment: beer on weekends    Drug use: Never    Review of Systems  Constitutional:  Negative for chills and fever.  Respiratory:  Negative for cough.   Cardiovascular:  Negative for chest pain and palpitations.  Gastrointestinal:  Negative for nausea and vomiting.  Neurological:  Positive for dizziness. Negative for syncope and weakness.  Psychiatric/Behavioral:  Positive for sleep disturbance. Negative for suicidal ideas. The patient is nervous/anxious.       Objective:    BP 124/86 (BP Location: Left Arm, Patient Position: Sitting, Cuff Size: Normal)   Pulse 77   Temp 98 F (36.7 C) (Oral)   Ht '5\' 10"'$  (1.778 m)   Wt 202 lb (91.6 kg)   SpO2 95%   BMI 28.98 kg/m  BP Readings from Last 3 Encounters:   08/06/21 124/86  07/29/21 128/82  07/13/21 (!) 82/48   Wt Readings from Last 3 Encounters:  08/06/21 202 lb (91.6 kg)  07/29/21 202 lb 9.6 oz (91.9 kg)  07/13/21 199 lb 6.4 oz (90.4 kg)    Physical Exam Vitals reviewed.  Constitutional:      Appearance: He is well-developed.  Cardiovascular:     Rate and Rhythm: Regular rhythm.     Heart sounds: Normal heart sounds.  Pulmonary:     Effort: Pulmonary effort is normal. No respiratory distress.     Breath sounds: Normal breath sounds. No wheezing, rhonchi or rales.  Skin:    General: Skin is warm and dry.  Neurological:     Mental Status: He is alert.  Psychiatric:        Speech: Speech normal.        Behavior: Behavior normal.        Assessment & Plan:   Problem List Items Addressed This Visit       Other   Alcohol abuse - Primary    Long discussion in regard to alcohol abuse as we reviewed labs today . Advised him call  [facility] in [place] ( information provided on AVS)  to discuss outpatient treatment program and support. He will continue to follow with AA sponsor. Counseled him on not driving when drinking alcohol.  Concerned for hemachromatosis likely from underlying alcoholic liver disease. He is not taking iron.  He has elevated transferrin saturation of 45% and/or elevated serum ferritin concentration > 1500. Pending [place] RUQ and repeat ferritin studies. He has previously been on antabuse in 2018. Plan to refer to GI and hematology once RUQ as returned.       Relevant Orders   IBC + Ferritin   T3, free   T4, free   TSH   US ABDOMEN LIMITED RUQ (LIVER/GB)   Anxiety and depression    Suboptimal control.  He is compliant with Zoloft 150 mg.  We agreed counseling would be very appropriate to help him with anxiety related insomnia, alcohol abuse.  Trazodone unfortunately has not been helpful for her in him and he politely declines increasing trazodone 50 to 100 mg although that could be something to  consider in the future.  In the meantime we opted to discontinue trazodone altogether since medication not particularly  effective.    Discussed how alcohol effects quality of sleep.  Reiterated the importance of exercise and advised him to trial melatonin otc.  Close follow-up      Dizziness    Patient well-appearing today.  No falls or syncopal episodes.  He continues to describe a "fogginess".  Reassuring MRI of the brain 07/30/21.  Etiology for symptom nonspecific at this time however we discussed alcohol use, insomnia, poor nutrition as likely contributory. His blood pressure has increased appropriately since we have discontinued spironolactone,hctz due to orthostasis  And AKI on 07/13/21. Currently he is on amlodipine 10 mg, losartan 100 mg.  Opted to continue these medications at this time while we await results of ultrasound carotid, echocardiogram. Close follow up.       Increased prostate specific antigen (PSA) velocity    He is asymptomatic at this time.  Pending referral to urology for increased PSA velocity,psa 0.79-> 1.15.      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 Mitcheltree maintain his albuterol, amLODipine, losartan, omeprazole, rosuvastatin, Magnesium Oxide, promethazine, simvastatin, tadalafil, sertraline, and sertraline.   No orders of the defined types were placed in this encounter.   Return precautions given.   Risks, benefits, and alternatives of the medications and treatment plan prescribed today were discussed, and patient expressed understanding.   Education regarding symptom management and diagnosis given to patient on AVS.  Continue to follow with Crecencio Mc, MD for routine health maintenance.   Dereke Soyla Murphy and I agreed with plan.   Mable Paris, FNP

## 2021-08-06 NOTE — Patient Instructions (Addendum)
Stop trazodone if not effect  Referral in for counseling Let us know if you dont hear back within a week in regards to an appointment being scheduled.   Essentials for good sleep:   #1 Exercise #2 Limit Caffeine ( no caffeine after lunch) #3 No smart phones, TV prior to bed -- BLUE light is VERY activating and send the brain an 'awake message.'  #4 Go to bed at same time of night each night and get up at same time of day.  #5 Take 0.5 to '5mg'$  melatonin at 7pm with dinner -this is when natural melatonin will start to increase  Please call [facility] : [phone] for outpatient recovery program , support and information.   [facility] [place], [place], [place], [place]    Alcohol Abuse and Nutrition Alcohol abuse is any pattern of alcohol consumption that harms your health, relationships, or work. Alcohol abuse can affect how your body breaks down and absorbs nutrients from food by causing your liver to work abnormally. Additionally, many people who abuse alcohol do not eat enough carbohydrates, protein, fat, vitamins, and minerals. This can cause poor nutrition (malnutrition) and a lack of nutrients (nutrient deficiencies), which can lead to further complications. Nutrients that are commonly lacking (deficient) among people who abuse alcohol include: Vitamins. Vitamin A. This is stored in your liver. It is important for your vision, metabolism, and ability to fight off infections (immunity). B vitamins. These include vitamins such as folate, thiamin, and niacin. These are important in new cell growth and maintenance. Vitamin C. This plays an important role in iron absorption, wound healing, and immunity. Vitamin D. This is produced by your liver, but you can also get vitamin D from food. Vitamin D is necessary for your body to absorb and use calcium. Minerals. Calcium. This is important for your bones and your heart and blood vessel (cardiovascular)  function. Iron. This is important for blood, muscle, and nervous system functioning. Magnesium. This plays an important role in muscle and nerve function, and it helps to control blood sugar and blood pressure. Zinc. This is important for the normal function of your nervous system and digestive system (gastrointestinal tract). Nutrition is an essential component of therapy for alcohol abuse. Your health care provider or dietitian will work with you to design a plan that can help restore nutrients to your body and prevent potential complications. WHAT IS MY PLAN? Your dietitian may develop a specific diet plan that is based on your condition and any other complications you may have. A diet plan will commonly include: A balanced diet. Grains: 6-8 oz per day. Vegetables: 2-3 cups per day. Fruits: 1-2 cups per day. Meat and other protein: 5-6 oz per day. Dairy: 2-3 cups per day. Vitamin and mineral supplements. WHAT DO I NEED TO KNOW ABOUT ALCOHOL AND NUTRITION? Consume foods that are high in antioxidants, such as grapes, berries, nuts, green tea, and dark green and orange vegetables. This can help to counteract some of the stress that is placed on your liver by consuming alcohol. Avoid food and drinks that are high in fat and sugar. Foods such as sugared soft drinks, salty snack foods, and candy contain empty calories. This means that they lack important nutrients such as protein, fiber, and vitamins. Eat frequent meals and snacks. Try to eat 5-6 small meals each day. Eat a variety of fresh fruits and vegetables each day. This will help you get plenty of water, fiber, and vitamins  in your diet. Drink plenty of water and other clear fluids. Try to drink at least 48-64 oz (1.5-2 L) of water per day. If you are a vegetarian, eat a variety of protein-rich foods. Pair whole grains with plant-based proteins at meals and snacks to obtain the greatest nutrient benefit from your food. For example, eat rice  with beans, put peanut butter on whole-grain toast, or eat oatmeal with sunflower seeds. Soak beans and whole grains overnight before cooking. This can help your body to absorb the nutrients more easily. Include foods fortified with vitamins and minerals in your diet. Commonly fortified foods include milk, orange juice, cereal, and bread. If you are malnourished, your dietitian may recommend a high-protein, high-calorie diet. This may include: 2,000-3,000 calories (kilocalories) per day. 70-100 grams of protein per day. Your health care provider may recommend a complete nutritional supplement beverage. This can help to restore calories, protein, and vitamins to your body. Depending on your condition, you may be advised to consume this instead of or in addition to meals. Limit your intake of caffeine. Replace drinks like coffee and black tea with decaffeinated coffee and herbal tea. Eat a variety of foods that are high in omega fatty acids. These include fish, nuts and seeds, and soybeans. These foods may help your liver to recover and may also stabilize your mood. Certain medicines may cause changes in your appetite, taste, and weight. Work with your health care provider and dietitian to make any adjustments to your medicines and diet plan. Include other healthy lifestyle choices in your daily routine. Be physically active. Get enough sleep. Spend time doing activities that you enjoy. If you are unable to take in enough food and calories by mouth, your health care provider may recommend a feeding tube. This is a tube that passes through your nose and throat, directly into your stomach. Nutritional supplement beverages can be given to you through the feeding tube to help you get the nutrients you need. Take vitamin or mineral supplements as recommended by your health care provider. WHAT FOODS CAN I EAT? Grains Enriched pasta. Enriched rice. Fortified whole-grain bread. Fortified whole-grain cereal.  Barley. Brown rice. Quinoa. Morton. Vegetables All fresh, frozen, and canned vegetables. Spinach. Kale. Artichoke. Carrots. Winter squash and pumpkin. Sweet potatoes. Broccoli. Cabbage. Cucumbers. Tomatoes. Sweet peppers. Green beans. Peas. Corn. Fruits All fresh and frozen fruits. Berries. Grapes. Mango. Papaya. Guava. Cherries. Apples. Bananas. Peaches. Plums. Pineapple. Watermelon. Cantaloupe. Oranges. Avocado. Meats and Other Protein Sources Beef liver. Lean beef. Pork. Fresh and canned chicken. Fresh fish. Oysters. Sardines. Canned tuna. Shrimp. Eggs with yolks. Nuts and seeds. Peanut butter. Beans and lentils. Soybeans. Tofu. Dairy Whole, low-fat, and nonfat milk. Whole, low-fat, and nonfat yogurt. Cottage cheese. Sour cream. Hard and soft cheeses. Beverages Water. Herbal tea. Decaffeinated coffee. Decaffeinated green tea. 100% fruit juice. 100% vegetable juice. Instant breakfast shakes. Condiments Ketchup. Mayonnaise. Mustard. Salad dressing. Barbecue sauce. Sweets and Desserts Sugar-free ice cream. Sugar-free pudding. Sugar-free gelatin. Fats and Oils Butter. Vegetable oil, flaxseed oil, olive oil, and walnut oil. Other Complete nutrition shakes. Protein bars. Sugar-free gum. The items listed above may not be a complete list of recommended foods or beverages. Contact your dietitian for more options. WHAT FOODS ARE NOT RECOMMENDED? Grains Sugar-sweetened breakfast cereals. Flavored instant oatmeal. Fried breads. Vegetables Breaded or deep-fried vegetables. Fruits Dried fruit with added sugar. Candied fruit. Canned fruit in syrup. Meats and Other Protein Sources Breaded or deep-fried meats. Dairy Flavored milks. Fried cheese curds or fried  cheese sticks. Beverages Alcohol. Sugar-sweetened soft drinks. Sugar-sweetened tea. Caffeinated coffee and tea. Condiments Sugar. Honey. Agave nectar. Molasses. Sweets and Desserts Chocolate. Cake. Cookies. Candy. Other Potato chips.  Pretzels. Salted nuts. Candied nuts. The items listed above may not be a complete list of foods and beverages to avoid. Contact your dietitian for more information.   This information is not intended to replace advice given to you by your health care provider. Make sure you discuss any questions you have with your health care provider.   Document Released: 12/10/2004 Document Revised: 03/08/2014 Document Reviewed: 09/18/2013 Elsevier Interactive Patient Education [employer].

## 2021-08-06 NOTE — Assessment & Plan Note (Addendum)
Long discussion in regard to alcohol abuse as we reviewed labs today . Advised him call  [facility] in [place] ( information provided on AVS)  to discuss outpatient treatment program and support. He will continue to follow with AA sponsor. Counseled him on not driving when drinking alcohol.  Concerned for hemachromatosis likely from underlying alcoholic liver disease. He is not taking iron.  He has elevated transferrin saturation of 45% and/or elevated serum ferritin concentration > 1500. Pending [place] RUQ and repeat ferritin studies. He has previously been on antabuse in 2018. Plan to refer to GI and hematology once RUQ as returned.

## 2021-08-06 NOTE — Assessment & Plan Note (Signed)
He is asymptomatic at this time.  Pending referral to urology for increased PSA velocity,psa 0.79-> 1.15.

## 2021-08-06 NOTE — Assessment & Plan Note (Signed)
Suboptimal control.  He is compliant with Zoloft 150 mg.  We agreed counseling would be very appropriate to help him with anxiety related insomnia, alcohol abuse.  Trazodone unfortunately has not been helpful for her in him and he politely declines increasing trazodone 50 to 100 mg although that could be something to consider in the future.  In the meantime we opted to discontinue trazodone altogether since medication not particularly  effective.    Discussed how alcohol effects quality of sleep.  Reiterated the importance of exercise and advised him to trial melatonin otc.  Close follow-up

## 2021-08-06 NOTE — Assessment & Plan Note (Addendum)
Patient well-appearing today.  No falls or syncopal episodes.  He continues to describe a "fogginess".  Reassuring MRI of the brain 07/30/21.  Etiology for symptom nonspecific at this time however we discussed alcohol use, insomnia, poor nutrition as likely contributory. His blood pressure has increased appropriately since we have discontinued spironolactone,hctz due to orthostasis  And AKI on 07/13/21. Currently he is on amlodipine 10 mg, losartan 100 mg.  Opted to continue these medications at this time while we await results of ultrasound carotid, echocardiogram. Close follow up.

## 2021-08-10 ENCOUNTER — Encounter: Payer: Self-pay | Admitting: Family

## 2021-08-11 ENCOUNTER — Ambulatory Visit
Admission: RE | Admit: 2021-08-11 | Discharge: 2021-08-11 | Disposition: A | Discharge status: Home / Self Care | Payer: 59 | Source: Ambulatory Visit | Attending: Family | Admitting: Family

## 2021-08-11 ENCOUNTER — Telehealth: Payer: Self-pay | Admitting: Internal Medicine

## 2021-08-11 ENCOUNTER — Other Ambulatory Visit: Payer: Self-pay | Admitting: Family

## 2021-08-11 DIAGNOSIS — F1010 Alcohol abuse, uncomplicated: Secondary | ICD-10-CM | POA: Diagnosis present

## 2021-08-11 DIAGNOSIS — R7989 Other specified abnormal findings of blood chemistry: Secondary | ICD-10-CM

## 2021-08-11 DIAGNOSIS — K760 Fatty (change of) liver, not elsewhere classified: Secondary | ICD-10-CM

## 2021-08-11 IMAGING — US US ABDOMEN LIMITED
2 series · 14 of 25 positions shown · non-contrast
Comparison: Abdominal ultrasound [DATE]

CLINICAL DATA: Alcohol abuse

EXAM:
ULTRASOUND ABDOMEN LIMITED RIGHT UPPER QUADRANT

[Series 1: us abdomen limited ruq (liver/gb) · 8 of 40 slices shown (1 of 2)]
[im 1/40]
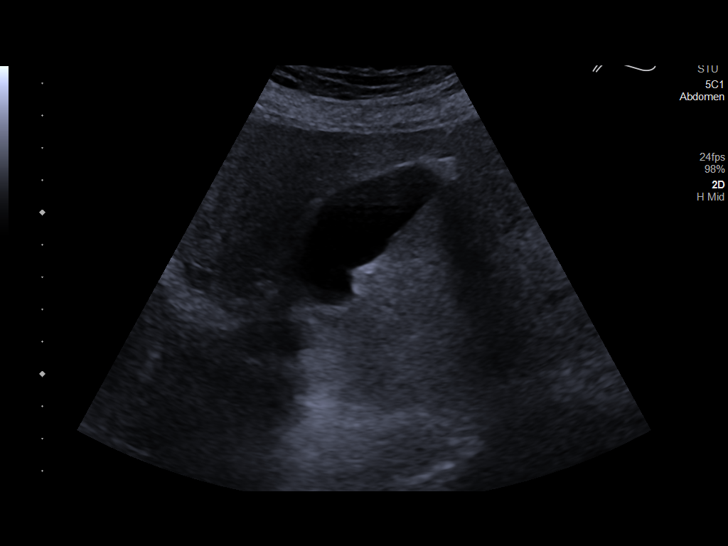
[im 7/40]
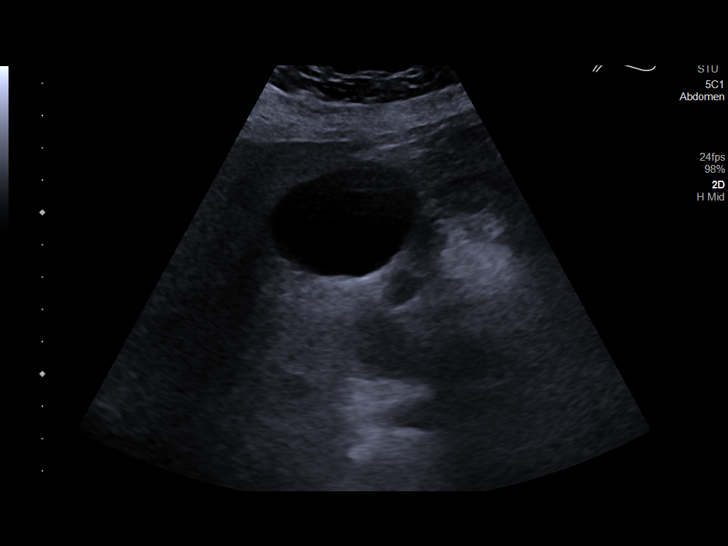
[im 13/40]
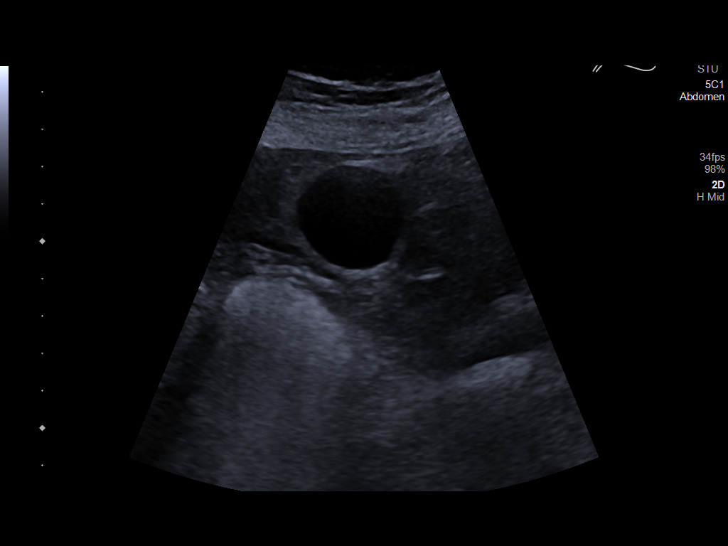
[im 19/40]
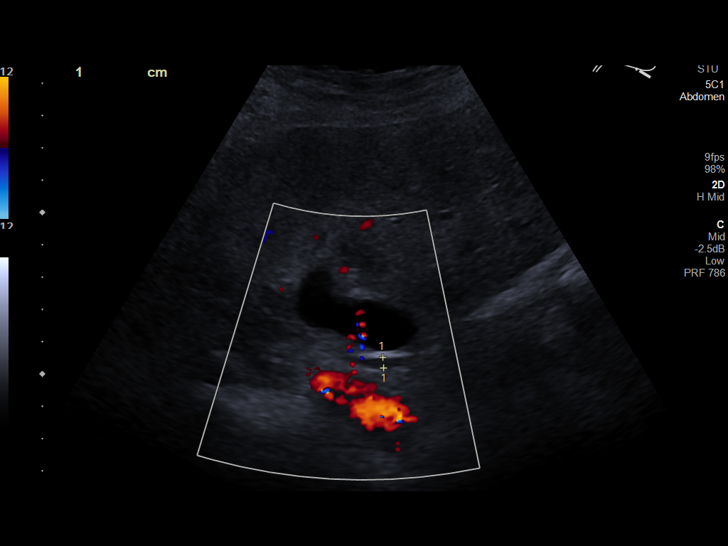
[im 25/40]
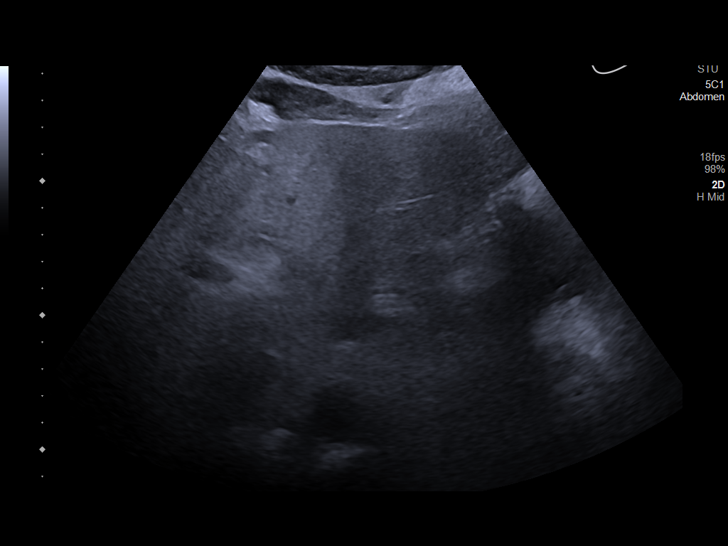
[im 28/40]
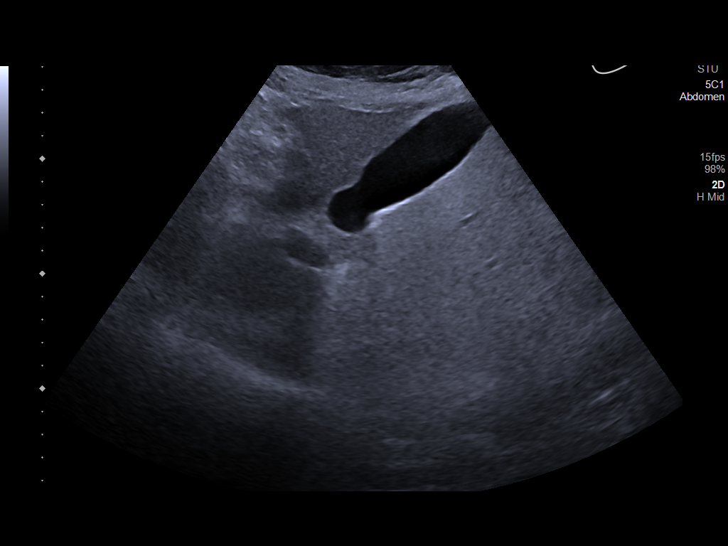
[im 34/40]
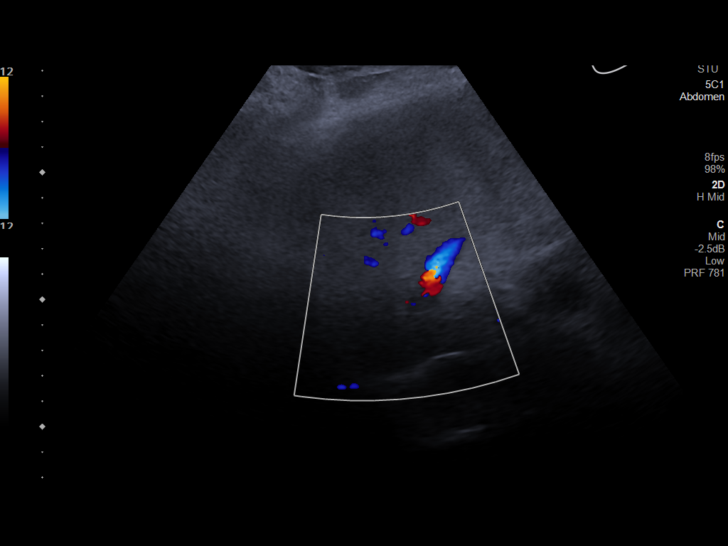
[im 40/40]
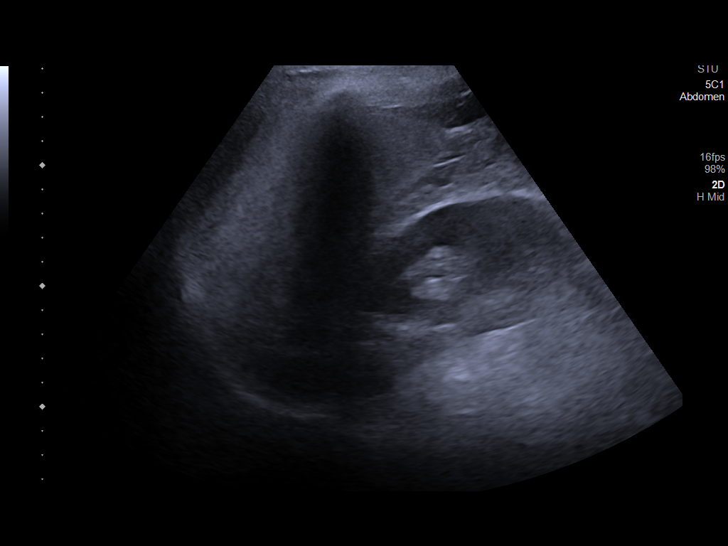

[Series 1001: us abdomen limited ruq (liver/gb) · 6 of 30 slices shown (2 of 2)]
[im 3/30]
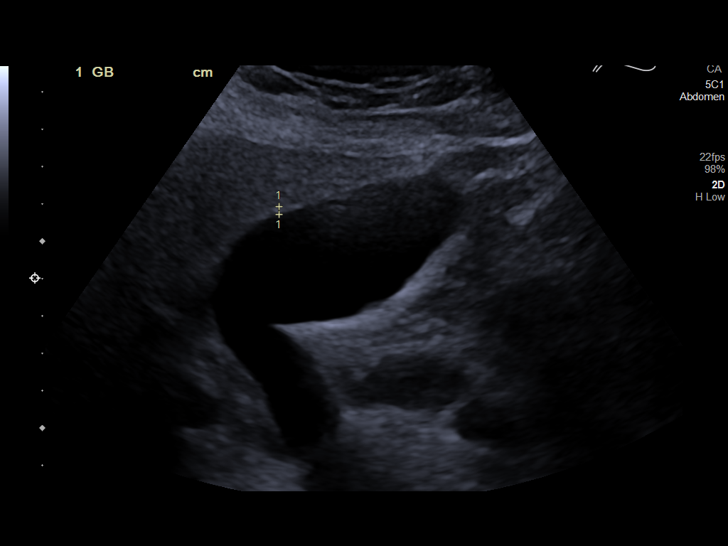
[im 6/30]
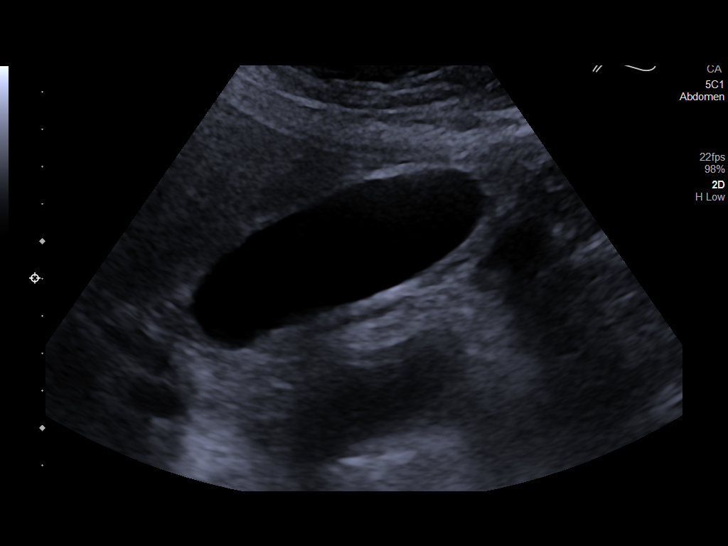
[im 12/30]
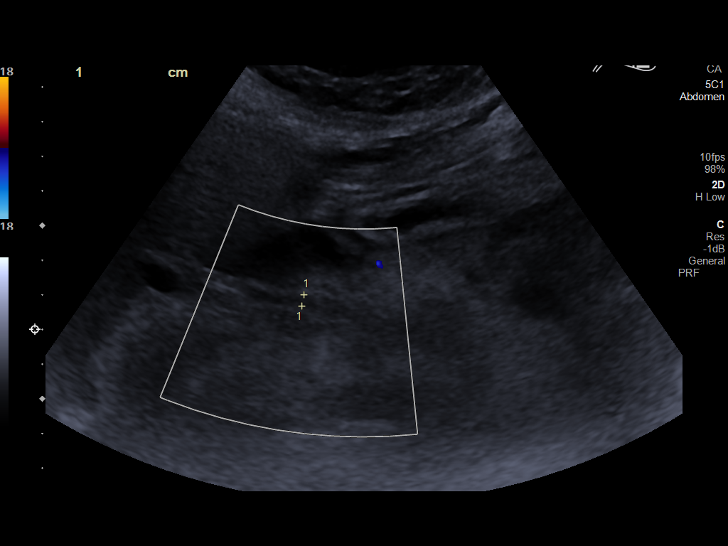
[im 18/30]
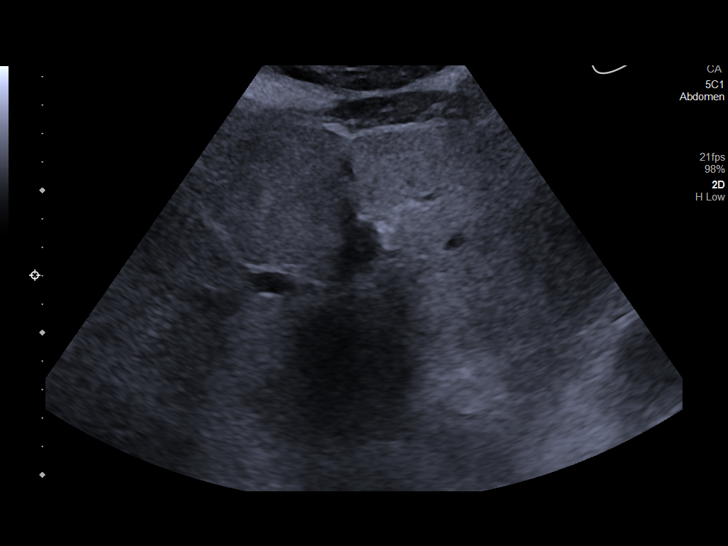
[im 24/30]
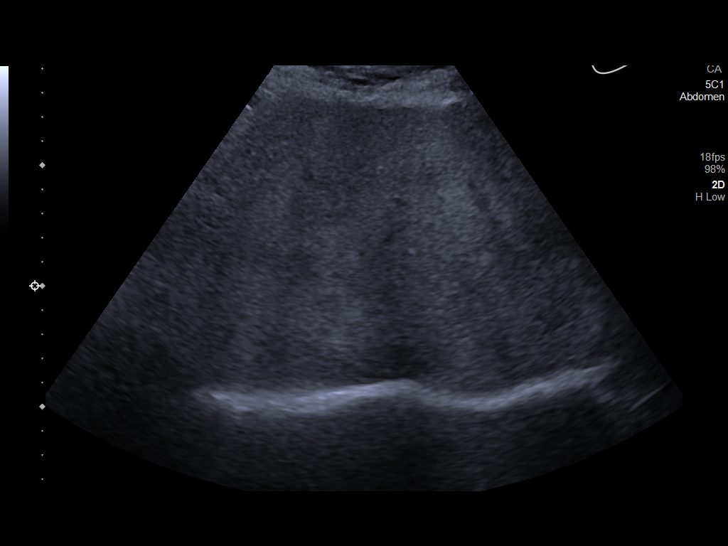
[im 30/30]
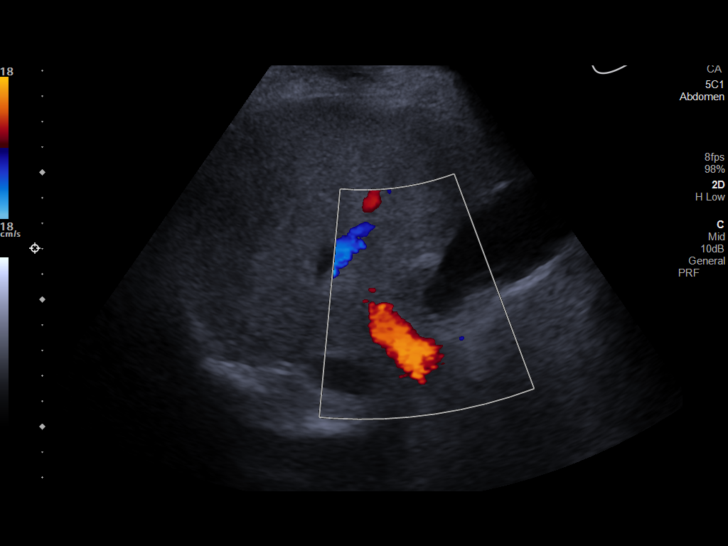

[14 of 25 positions shown; findings below may reference images not displayed]

FINDINGS: Gallbladder:

No gallstones or wall thickening visualized. No sonographic Murphy
sign noted by sonographer.

Common bile duct:

Diameter: 3 mm

Liver:

Diffuse increased echogenicity of the parenchyma with no focal mass
identified. Portal vein is patent on color Doppler imaging with
normal direction of blood flow towards the liver.

Other: None.
IMPRESSION: Diffuse increased echogenicity of the liver parenchyma suggesting
hepatic steatosis and/or other hepatocellular disease.

## 2021-08-11 NOTE — Telephone Encounter (Signed)
Patient called office back and stated that triage nurse told him to be seen in the next 24 hours. Dr Derrel Nip had no appointments, patient was schedule with another provider.

## 2021-08-11 NOTE — Telephone Encounter (Signed)
Pt called stating he is dizzy and lightheaded for a week sent to access nurse

## 2021-08-11 NOTE — Telephone Encounter (Signed)
Scheduled with Dr. Olivia Mackie for tomorrow.

## 2021-08-12 ENCOUNTER — Ambulatory Visit: Payer: 59 | Admitting: Internal Medicine

## 2021-08-12 ENCOUNTER — Encounter: Payer: Self-pay | Admitting: Internal Medicine

## 2021-08-12 ENCOUNTER — Ambulatory Visit
Admission: RE | Admit: 2021-08-12 | Discharge: 2021-08-12 | Disposition: A | Discharge status: Home / Self Care | Payer: 59 | Attending: Radiology | Admitting: Radiology

## 2021-08-12 ENCOUNTER — Ambulatory Visit: Payer: Self-pay | Admitting: Urology

## 2021-08-12 ENCOUNTER — Ambulatory Visit
Admission: RE | Admit: 2021-08-12 | Discharge: 2021-08-12 | Disposition: A | Discharge status: Home / Self Care | Payer: 59 | Source: Ambulatory Visit | Attending: Internal Medicine | Admitting: Internal Medicine

## 2021-08-12 VITALS — Temp 99.4°F | Resp 14 | Ht 70.0 in | Wt 198.2 lb

## 2021-08-12 DIAGNOSIS — E785 Hyperlipidemia, unspecified: Secondary | ICD-10-CM | POA: Diagnosis not present

## 2021-08-12 DIAGNOSIS — R42 Dizziness and giddiness: Secondary | ICD-10-CM | POA: Diagnosis not present

## 2021-08-12 DIAGNOSIS — R0602 Shortness of breath: Secondary | ICD-10-CM

## 2021-08-12 DIAGNOSIS — I10 Essential (primary) hypertension: Secondary | ICD-10-CM | POA: Diagnosis not present

## 2021-08-12 IMAGING — CR DG CHEST 2V
1 series · 2 of 2 positions shown · non-contrast
Comparison: [DATE]

CLINICAL DATA: Shortness of breath with exertion.

EXAM:
CHEST - 2 VIEW

[Series 1: dg chest 2 view · 0.14mm/px · 2 of 2 slices shown]
[im 1/2]
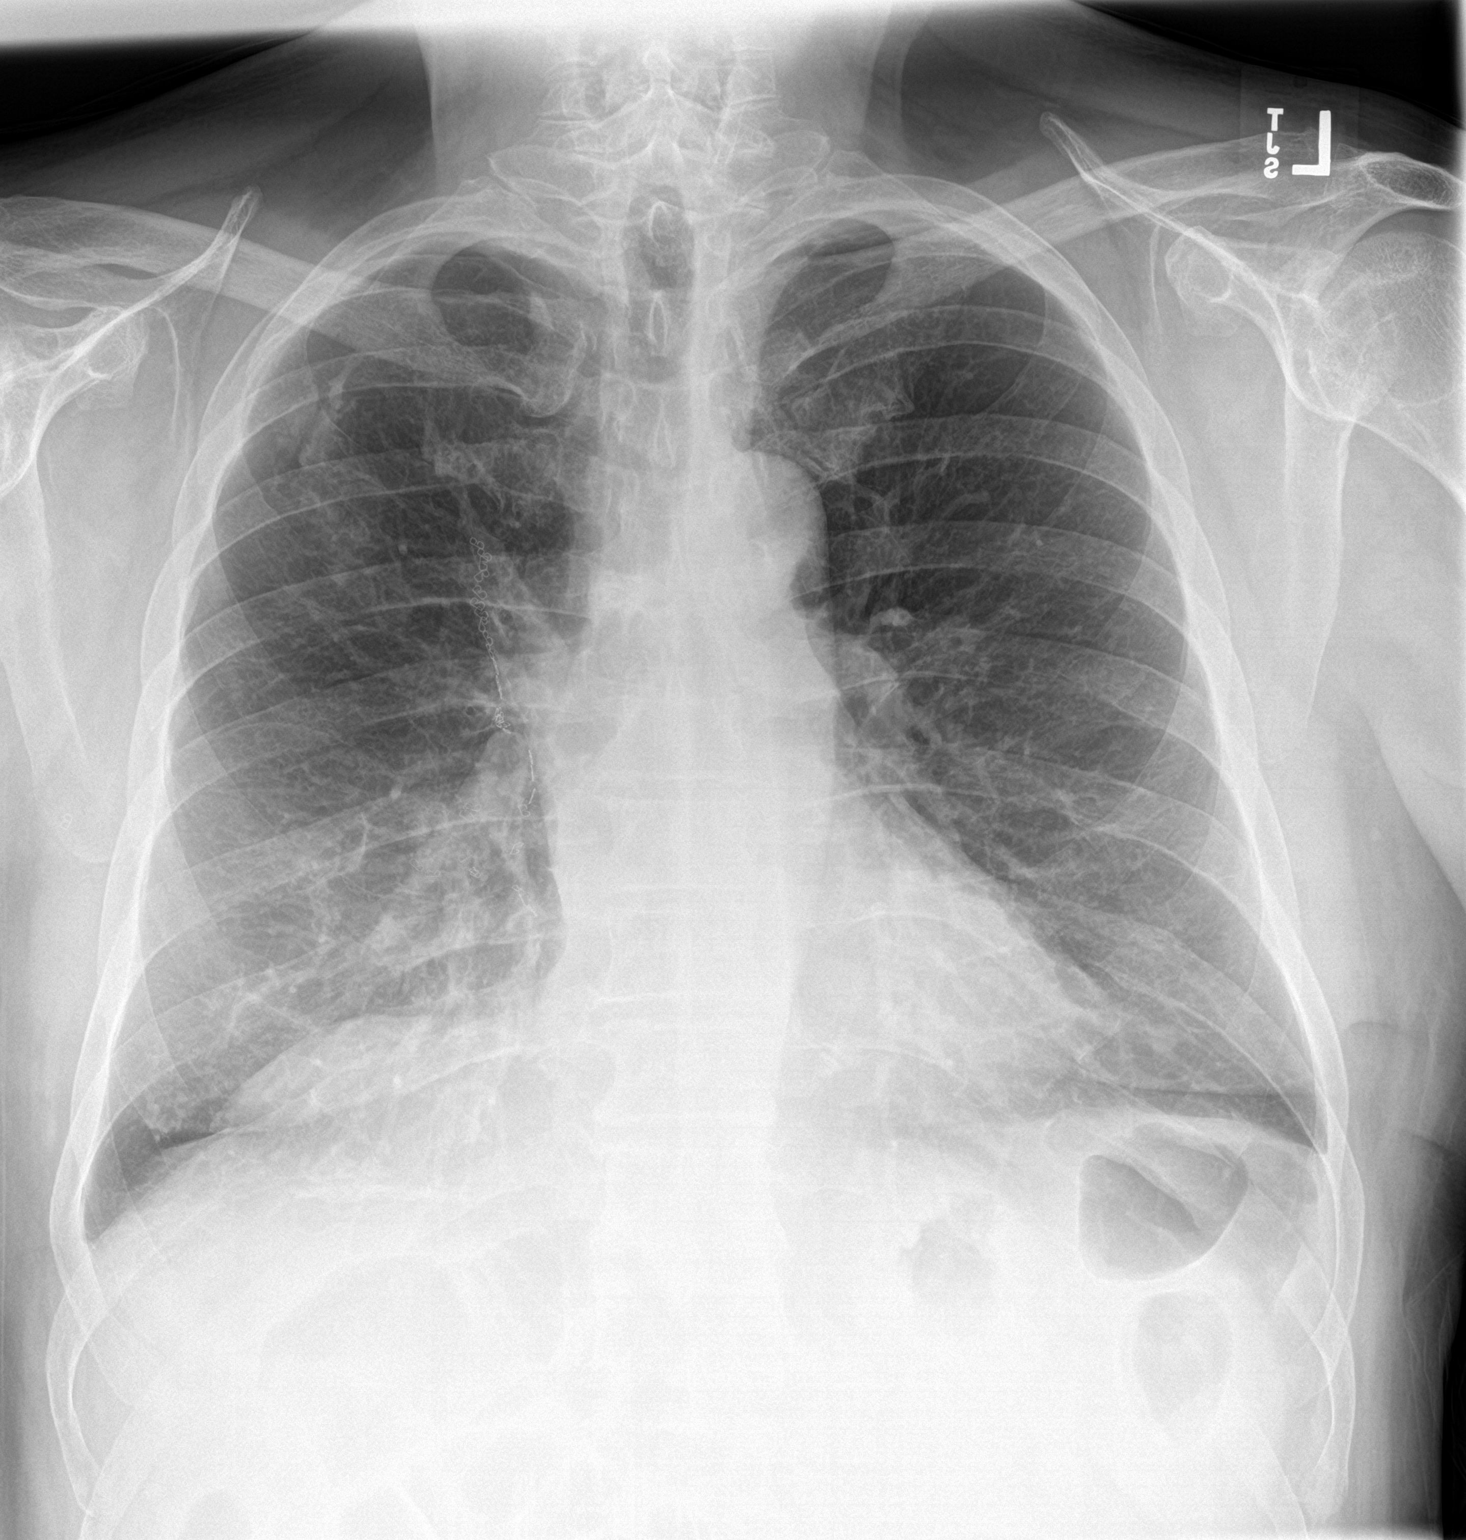
[im 2/2]
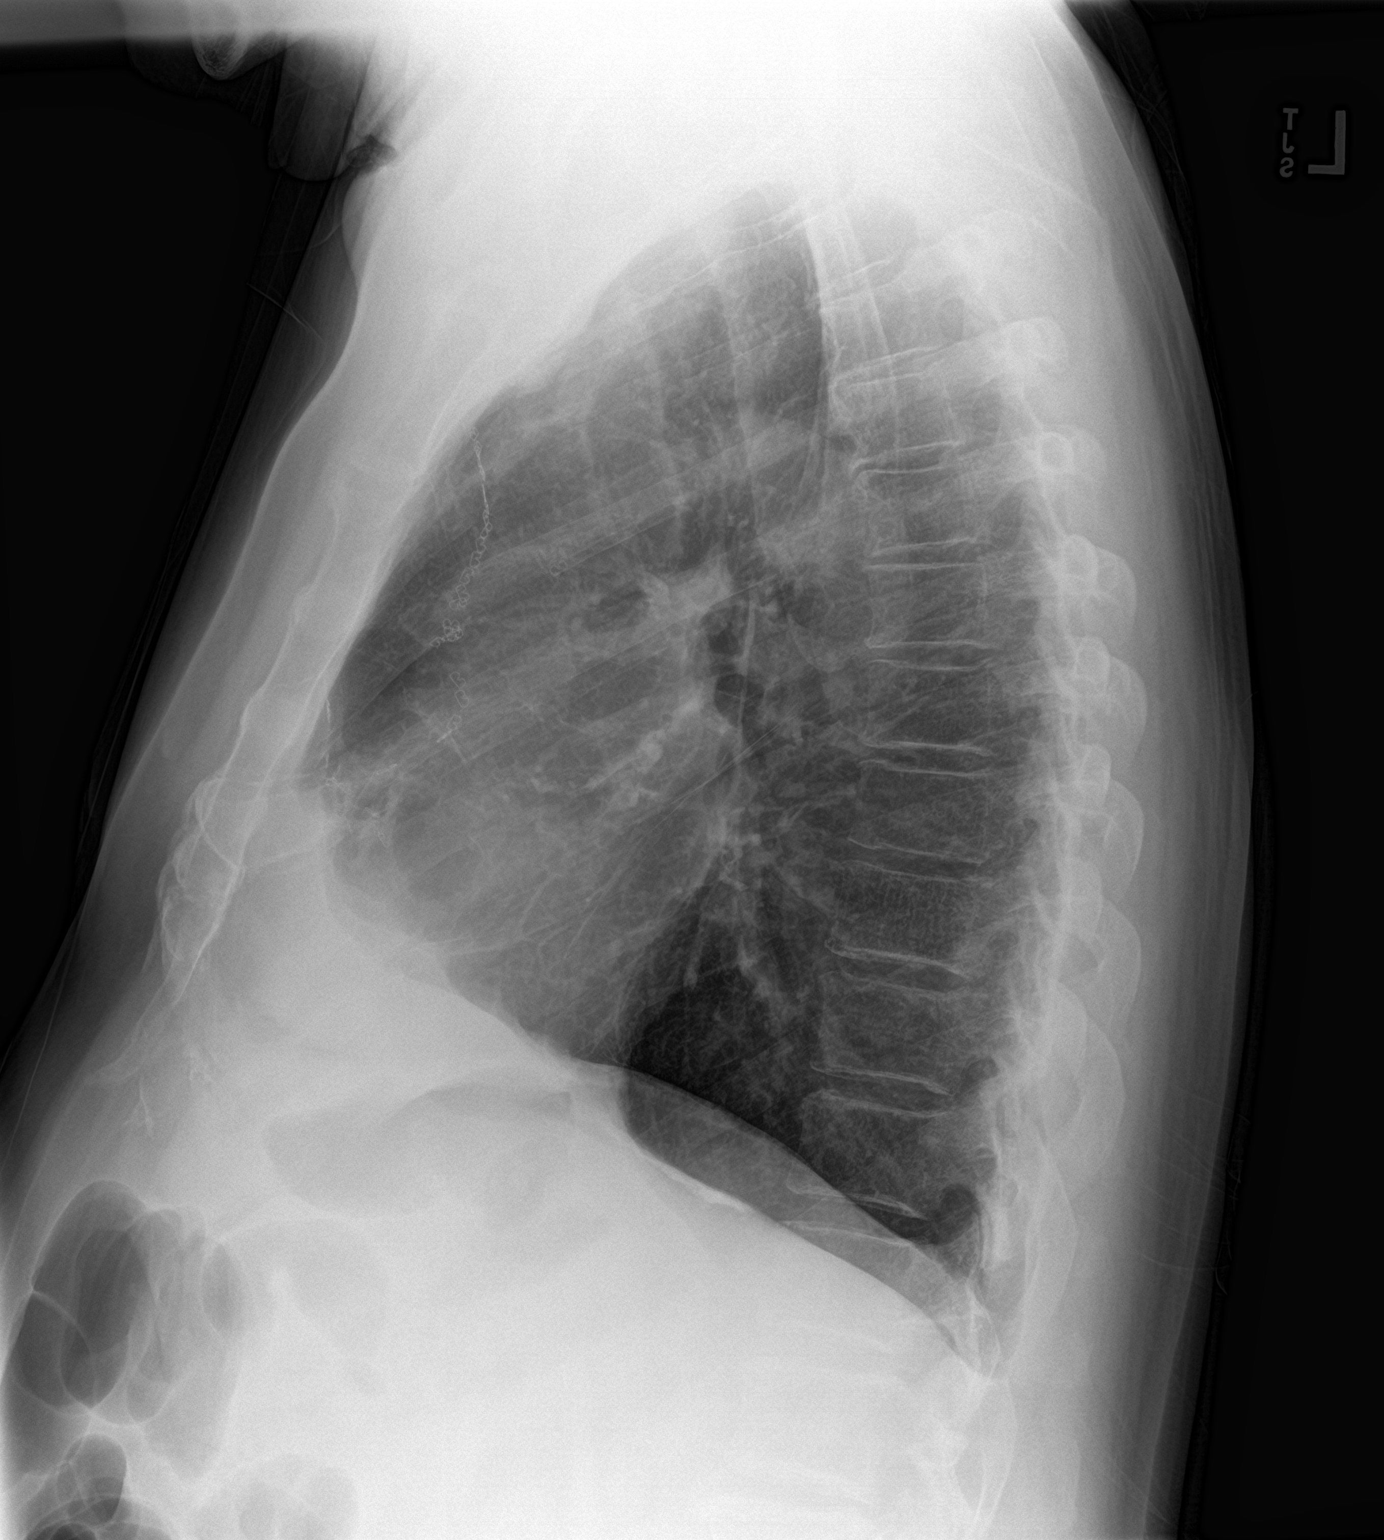

[2 of 2 positions shown; findings below may reference images not displayed]

FINDINGS: Stable surgical staples in the right hilar region. The heart, hila,
and mediastinum are stable. Vertically oriented linear nodular
scarring or calcified pleural plaque in the upper outer right lung
is stable. Probable nipple shadow projected over the lateral left
base, not seen previously. No other interval changes or acute
abnormalities.
IMPRESSION: 1. A nodular density projected over the lateral left lung base is
probably a nipple shadow. Recommend repeat imaging with nipple
markers for confirmation.
2. No other significant or interval changes.

## 2021-08-12 MED ORDER — METOPROLOL SUCCINATE ER 25 MG PO TB24: 12.5000 mg | ORAL_TABLET | Freq: Every day | ORAL | 3 refills | Status: DC

## 2021-08-12 MED ORDER — SPIRONOLACTONE 50 MG PO TABS: 25.0000 mg | ORAL_TABLET | Freq: Every day | ORAL | 3 refills | Status: DC

## 2021-08-12 NOTE — Patient Instructions (Addendum)
[facility]  [address] [place], [place] [address] Phone: [phone]  Premier protein shake   We could consider bystolic in the future if needed if we need to stop fluid pill and change toprol to bystolic     DASH Eating Plan DASH stands for Dietary Approaches to Stop Hypertension. The DASH eating plan is a healthy eating plan that has been shown to: Reduce high blood pressure (hypertension). Reduce your risk for type 2 diabetes, heart disease, and stroke. Help with weight loss. What are tips for following this plan? Reading food labels Check food labels for the amount of salt (sodium) per serving. Choose foods with less than 5 percent of the Daily Value of sodium. Generally, foods with less than 300 milligrams (mg) of sodium per serving fit into this eating plan. To find whole grains, look for the word "whole" as the first word in the ingredient list. Shopping Buy products labeled as "low-sodium" or "no salt added." Buy fresh foods. Avoid canned foods and pre-made or frozen meals. Cooking Avoid adding salt when cooking. Use salt-free seasonings or herbs instead of table salt or sea salt. Check with your health care provider or pharmacist before using salt substitutes. Do not fry foods. Cook foods using healthy methods such as baking, boiling, grilling, roasting, and broiling instead. Cook with heart-healthy oils, such as olive, canola, avocado, soybean, or sunflower oil. Meal planning  Eat a balanced diet that includes: 4 or more servings of fruits and 4 or more servings of vegetables each day. Try to fill one-half of your plate with fruits and vegetables. 6-8 servings of whole grains each day. Less than 6 oz (170 g) of lean meat, poultry, or fish each day. A 3-oz (85-g) serving of meat is about the same size as a deck of cards. One egg equals 1 oz (28 g). 2-3 servings of low-fat dairy each day. One serving is 1 cup (237 mL). 1 serving of nuts, seeds, or  beans 5 times each week. 2-3 servings of heart-healthy fats. Healthy fats called omega-3 fatty acids are found in foods such as walnuts, flaxseeds, fortified milks, and eggs. These fats are also found in cold-water fish, such as sardines, salmon, and mackerel. Limit how much you eat of: Canned or prepackaged foods. Food that is high in trans fat, such as some fried foods. Food that is high in saturated fat, such as fatty meat. Desserts and other sweets, sugary drinks, and other foods with added sugar. Full-fat dairy products. Do not salt foods before eating. Do not eat more than 4 egg yolks a week. Try to eat at least 2 vegetarian meals a week. Eat more home-cooked food and less restaurant, buffet, and fast food. Lifestyle When eating at a restaurant, ask that your food be prepared with less salt or no salt, if possible. If you drink alcohol: Limit how much you use to: 0-1 drink a day for women who are not pregnant. 0-2 drinks a day for men. Be aware of how much alcohol is in your drink. In the [place] one drink equals one 12 oz bottle of beer (355 mL), one 5 oz glass of wine (148 mL), or one 1 oz glass of hard liquor (44 mL). General information Avoid eating more than 2,300 mg of salt a day. If you have hypertension, you may need to reduce your sodium intake to 1,500 mg a day. Work with your health care provider to maintain a healthy body weight or to lose  weight. Ask what an ideal weight is for you. Get at least 30 minutes of exercise that causes your heart to beat faster (aerobic exercise) most days of the week. Activities may include walking, swimming, or biking. Work with your health care provider or dietitian to adjust your eating plan to your individual calorie needs. What foods should I eat? Fruits All fresh, dried, or frozen fruit. Canned fruit in natural juice (without added sugar). Vegetables Fresh or frozen vegetables (raw, steamed, roasted, or grilled). Low-sodium or  reduced-sodium tomato and vegetable juice. Low-sodium or reduced-sodium tomato sauce and tomato paste. Low-sodium or reduced-sodium canned vegetables. Grains Whole-grain or whole-wheat bread. Whole-grain or whole-wheat pasta. Brown rice. [place]. Bulgur. Whole-grain and low-sodium cereals. Pita bread. Low-fat, low-sodium crackers. Whole-wheat flour tortillas. Meats and other proteins Skinless chicken or [place]. Ground chicken or [place]. Pork with fat trimmed off. Fish and seafood. Egg whites. Dried beans, peas, or lentils. Unsalted nuts, nut butters, and seeds. Unsalted canned beans. Lean cuts of beef with fat trimmed off. Low-sodium, lean precooked or cured meat, such as sausages or meat loaves. Dairy Low-fat (1%) or fat-free (skim) milk. Reduced-fat, low-fat, or fat-free cheeses. Nonfat, low-sodium ricotta or cottage cheese. Low-fat or nonfat yogurt. Low-fat, low-sodium cheese. Fats and oils Soft margarine without trans fats. Vegetable oil. Reduced-fat, low-fat, or light mayonnaise and salad dressings (reduced-sodium). Canola, safflower, olive, avocado, soybean, and sunflower oils. Avocado. Seasonings and condiments Herbs. Spices. Seasoning mixes without salt. Other foods Unsalted popcorn and pretzels. Fat-free sweets. The items listed above may not be a complete list of foods and beverages you can eat. Contact a dietitian for more information. What foods should I avoid? Fruits Canned fruit in a light or heavy syrup. Fried fruit. Fruit in cream or butter sauce. Vegetables Creamed or fried vegetables. Vegetables in a cheese sauce. Regular canned vegetables (not low-sodium or reduced-sodium). Regular canned tomato sauce and paste (not low-sodium or reduced-sodium). Regular tomato and vegetable juice (not low-sodium or reduced-sodium). Angie Fava. Olives. Grains Baked goods made with fat, such as croissants, muffins, or some breads. Dry pasta or rice meal packs. Meats and other  proteins Fatty cuts of meat. Ribs. Fried meat. Berniece Salines. Bologna, salami, and other precooked or cured meats, such as sausages or meat loaves. Fat from the back of a pig (fatback). Bratwurst. Salted nuts and seeds. Canned beans with added salt. Canned or smoked fish. Whole eggs or egg yolks. Chicken or [place] with skin. Dairy Whole or 2% milk, cream, and half-and-half. Whole or full-fat cream cheese. Whole-fat or sweetened yogurt. Full-fat cheese. Nondairy creamers. Whipped toppings. Processed cheese and cheese spreads. Fats and oils Butter. Stick margarine. Lard. Shortening. Ghee. Bacon fat. Tropical oils, such as coconut, palm kernel, or palm oil. Seasonings and condiments Onion salt, garlic salt, seasoned salt, table salt, and sea salt. Worcestershire sauce. Tartar sauce. Barbecue sauce. Teriyaki sauce. Soy sauce, including reduced-sodium. Steak sauce. Canned and packaged gravies. Fish sauce. Oyster sauce. Cocktail sauce. Store-bought horseradish. Ketchup. Mustard. Meat flavorings and tenderizers. Bouillon cubes. Hot sauces. Pre-made or packaged marinades. Pre-made or packaged taco seasonings. Relishes. Regular salad dressings. Other foods Salted popcorn and pretzels. The items listed above may not be a complete list of foods and beverages you should avoid. Contact a dietitian for more information. Where to find more information National Heart, Lung, and Blood Institute: https://wilson-eaton.com/ American Heart Association: www.heart.org Academy of Nutrition and Dietetics: www.eatright.[place]: www.kidney.org Summary The DASH eating plan is a healthy eating plan that has been shown  to reduce high blood pressure (hypertension). It may also reduce your risk for type 2 diabetes, heart disease, and stroke. When on the DASH eating plan, aim to eat more fresh fruits and vegetables, whole grains, lean proteins, low-fat dairy, and heart-healthy fats. With the DASH eating plan, you should  limit salt (sodium) intake to 2,300 mg a day. If you have hypertension, you may need to reduce your sodium intake to 1,500 mg a day. Work with your health care provider or dietitian to adjust your eating plan to your individual calorie needs. This information is not intended to replace advice given to you by your health care provider. Make sure you discuss any questions you have with your health care provider. Document Revised: 01/19/2019 Document Reviewed: 01/19/2019 Elsevier Patient Education  [name].  Dizziness Dizziness is a common problem. It is a feeling of unsteadiness or light-headedness. You may feel like you are about to faint. Dizziness can lead to injury if you stumble or fall. Anyone can become dizzy, but dizziness is more common in older adults. This condition can be caused by a number of things, including medicines, dehydration, or illness. Follow these instructions at home: Eating and drinking  Drink enough fluid to keep your urine pale yellow. This helps to keep you from becoming dehydrated. Try to drink more clear fluids, such as water. Do not drink alcohol. Limit your caffeine intake if told to do so by your health care provider. Check ingredients and nutrition facts to see if a food or beverage contains caffeine. Limit your salt (sodium) intake if told to do so by your health care provider. Check ingredients and nutrition facts to see if a food or beverage contains sodium. Activity  Avoid making quick movements. Rise slowly from chairs and steady yourself until you feel okay. In the morning, first sit up on the side of the bed. When you feel okay, stand slowly while you hold onto something until you know that your balance is good. If you need to stand in one place for a long time, move your legs often. Tighten and relax the muscles in your legs while you are standing. Do not drive or use machinery if you feel dizzy. Avoid bending down if you feel dizzy. Place  items in your home so that they are easy for you to reach without leaning over. Lifestyle Do not use any products that contain nicotine or tobacco. These products include cigarettes, chewing tobacco, and vaping devices, such as e-cigarettes. If you need help quitting, ask your health care provider. Try to reduce your stress level by using methods such as yoga or meditation. Talk with your health care provider if you need help to manage your stress. General instructions Watch your dizziness for any changes. Take over-the-counter and prescription medicines only as told by your health care provider. Talk with your health care provider if you think that your dizziness is caused by a medicine that you are taking. Tell a friend or a family member that you are feeling dizzy. If he or she notices any changes in your behavior, have this person call your health care provider. Keep all follow-up visits. This is important. Contact a health care provider if: Your dizziness does not go away or you have new symptoms. Your dizziness or light-headedness gets worse. You feel nauseous. You have reduced hearing. You have a fever. You have neck pain or a stiff neck. Your dizziness leads to an injury or a fall. Get help right away  if: You vomit or have diarrhea and are unable to eat or drink anything. You have problems talking, walking, swallowing, or using your arms, hands, or legs. You feel generally weak. You have any bleeding. You are not thinking clearly or you have trouble forming sentences. It may take a friend or family member to notice this. You have chest pain, abdominal pain, shortness of breath, or sweating. Your vision changes or you develop a severe headache. These symptoms may represent a serious problem that is an emergency. Do not wait to see if the symptoms will go away. Get medical help right away. Call your local emergency services (911 in the [place] Do not drive yourself to the  hospital. Summary Dizziness is a feeling of unsteadiness or light-headedness. This condition can be caused by a number of things, including medicines, dehydration, or illness. Anyone can become dizzy, but dizziness is more common in older adults. Drink enough fluid to keep your urine pale yellow. Do not drink alcohol. Avoid making quick movements if you feel dizzy. Monitor your dizziness for any changes. This information is not intended to replace advice given to you by your health care provider. Make sure you discuss any questions you have with your health care provider. Document Revised: 01/21/2020 Document Reviewed: 01/21/2020 Elsevier Patient Education  [place].
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
## 2021-08-14 ENCOUNTER — Other Ambulatory Visit (INDEPENDENT_AMBULATORY_CARE_PROVIDER_SITE_OTHER): Payer: 59

## 2021-08-14 ENCOUNTER — Other Ambulatory Visit: Payer: Self-pay | Admitting: Internal Medicine

## 2021-08-14 ENCOUNTER — Encounter: Payer: Self-pay | Admitting: Urology

## 2021-08-14 ENCOUNTER — Ambulatory Visit (INDEPENDENT_AMBULATORY_CARE_PROVIDER_SITE_OTHER): Payer: 59

## 2021-08-14 ENCOUNTER — Ambulatory Visit
Admission: RE | Admit: 2021-08-14 | Discharge: 2021-08-14 | Disposition: A | Discharge status: Home / Self Care | Payer: 59 | Source: Ambulatory Visit | Attending: Family | Admitting: Family

## 2021-08-14 ENCOUNTER — Ambulatory Visit (INDEPENDENT_AMBULATORY_CARE_PROVIDER_SITE_OTHER): Payer: 59 | Admitting: Urology

## 2021-08-14 VITALS — BP 138/86 | HR 73 | Ht 70.0 in | Wt 198.0 lb

## 2021-08-14 DIAGNOSIS — R0602 Shortness of breath: Secondary | ICD-10-CM | POA: Insufficient documentation

## 2021-08-14 DIAGNOSIS — R42 Dizziness and giddiness: Secondary | ICD-10-CM | POA: Insufficient documentation

## 2021-08-14 DIAGNOSIS — E785 Hyperlipidemia, unspecified: Secondary | ICD-10-CM | POA: Insufficient documentation

## 2021-08-14 DIAGNOSIS — I10 Essential (primary) hypertension: Secondary | ICD-10-CM | POA: Diagnosis not present

## 2021-08-14 DIAGNOSIS — Z125 Encounter for screening for malignant neoplasm of prostate: Secondary | ICD-10-CM | POA: Diagnosis not present

## 2021-08-14 DIAGNOSIS — R9389 Abnormal findings on diagnostic imaging of other specified body structures: Secondary | ICD-10-CM

## 2021-08-14 DIAGNOSIS — R7989 Other specified abnormal findings of blood chemistry: Secondary | ICD-10-CM | POA: Insufficient documentation

## 2021-08-14 LAB — ECHOCARDIOGRAM COMPLETE
AR max vel: 2.46 cm2
AV Area VTI: 2.3 cm2
AV Area mean vel: 2.33 cm2
AV Mean grad: 5 mmHg
AV Peak grad: 7.1 mmHg
Ao pk vel: 1.33 m/s
Area-P 1/2: 2.92 cm2
S' Lateral: 3.29 cm

## 2021-08-14 IMAGING — DX DG CHEST 2V
2 series · 2 of 2 positions shown · non-contrast
Comparison: [DATE]

CLINICAL DATA: 60-year-old male with prior chest x-ray
demonstrating possible nipple shadows

EXAM:
CHEST - 2 VIEW

[chest pa]
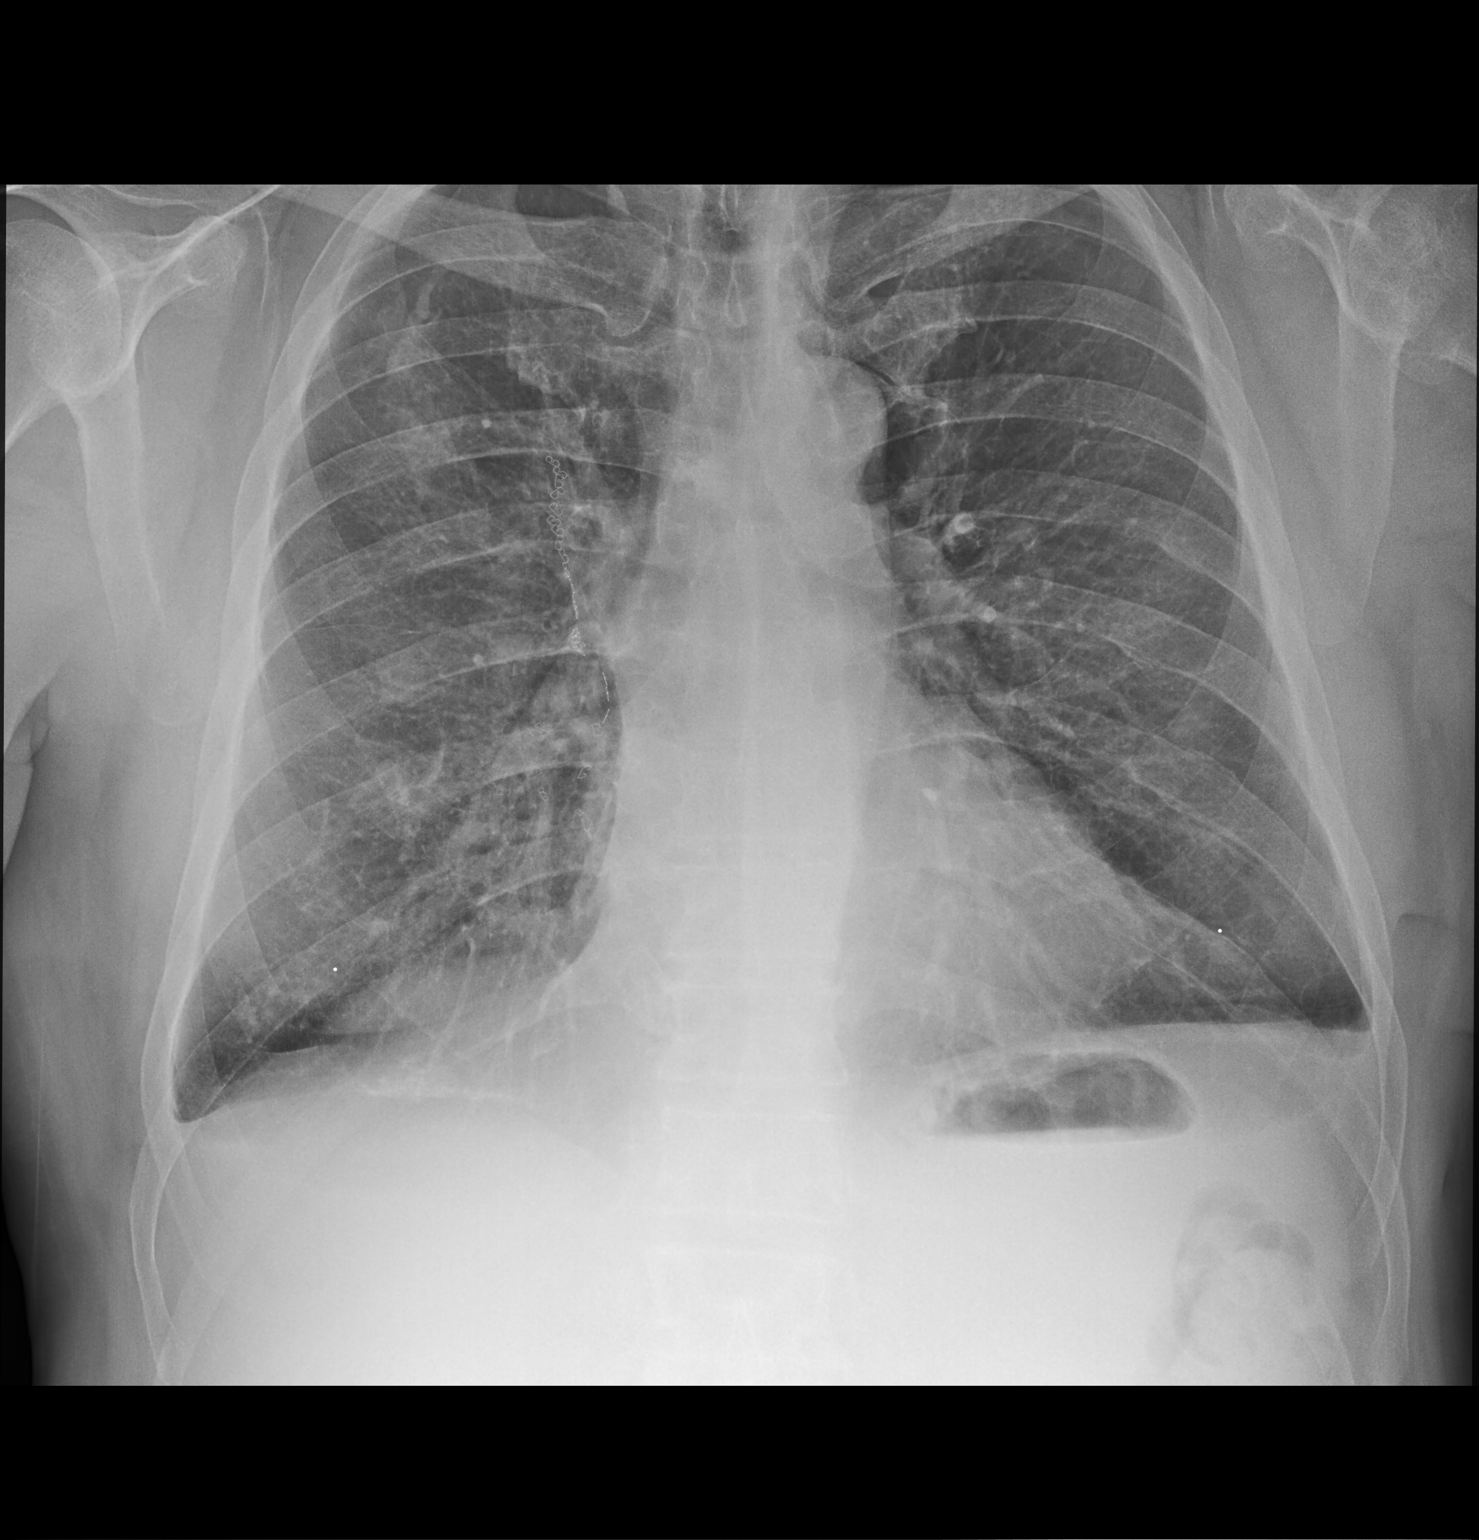

[chest lat]
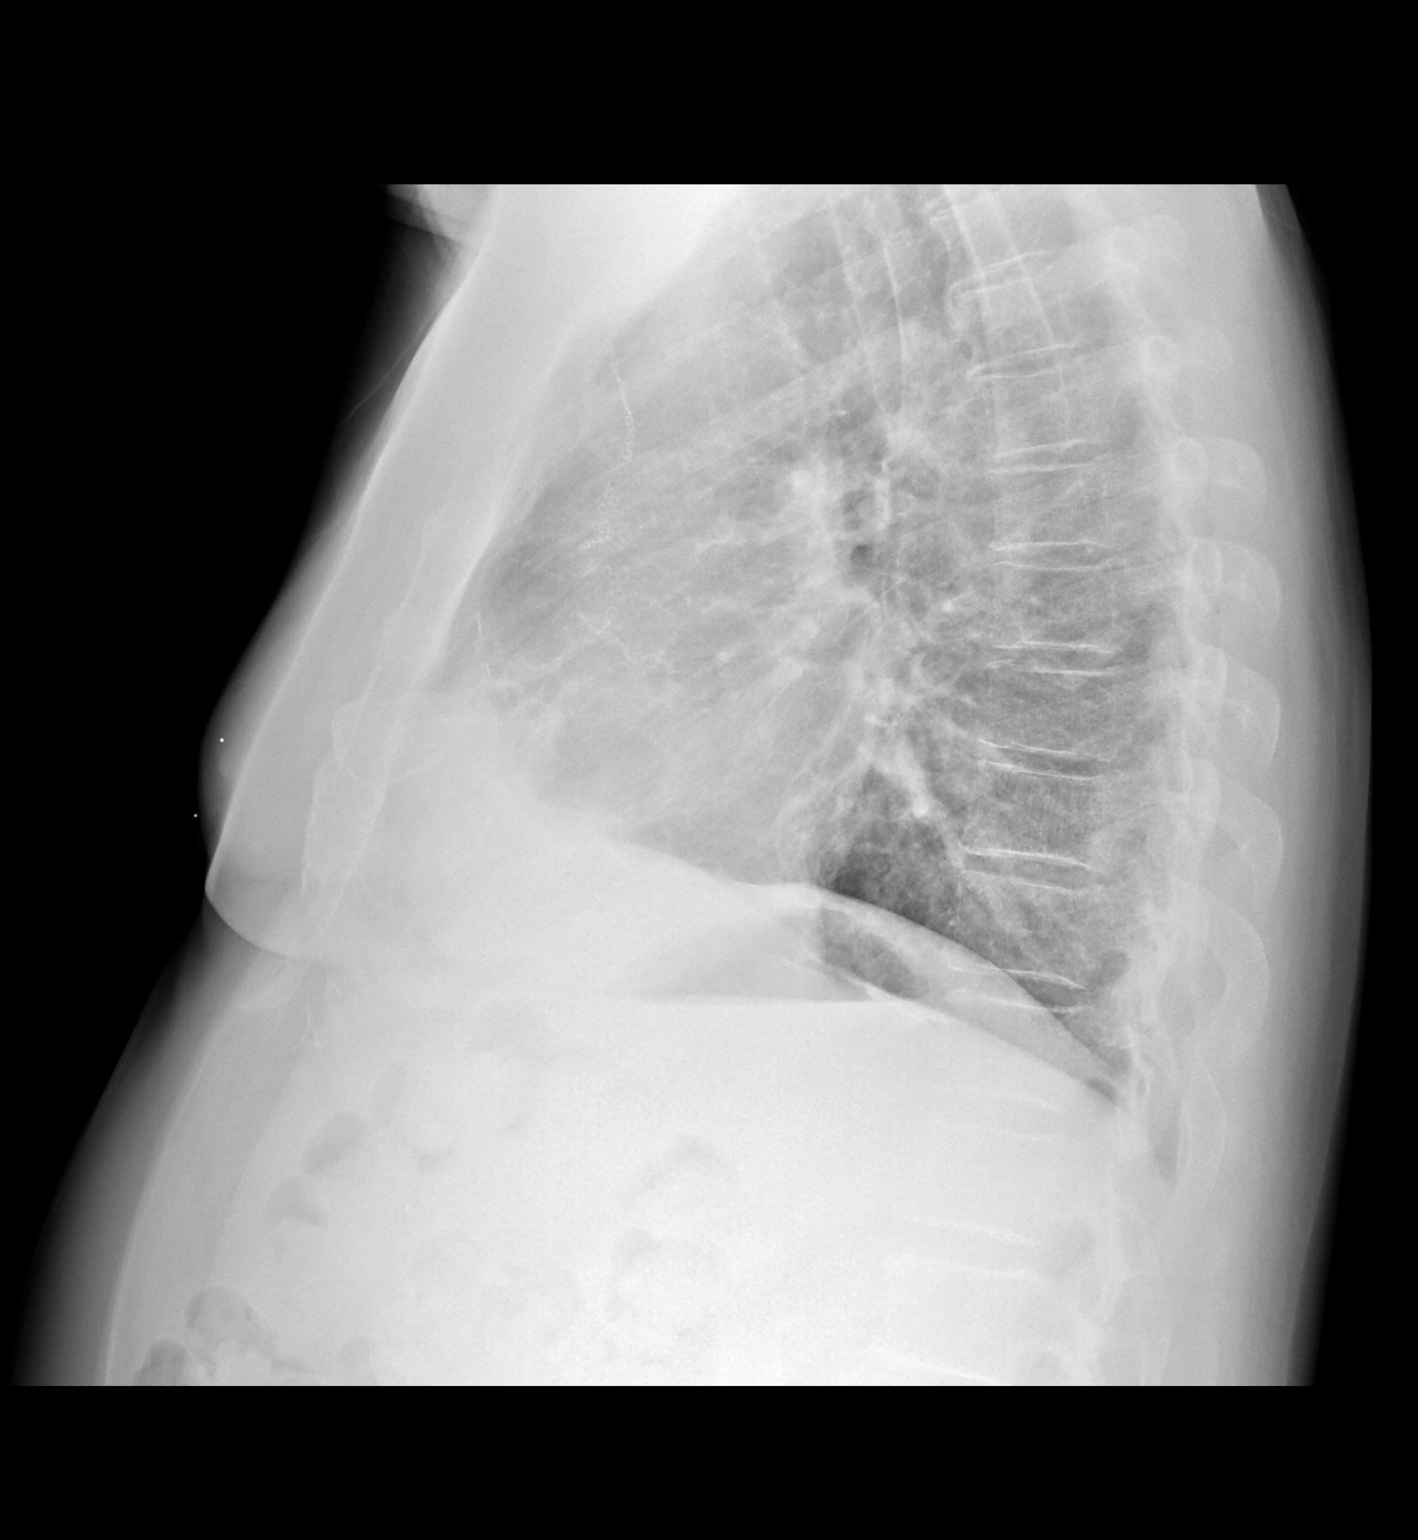

[2 of 2 positions shown; findings below may reference images not displayed]

FINDINGS: Cardiomediastinal silhouette unchanged in size and contour. No
evidence of central vascular congestion. No interlobular septal
thickening.

Nipple markers in the lower chest confirmed that the prior
nodularity correlate with nipple shadows.

Surgical changes in the right hilar region.

Similar appearance of coarsened interstitial markings. No new
confluent airspace disease.

No pneumothorax or pleural effusion.

No acute displaced fracture. Degenerative changes of the spine.
IMPRESSION: Nipple markers confirm nipple shadows at the lung bases.

No acute changes in the appearance of the chest x-ray.
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
## 2021-08-14 NOTE — Progress Notes (Signed)
*  PRELIMINARY RESULTS* Echocardiogram 2D Echocardiogram has been performed.  Jonathan Simon Dempsey Ahonen 08/14/2021, 11:11 AM
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
## 2021-08-16 ENCOUNTER — Other Ambulatory Visit: Payer: Self-pay | Admitting: Family

## 2021-08-16 DIAGNOSIS — I5189 Other ill-defined heart diseases: Secondary | ICD-10-CM

## 2021-08-17 ENCOUNTER — Telehealth: Payer: Self-pay

## 2021-08-17 NOTE — Telephone Encounter (Signed)
Pt returned call and I read message to him. Pt gave verbal understanding

## 2021-08-17 NOTE — Telephone Encounter (Signed)
Called pt to inform in regards to echo & cxr results. Unable to reach nor lvm, vm full.  Per Padonda: I have placed a referral to cardiology to evaluate the heart. The left ventricle is not working as well as it should.   No acute changes to the chest xray

## 2021-08-19 ENCOUNTER — Inpatient Hospital Stay: Payer: 59 | Attending: Oncology | Admitting: Oncology

## 2021-08-19 ENCOUNTER — Other Ambulatory Visit (INDEPENDENT_AMBULATORY_CARE_PROVIDER_SITE_OTHER): Payer: 59

## 2021-08-19 ENCOUNTER — Encounter: Payer: Self-pay | Admitting: Oncology

## 2021-08-19 ENCOUNTER — Telehealth: Payer: Self-pay | Admitting: Internal Medicine

## 2021-08-19 ENCOUNTER — Inpatient Hospital Stay: Payer: 59

## 2021-08-19 DIAGNOSIS — R7989 Other specified abnormal findings of blood chemistry: Secondary | ICD-10-CM | POA: Diagnosis present

## 2021-08-19 DIAGNOSIS — F1010 Alcohol abuse, uncomplicated: Secondary | ICD-10-CM

## 2021-08-19 DIAGNOSIS — I10 Essential (primary) hypertension: Secondary | ICD-10-CM

## 2021-08-19 DIAGNOSIS — R7401 Elevation of levels of liver transaminase levels: Secondary | ICD-10-CM | POA: Diagnosis not present

## 2021-08-19 DIAGNOSIS — R0602 Shortness of breath: Secondary | ICD-10-CM | POA: Insufficient documentation

## 2021-08-19 DIAGNOSIS — E785 Hyperlipidemia, unspecified: Secondary | ICD-10-CM | POA: Diagnosis not present

## 2021-08-19 DIAGNOSIS — I119 Hypertensive heart disease without heart failure: Secondary | ICD-10-CM | POA: Insufficient documentation

## 2021-08-19 LAB — COMPREHENSIVE METABOLIC PANEL
ALT: 151 U/L — ABNORMAL HIGH (ref 0–44)
AST: 107 U/L — ABNORMAL HIGH (ref 15–41)
Albumin: 4.1 g/dL (ref 3.5–5.0)
Alkaline Phosphatase: 64 U/L (ref 38–126)
Anion gap: 7 (ref 5–15)
BUN: 13 mg/dL (ref 8–23)
CO2: 30 mmol/L (ref 22–32)
Calcium: 9.4 mg/dL (ref 8.9–10.3)
Chloride: 103 mmol/L (ref 98–111)
Creatinine, Ser: 0.85 mg/dL (ref 0.61–1.24)
GFR, Estimated: >60 mL/min (ref >60)
Glucose, Bld: 113 mg/dL — ABNORMAL HIGH (ref 70–99)
Potassium: 3.4 mmol/L — ABNORMAL LOW (ref 3.5–5.1)
Sodium: 140 mmol/L (ref 135–145)
Total Bilirubin: 1.1 mg/dL (ref 0.3–1.2)
Total Protein: 7.5 g/dL (ref 6.5–8.1)

## 2021-08-19 LAB — BASIC METABOLIC PANEL
BUN: 12 mg/dL (ref 6–23)
CO2: 32 mEq/L (ref 19–32)
Calcium: 9.6 mg/dL (ref 8.4–10.5)
Chloride: 103 mEq/L (ref 96–112)
Creatinine, Ser: 0.79 mg/dL (ref 0.40–1.50)
GFR: 95 mL/min (ref >60.00)
Glucose, Bld: 82 mg/dL (ref 70–99)
Potassium: 3 mEq/L — ABNORMAL LOW (ref 3.5–5.1)
Sodium: 143 mEq/L (ref 135–145)

## 2021-08-19 LAB — IBC + FERRITIN
Ferritin: 655.4 ng/mL — ABNORMAL HIGH (ref 22.0–322.0)
Iron: 80 ug/dL (ref 42–165)
Saturation Ratios: 24 % (ref 20.0–50.0)
TIBC: 333.2 ug/dL (ref 250.0–450.0)
Transferrin: 238 mg/dL (ref 212.0–360.0)

## 2021-08-19 LAB — CBC WITH DIFFERENTIAL/PLATELET
Abs Immature Granulocytes: 0.01 10*3/uL (ref 0.00–0.07)
Basophils Absolute: 0 10*3/uL (ref 0.0–0.1)
Basophils Relative: 1 %
Eosinophils Absolute: 0.1 10*3/uL (ref 0.0–0.5)
Eosinophils Relative: 1 %
HCT: 41.2 % (ref 39.0–52.0)
Hemoglobin: 14.1 g/dL (ref 13.0–17.0)
Immature Granulocytes: 0 %
Lymphocytes Relative: 16 %
Lymphs Abs: 0.8 10*3/uL (ref 0.7–4.0)
MCH: 34.4 pg — ABNORMAL HIGH (ref 26.0–34.0)
MCHC: 34.2 g/dL (ref 30.0–36.0)
MCV: 100.5 fL — ABNORMAL HIGH (ref 80.0–100.0)
Monocytes Absolute: 1 10*3/uL (ref 0.1–1.0)
Monocytes Relative: 18 %
Neutro Abs: 3.3 10*3/uL (ref 1.7–7.7)
Neutrophils Relative %: 64 %
Platelets: 154 10*3/uL (ref 150–400)
RBC: 4.1 MIL/uL — ABNORMAL LOW (ref 4.22–5.81)
RDW: 16 % — ABNORMAL HIGH (ref 11.5–15.5)
WBC: 5.2 10*3/uL (ref 4.0–10.5)
nRBC: 0 % (ref 0.0–0.2)

## 2021-08-19 LAB — LIPID PANEL
Cholesterol: 140 mg/dL (ref 0–200)
HDL: 56.7 mg/dL (ref >39.00)
LDL Cholesterol: 69 mg/dL (ref 0–99)
NonHDL: 83.63
Total CHOL/HDL Ratio: 2
Triglycerides: 73 mg/dL (ref 0.0–149.0)
VLDL: 14.6 mg/dL (ref 0.0–40.0)

## 2021-08-19 LAB — IRON AND TIBC
Iron: 60 ug/dL (ref 45–182)
Saturation Ratios: 19 % (ref 17.9–39.5)
TIBC: 325 ug/dL (ref 250–450)
UIBC: 265 ug/dL

## 2021-08-19 LAB — TSH: TSH: 1.84 u[IU]/mL (ref 0.35–5.50)

## 2021-08-19 LAB — T4, FREE: Free T4: 0.88 ng/dL (ref 0.60–1.60)

## 2021-08-19 LAB — FERRITIN: Ferritin: 649 ng/mL — ABNORMAL HIGH (ref 24–336)

## 2021-08-19 LAB — T3, FREE: T3, Free: 3.2 pg/mL (ref 2.3–4.2)

## 2021-08-19 NOTE — Telephone Encounter (Signed)
Patient called and said someone from the office called him. He doesn't know the reason for phone call.

## 2021-08-19 NOTE — Progress Notes (Signed)
Patient denies any concerns today.  

## 2021-08-19 NOTE — Telephone Encounter (Signed)
Don't see in the chart where anyone has tried reaching out to him today.

## 2021-08-20 ENCOUNTER — Other Ambulatory Visit: Payer: Self-pay | Admitting: Family

## 2021-08-20 MED ORDER — POTASSIUM CHLORIDE CRYS ER 20 MEQ PO TBCR: 20.0000 meq | EXTENDED_RELEASE_TABLET | Freq: Every day | ORAL | 0 refills | Status: DC

## 2021-08-24 ENCOUNTER — Ambulatory Visit: Payer: 59 | Admitting: Internal Medicine

## 2021-08-24 ENCOUNTER — Encounter: Payer: Self-pay | Admitting: Internal Medicine

## 2021-08-24 DIAGNOSIS — R42 Dizziness and giddiness: Secondary | ICD-10-CM | POA: Diagnosis not present

## 2021-08-24 DIAGNOSIS — K7010 Alcoholic hepatitis without ascites: Secondary | ICD-10-CM

## 2021-08-24 DIAGNOSIS — I951 Orthostatic hypotension: Secondary | ICD-10-CM | POA: Diagnosis not present

## 2021-08-24 DIAGNOSIS — K760 Fatty (change of) liver, not elsewhere classified: Secondary | ICD-10-CM

## 2021-08-24 DIAGNOSIS — I10 Essential (primary) hypertension: Secondary | ICD-10-CM

## 2021-08-24 DIAGNOSIS — F1010 Alcohol abuse, uncomplicated: Secondary | ICD-10-CM

## 2021-08-24 LAB — HEMOCHROMATOSIS DNA-PCR(C282Y,H63D)

## 2021-08-24 NOTE — Assessment & Plan Note (Signed)
Resolved by Repeat assessment today :  Systolic drops 12 pts to 138 systolic. Marland Kitchen  No pulse change (taking 12.5 mg metoprolol )

## 2021-08-26 ENCOUNTER — Ambulatory Visit: Payer: 59

## 2021-08-31 ENCOUNTER — Ambulatory Visit
Admission: RE | Admit: 2021-08-31 | Discharge: 2021-08-31 | Disposition: A | Discharge status: Home / Self Care | Payer: 59 | Source: Ambulatory Visit | Attending: Family | Admitting: Family

## 2021-08-31 DIAGNOSIS — R42 Dizziness and giddiness: Secondary | ICD-10-CM | POA: Insufficient documentation

## 2021-09-04 ENCOUNTER — Other Ambulatory Visit: Payer: 59

## 2021-09-07 ENCOUNTER — Ambulatory Visit: Payer: 59 | Admitting: Gastroenterology

## 2021-09-07 ENCOUNTER — Telehealth: Payer: Self-pay | Admitting: Gastroenterology

## 2021-09-07 NOTE — Telephone Encounter (Signed)
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 Informed patient that only 1 provider did appointments in the morning on Wednesday in [place]. He said that he going to try to get into Dr. Tollie Pizza office to see him because he thinks they do morning appointments.

## 2021-09-07 NOTE — Telephone Encounter (Signed)
Pt has cancelled his appointment for today and was trying to get an earlier appointment

## 2021-09-08 NOTE — Progress Notes (Unsigned)
  [place]  [phone] [phone]  ID: [name] OB: [birth year]  MR#: [id number]  CSN#:[id number]  Patient Care Team: Crecencio Mc, MD as PCP - General (Internal Medicine) Crecencio Mc, MD (Internal Medicine) Bary Castilla Forest Gleason, MD (General Surgery) Lloyd Huger, MD as Consulting Physician (Oncology)  Patient was a no-show for his video visit today.  No follow-up has been rescheduled.  Refer back to primary care.  Lloyd Huger, MD   09/08/2021 7:33 AM    This encounter was created in error - please disregard.

## 2021-09-09 ENCOUNTER — Ambulatory Visit: Payer: 59 | Admitting: Internal Medicine

## 2021-09-10 ENCOUNTER — Inpatient Hospital Stay: Payer: 59 | Attending: Oncology | Admitting: Oncology

## 2021-09-10 ENCOUNTER — Ambulatory Visit: Payer: 59 | Admitting: Dietician

## 2021-09-10 ENCOUNTER — Encounter: Payer: Self-pay | Admitting: Oncology

## 2021-09-10 ENCOUNTER — Encounter: Payer: Self-pay | Admitting: *Deleted

## 2021-09-10 DIAGNOSIS — R7989 Other specified abnormal findings of blood chemistry: Secondary | ICD-10-CM

## 2021-09-10 NOTE — Patient Instructions (Signed)
Dr. Grayland Ormond and nursing unable to reach patient after multiple attempts for virtual visit. Per Dr. Grayland Ormond patient does not need to be rescheduled or need follow up at the cancer center at this time. Update faxed to PCP, Dr. Derrel Nip.

## 2021-09-11 ENCOUNTER — Ambulatory Visit: Payer: 59 | Admitting: Dietician

## 2021-10-05 ENCOUNTER — Encounter: Payer: Self-pay | Admitting: Cardiology

## 2021-10-05 ENCOUNTER — Ambulatory Visit: Payer: 59 | Admitting: Cardiology

## 2021-10-09 ENCOUNTER — Encounter: Payer: Self-pay | Admitting: Dietician

## 2021-10-09 NOTE — Progress Notes (Signed)
Have not heard back from patient to reschedule his missed appointment from 09/11/21 which was rescheduled from 09/10/21. Sent notification to referring provider.

## 2021-10-12 ENCOUNTER — Other Ambulatory Visit: Payer: Self-pay | Admitting: Internal Medicine

## 2021-11-26 ENCOUNTER — Ambulatory Visit: Payer: 59 | Admitting: Internal Medicine

## 2021-12-28 ENCOUNTER — Encounter (INDEPENDENT_AMBULATORY_CARE_PROVIDER_SITE_OTHER): Payer: Self-pay

## 2022-01-29 DIAGNOSIS — Z419 Encounter for procedure for purposes other than remedying health state, unspecified: Secondary | ICD-10-CM | POA: Diagnosis not present

## 2022-03-01 DIAGNOSIS — Z419 Encounter for procedure for purposes other than remedying health state, unspecified: Secondary | ICD-10-CM | POA: Diagnosis not present

## 2022-03-03 ENCOUNTER — Other Ambulatory Visit: Payer: Self-pay | Admitting: Family

## 2022-03-03 DIAGNOSIS — F321 Major depressive disorder, single episode, moderate: Secondary | ICD-10-CM

## 2022-04-01 DIAGNOSIS — Z419 Encounter for procedure for purposes other than remedying health state, unspecified: Secondary | ICD-10-CM | POA: Diagnosis not present

## 2022-04-30 DIAGNOSIS — Z419 Encounter for procedure for purposes other than remedying health state, unspecified: Secondary | ICD-10-CM | POA: Diagnosis not present

## 2022-05-31 DIAGNOSIS — Z419 Encounter for procedure for purposes other than remedying health state, unspecified: Secondary | ICD-10-CM | POA: Diagnosis not present

## 2022-06-30 DIAGNOSIS — Z419 Encounter for procedure for purposes other than remedying health state, unspecified: Secondary | ICD-10-CM | POA: Diagnosis not present

## 2022-07-08 ENCOUNTER — Other Ambulatory Visit: Payer: Self-pay | Admitting: Internal Medicine

## 2022-07-08 NOTE — Telephone Encounter (Signed)
Per chart patient has no-showed last 3 scheduled appts (1 lab, 2 OV).  Refills sent in for 30 days with no refills.  Patient will need to be seen in office for further refills.    FYI - thanks!

## 2022-07-14 NOTE — Telephone Encounter (Signed)
Pt called in to make an appt with provider for his meds. He's booked for 7/3 @10 :30am

## 2022-07-31 DIAGNOSIS — Z419 Encounter for procedure for purposes other than remedying health state, unspecified: Secondary | ICD-10-CM | POA: Diagnosis not present

## 2022-08-26 ENCOUNTER — Emergency Department
Admission: EM | Admit: 2022-08-26 | Discharge: 2022-08-27 | Disposition: A | Discharge status: Home / Self Care | Payer: Medicaid Other | Attending: Emergency Medicine | Admitting: Emergency Medicine

## 2022-08-26 ENCOUNTER — Other Ambulatory Visit: Payer: Self-pay

## 2022-08-26 ENCOUNTER — Emergency Department: Payer: Medicaid Other

## 2022-08-26 DIAGNOSIS — Z8616 Personal history of COVID-19: Secondary | ICD-10-CM | POA: Insufficient documentation

## 2022-08-26 DIAGNOSIS — Y907 Blood alcohol level of 200-239 mg/100 ml: Secondary | ICD-10-CM | POA: Diagnosis not present

## 2022-08-26 DIAGNOSIS — T391X1A Poisoning by 4-Aminophenol derivatives, accidental (unintentional), initial encounter: Secondary | ICD-10-CM | POA: Diagnosis not present

## 2022-08-26 DIAGNOSIS — F329 Major depressive disorder, single episode, unspecified: Secondary | ICD-10-CM | POA: Insufficient documentation

## 2022-08-26 DIAGNOSIS — F1010 Alcohol abuse, uncomplicated: Secondary | ICD-10-CM | POA: Diagnosis not present

## 2022-08-26 DIAGNOSIS — G4700 Insomnia, unspecified: Secondary | ICD-10-CM | POA: Insufficient documentation

## 2022-08-26 DIAGNOSIS — T50901A Poisoning by unspecified drugs, medicaments and biological substances, accidental (unintentional), initial encounter: Secondary | ICD-10-CM | POA: Diagnosis not present

## 2022-08-26 DIAGNOSIS — M542 Cervicalgia: Secondary | ICD-10-CM | POA: Diagnosis not present

## 2022-08-26 DIAGNOSIS — I10 Essential (primary) hypertension: Secondary | ICD-10-CM | POA: Diagnosis not present

## 2022-08-26 DIAGNOSIS — Z79899 Other long term (current) drug therapy: Secondary | ICD-10-CM | POA: Insufficient documentation

## 2022-08-26 DIAGNOSIS — R079 Chest pain, unspecified: Secondary | ICD-10-CM | POA: Diagnosis not present

## 2022-08-26 LAB — COMPREHENSIVE METABOLIC PANEL
ALT: 41 U/L (ref 0–44)
AST: 51 U/L — ABNORMAL HIGH (ref 15–41)
Albumin: 4.6 g/dL (ref 3.5–5.0)
Alkaline Phosphatase: 76 U/L (ref 38–126)
Anion gap: 17 — ABNORMAL HIGH (ref 5–15)
BUN: 10 mg/dL (ref 8–23)
CO2: 22 mmol/L (ref 22–32)
Calcium: 9.4 mg/dL (ref 8.9–10.3)
Chloride: 99 mmol/L (ref 98–111)
Creatinine, Ser: 0.76 mg/dL (ref 0.61–1.24)
GFR, Estimated: >60 mL/min (ref >60)
Glucose, Bld: 141 mg/dL — ABNORMAL HIGH (ref 70–99)
Potassium: 3.7 mmol/L (ref 3.5–5.1)
Sodium: 138 mmol/L (ref 135–145)
Total Bilirubin: 1.1 mg/dL (ref 0.3–1.2)
Total Protein: 8 g/dL (ref 6.5–8.1)

## 2022-08-26 LAB — CBC
HCT: 47 % (ref 39.0–52.0)
Hemoglobin: 15.7 g/dL (ref 13.0–17.0)
MCH: 31.5 pg (ref 26.0–34.0)
MCHC: 33.4 g/dL (ref 30.0–36.0)
MCV: 94.2 fL (ref 80.0–100.0)
Platelets: 348 10*3/uL (ref 150–400)
RBC: 4.99 MIL/uL (ref 4.22–5.81)
RDW: 14.1 % (ref 11.5–15.5)
WBC: 8.1 10*3/uL (ref 4.0–10.5)
nRBC: 0 % (ref 0.0–0.2)

## 2022-08-26 LAB — ETHANOL: Alcohol, Ethyl (B): 206 mg/dL — ABNORMAL HIGH (ref <10)

## 2022-08-26 LAB — TROPONIN I (HIGH SENSITIVITY)
Troponin I (High Sensitivity): 6 ng/L (ref <18)
Troponin I (High Sensitivity): 8 ng/L (ref <18)

## 2022-08-26 LAB — SALICYLATE LEVEL: Salicylate Lvl: <7 mg/dL — ABNORMAL LOW (ref 7.0–30.0)

## 2022-08-26 LAB — ACETAMINOPHEN LEVEL
Acetaminophen (Tylenol), Serum: 16 ug/mL (ref 10–30)
Acetaminophen (Tylenol), Serum: <10 ug/mL — ABNORMAL LOW (ref 10–30)

## 2022-08-26 MED ORDER — ONDANSETRON HCL 4 MG/2ML IJ SOLN
4.0000 mg | Freq: Once | INTRAMUSCULAR | Status: AC
  Administered 2022-08-26: 4 mg via INTRAVENOUS
  Filled 2022-08-26: qty 2

## 2022-08-26 MED ORDER — ONDANSETRON HCL 4 MG/2ML IJ SOLN
4.0000 mg | Freq: Once | INTRAMUSCULAR | Status: AC
  Administered 2022-08-26: 4 mg via INTRAVENOUS

## 2022-08-26 MED ORDER — SODIUM CHLORIDE 0.9 % IV BOLUS
1000.0000 mL | Freq: Once | INTRAVENOUS | Status: AC
  Administered 2022-08-26: 1000 mL via INTRAVENOUS

## 2022-08-26 NOTE — ED Notes (Addendum)
Pt belongings include: White shoes Navy pants [employer]  Cellphone [employer]   All belongings sent with brother.

## 2022-08-26 NOTE — ED Provider Notes (Signed)
[facility] Provider Note    Event Date/Time   First MD Initiated Contact with Patient 08/26/22 1735     (approximate)   History   Drug Overdose   HPI  Jonathan Simon is a 62 y.o. male  who presents to the emergency department after overdose of tylenol at 1530. The patient says he took them to try to help sleep. Patient has history of depression. Says he has been self medicating for a long time with alcohol. Has decided however that he needs to stop. At the time of my exam the patient is complaining of some nausea.        Physical Exam   Triage Vital Signs: ED Triage Vitals  Enc Vitals Group     BP 08/26/22 1706 (!) 132/109     Pulse Rate 08/26/22 1706 (!) 104     Resp 08/26/22 1706 20     Temp 08/26/22 1708 98.3 F (36.8 C)     Temp src --      SpO2 08/26/22 1706 99 %     Weight 08/26/22 1707 210 lb (95.3 kg)     Height 08/26/22 1707 5\' 11"  (1.803 m)     Head Circumference --      Peak Flow --      Pain Score 08/26/22 1707 5     Pain Loc --      Pain Edu? --      Excl. in GC? --     Most recent vital signs: Vitals:   08/26/22 1706 08/26/22 1708  BP: (!) 132/109   Pulse: (!) 104   Resp: 20   Temp:  98.3 F (36.8 C)  SpO2: 99%     General: Awake, alert, depressed. CV:  Good peripheral perfusion.  Resp:  Normal effort.  Abd:  No distention. Non tender.   ED Results / Procedures / Treatments   Labs (all labs ordered are listed, but only abnormal results are displayed) Labs Reviewed  COMPREHENSIVE METABOLIC PANEL - Abnormal; Notable for the following components:      Result Value   Glucose, Bld 141 (*)    AST 51 (*)    Anion gap 17 (*)    All other components within normal limits  SALICYLATE LEVEL - Abnormal; Notable for the following components:   Salicylate Lvl <7.0 (*)    All other components within normal limits  ETHANOL - Abnormal; Notable for the following components:   Alcohol, Ethyl (B) 206 (*)    All other  components within normal limits  ACETAMINOPHEN LEVEL  CBC  URINE DRUG SCREEN, QUALITATIVE ([facility] ONLY)  TROPONIN I (HIGH SENSITIVITY)     EKG  I, Phineas Semen, attending physician, personally viewed and interpreted this EKG  EKG Time: 1711 Rate: 89 Rhythm: normal sinus rhythm Axis: left axis deviation Intervals: qtc 455 QRS: incomplete RBBB ST changes: no st elevation Impression: abnormal ekg   RADIOLOGY I independently interpreted and visualized the CXR. My interpretation: No pneumonia Radiology interpretation:  IMPRESSION:  There are no new infiltrates or signs of pulmonary edema.      PROCEDURES:  Critical Care performed: No    MEDICATIONS ORDERED IN ED: Medications - No data to display   IMPRESSION / MDM / ASSESSMENT AND PLAN / ED COURSE  I reviewed the triage vital signs and the nursing notes.  Differential diagnosis includes, but is not limited to, tylenol overdose, depression, alcohol abuse  Patient's presentation is most consistent with acute presentation with potential threat to life or bodily function.  Patient presented to the emergency department today because of concerns for Tylenol overdose as well as depression.  Patient's Tylenol levels likely below threshold for treatment.  At this time patient denies that the Tylenol was taken in intent of self-harm.  Will have psychiatry evaluate.     FINAL CLINICAL IMPRESSION(S) / ED DIAGNOSES   Accidental Overdose Depression   Note:  This document was prepared using Dragon voice recognition software and may include unintentional dictation errors.    Phineas Semen, MD 08/26/22 2256

## 2022-08-26 NOTE — ED Triage Notes (Signed)
Pt to ED for taking 4 tylenol PM, unsure of dosage, took at 1530. States took these to sleep, not with intent to harm. However, pt endorses depression and states needs to speak to someone. C/o chest and neck pain started 15 mins ago. +chronic shob.   Last ETOH this am.

## 2022-08-26 NOTE — ED Notes (Addendum)
Spoke to [place] with poison control.  After medically cleared regarding chest pain: Look drowsiness, hypotension, tachycardia, urinary retention 4 hour acetaminophen level, observe 4-6 hours
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
## 2022-08-27 MED ORDER — ONDANSETRON HCL 4 MG/2ML IJ SOLN
4.0000 mg | Freq: Once | INTRAMUSCULAR | Status: AC
  Administered 2022-08-27: 4 mg via INTRAVENOUS
  Filled 2022-08-27: qty 2

## 2022-08-27 NOTE — Discharge Instructions (Addendum)
You were seen in the emergency department today after taking Tylenol.  Please only take medications as directed and with appropriate dosing.  Follow-up with your outpatient providers for further evaluation.  Return to the ER for any new or worsening symptoms.

## 2022-08-27 NOTE — ED Notes (Addendum)
Pt a/o, no complaints. Ride is here, pt ready for discharge.
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
## 2022-08-27 NOTE — ED Notes (Signed)
Various needs addressed throughout the night - see MAR. Pt has been cleared by psych and is awaiting sister to pick him up.

## 2022-08-27 NOTE — ED Notes (Addendum)
Pt talking to "[place]" via tele-psych visit.

## 2022-08-27 NOTE — ED Provider Notes (Signed)
Notified by RN that patient has been cleared by psychiatry and sister is on the way.  I did review the patient's chart.  Presented with Tylenol ingestion without intent of self-harm.  Psychiatry was consulted who did clear the patient for discharge.  Initial Tylenol level 16, now undetectable.  EtOH elevated at 206, but patient has had over 12 hours metabolization at this point.  Per triage note, initial report of chest pain, has had 2 negative troponins as well as an EKG without acute ischemic findings.  Do think patient is stable for discharge home.  Strict return precautions provided.   Trinna Post, MD 08/27/22 [phone]

## 2022-09-01 ENCOUNTER — Telehealth (INDEPENDENT_AMBULATORY_CARE_PROVIDER_SITE_OTHER): Payer: Medicaid Other | Admitting: Internal Medicine

## 2022-09-01 DIAGNOSIS — F1010 Alcohol abuse, uncomplicated: Secondary | ICD-10-CM | POA: Diagnosis not present

## 2022-09-01 DIAGNOSIS — F10982 Alcohol use, unspecified with alcohol-induced sleep disorder: Secondary | ICD-10-CM

## 2022-09-01 DIAGNOSIS — F32A Depression, unspecified: Secondary | ICD-10-CM

## 2022-09-01 DIAGNOSIS — R7989 Other specified abnormal findings of blood chemistry: Secondary | ICD-10-CM | POA: Diagnosis not present

## 2022-09-01 DIAGNOSIS — F419 Anxiety disorder, unspecified: Secondary | ICD-10-CM | POA: Diagnosis not present

## 2022-09-01 NOTE — Assessment & Plan Note (Signed)
Secondary to alcohol and tylenol use.

## 2022-09-01 NOTE — Assessment & Plan Note (Signed)
Referral to [facility] psychology ;  strongly encouraged to meet with his Eli Green to discuss his guilt;  follow up in office ASAP  to start antidepressant

## 2022-09-01 NOTE — Assessment & Plan Note (Signed)
He has been sober for 5 days  days,  but has not attended AA or had therapy yet.  Encouraged him to attend AA

## 2022-09-01 NOTE — Patient Instructions (Signed)
Please go talk to your pastor about your guilt   I will make a referral to [facility] psychology department ofr counselling
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
## 2022-09-07 ENCOUNTER — Other Ambulatory Visit: Payer: Self-pay | Admitting: Internal Medicine

## 2022-09-07 DIAGNOSIS — I10 Essential (primary) hypertension: Secondary | ICD-10-CM

## 2022-10-05 ENCOUNTER — Telehealth: Payer: Medicaid Other | Admitting: Internal Medicine

## 2022-11-05 ENCOUNTER — Other Ambulatory Visit: Payer: Self-pay | Admitting: Family

## 2022-11-05 ENCOUNTER — Other Ambulatory Visit: Payer: Self-pay | Admitting: Internal Medicine

## 2022-11-05 DIAGNOSIS — I10 Essential (primary) hypertension: Secondary | ICD-10-CM

## 2022-11-05 DIAGNOSIS — F321 Major depressive disorder, single episode, moderate: Secondary | ICD-10-CM

## 2022-11-09 ENCOUNTER — Other Ambulatory Visit: Payer: Self-pay

## 2022-12-28 ENCOUNTER — Other Ambulatory Visit: Payer: Self-pay | Admitting: Internal Medicine

## 2023-12-14 ENCOUNTER — Ambulatory Visit: Admitting: Internal Medicine

## 2023-12-14 ENCOUNTER — Encounter: Payer: Self-pay | Admitting: Internal Medicine

## 2023-12-14 VITALS — BP 170/96 | HR 68 | Ht 71.0 in | Wt 208.0 lb

## 2023-12-14 DIAGNOSIS — J410 Simple chronic bronchitis: Secondary | ICD-10-CM | POA: Diagnosis not present

## 2023-12-14 DIAGNOSIS — F1011 Alcohol abuse, in remission: Secondary | ICD-10-CM

## 2023-12-14 DIAGNOSIS — D126 Benign neoplasm of colon, unspecified: Secondary | ICD-10-CM

## 2023-12-14 DIAGNOSIS — F321 Major depressive disorder, single episode, moderate: Secondary | ICD-10-CM

## 2023-12-14 DIAGNOSIS — R7301 Impaired fasting glucose: Secondary | ICD-10-CM | POA: Diagnosis not present

## 2023-12-14 DIAGNOSIS — J439 Emphysema, unspecified: Secondary | ICD-10-CM

## 2023-12-14 DIAGNOSIS — R7989 Other specified abnormal findings of blood chemistry: Secondary | ICD-10-CM

## 2023-12-14 DIAGNOSIS — E876 Hypokalemia: Secondary | ICD-10-CM

## 2023-12-14 DIAGNOSIS — Z0000 Encounter for general adult medical examination without abnormal findings: Secondary | ICD-10-CM | POA: Diagnosis not present

## 2023-12-14 DIAGNOSIS — Z125 Encounter for screening for malignant neoplasm of prostate: Secondary | ICD-10-CM | POA: Diagnosis not present

## 2023-12-14 DIAGNOSIS — F5101 Primary insomnia: Secondary | ICD-10-CM

## 2023-12-14 DIAGNOSIS — E785 Hyperlipidemia, unspecified: Secondary | ICD-10-CM | POA: Diagnosis not present

## 2023-12-14 DIAGNOSIS — I700 Atherosclerosis of aorta: Secondary | ICD-10-CM

## 2023-12-14 DIAGNOSIS — G950 Syringomyelia and syringobulbia: Secondary | ICD-10-CM

## 2023-12-14 DIAGNOSIS — R5383 Other fatigue: Secondary | ICD-10-CM

## 2023-12-14 DIAGNOSIS — K7010 Alcoholic hepatitis without ascites: Secondary | ICD-10-CM

## 2023-12-14 DIAGNOSIS — K760 Fatty (change of) liver, not elsewhere classified: Secondary | ICD-10-CM

## 2023-12-14 DIAGNOSIS — I10 Essential (primary) hypertension: Secondary | ICD-10-CM | POA: Diagnosis not present

## 2023-12-14 DIAGNOSIS — R972 Elevated prostate specific antigen [PSA]: Secondary | ICD-10-CM

## 2023-12-14 MED ORDER — ROSUVASTATIN CALCIUM 10 MG PO TABS: 10.0000 mg | ORAL_TABLET | Freq: Every day | ORAL | 1 refills | Status: DC

## 2023-12-14 MED ORDER — LOSARTAN POTASSIUM 100 MG PO TABS: 100.0000 mg | ORAL_TABLET | Freq: Every day | ORAL | 1 refills | Status: DC

## 2023-12-14 MED ORDER — AMLODIPINE BESYLATE 10 MG PO TABS: 10.0000 mg | ORAL_TABLET | Freq: Every day | ORAL | 1 refills | Status: DC

## 2023-12-14 MED ORDER — TETANUS-DIPHTH-ACELL PERTUSSIS 5-2-15.5 LF-MCG/0.5 IM SUSP
0.5000 mL | Freq: Once | INTRAMUSCULAR | 0 refills | Status: AC
Start: 2023-12-14 — End: 2023-12-14

## 2023-12-14 MED ORDER — TRAZODONE HCL 50 MG PO TABS: 50.0000 mg | ORAL_TABLET | Freq: Every day | ORAL | 1 refills | Status: DC

## 2023-12-14 NOTE — Assessment & Plan Note (Signed)
 Uncontrolled, untreated.  Resume amlodipine  and aldactone  first.  Followed by losartan  in one week for bp > 130/80.  Needs RN visit one month for bp check

## 2023-12-14 NOTE — Assessment & Plan Note (Signed)
 He has been sober since Nov 2024 ,  abstains from alcohol all but one day per month

## 2023-12-14 NOTE — Assessment & Plan Note (Signed)
 PSA is normal  Lab Results  Component Value Date   PSA 1.23 12/14/2023   PSA 1.15 07/29/2021   PSA 0.79 07/07/2018

## 2023-12-14 NOTE — Progress Notes (Unsigned)
 Patient ID: Jonathan Simon, male    DOB: Oct 01, 1960  Age: 63 y.o. MRN: [id number]  The patient is here for annual preventive examination and management of other chronic and acute problems.   The risk factors are reflected in the social history.   The roster of all physicians providing medical care to patient - is listed in the Snapshot section of the chart.   Activities of daily living:  The patient is 100% independent in all ADLs: dressing, toileting, feeding as well as independent mobility   Home safety : The patient has smoke detectors in the home. They wear seatbelts.  There are no unsecured firearms at home. There is no violence in the home.    There is no risks for hepatitis, STDs or HIV. There is no   history of blood transfusion. They have no travel history to infectious disease endemic areas of the world.   The patient has seen their dentist in the last six month. They have seen their eye doctor in the last year. The patinet  denies slight hearing difficulty with regard to whispered voices and some television programs.  They have deferred audiologic testing in the last year.  They do not  have excessive sun exposure. Discussed the need for sun protection: hats, long sleeves and use of sunscreen if there is significant sun exposure.    Diet: the importance of a healthy diet is discussed. They do have a healthy diet.   The benefits of regular aerobic exercise were discussed. The patient  exercises  3 to 5 days per week  for  60 minutes.    Depression screen: there are no signs or vegative symptoms of depression- irritability, change in appetite, anhedonia, sadness/tearfullness.   The following portions of the patient's history were reviewed and updated as appropriate: allergies, current medications, past family history, past medical history,  past surgical history, past social history  and problem list.   Visual acuity was not assessed per patient preference since the patient has  regular follow up with an  ophthalmologist. Hearing and body mass index were assessed and reviewed.    During the course of the visit the patient was educated and counseled about appropriate screening and preventive services including : fall prevention , diabetes screening, nutrition counseling, colorectal cancer screening, and recommended immunizations.    Chief Complaint:  1) HTN:  has not refilled his medications (per pharmacy) since March 2025,  90 days supply give in March   2) alcohol abuse:  reviewed ER evaluation August 26 2022  for ETOH/tylenol  overdose uninentional . Had televisit with me in July,  referred to psychology .  Has been limiting alcohol to once a month  working out  on a regular basis   3) Depression       Review of Symptoms  Patient denies headache, fevers, malaise, unintentional weight loss, skin rash, eye pain, sinus congestion and sinus pain, sore throat, dysphagia,  hemoptysis , cough, dyspnea, wheezing, chest pain, palpitations, orthopnea, edema, abdominal pain, nausea, melena, diarrhea, constipation, flank pain, dysuria, hematuria, urinary  Frequency, nocturia, numbness, tingling, seizures,  Focal weakness, Loss of consciousness,  Tremor, insomnia, depression, anxiety, and suicidal ideation.    Physical Exam:  BP (!) 170/96   Pulse 68   Ht 5' 11 (1.803 m)   Wt 208 lb (94.3 kg)   SpO2 96%   BMI 29.01 kg/m    Physical Exam  Assessment and Plan: Hyperlipidemia with target LDL less than 100  Primary  hypertension  Current moderate episode of major depressive disorder, unspecified whether recurrent (HCC)  Encounter for preventive health examination  Impaired fasting glucose  Other fatigue  Prostate cancer screening    No follow-ups on file.  Verneita LITTIE Kettering, MD

## 2023-12-14 NOTE — Assessment & Plan Note (Signed)
 Trouble staying asleep, despite abstaining from alcohol.   trazodone  trial recommended

## 2023-12-14 NOTE — Patient Instructions (Addendum)
 Welcome back!  Even though you have turned your life around and gotten healthy , your bp is STILL HIGH.   YOU SHOULD BE < 130/80   Start taking amlodipine  and aldactone  first   TAKE BOTH OF THESE IN THE MORNING FOR 5 DAYS  IF BP IS STILL >  130/80,  ADD LOSARTAN    RETURN FOR RN VISIT AND BP CHECK IN ONE MONTH   ADDING TRAZODONE  TO HELP YOU SLEEP  START WITH 50 MG AT BEDTIME.  YOU MAY INCREASE TO 100 MG IF NEEDED   OK TO USE TYLENOL  UP TO 2000  MG  IN DIVIDED DOSES (1000 MG EVERY 12)   You are OVERDUE for your tetanus-diptheria-pertussis vaccine   (TDaP)   Please get this done at your pharmacy ;  it will be PAID FOR BY MEDICARE ONLY AT [facility]

## 2023-12-14 NOTE — Assessment & Plan Note (Signed)
 Secondary to alcohol and tylenol  use. HE HAS REDUCED alcohol to 2 drinks per month since last November. REPEAT LFTS ARE DUE .   Lab Results  Component Value Date   ALT 41 08/26/2022   AST 51 (H) 08/26/2022   ALKPHOS 76 08/26/2022   BILITOT 1.1 08/26/2022

## 2023-12-15 ENCOUNTER — Other Ambulatory Visit: Payer: Self-pay | Admitting: Internal Medicine

## 2023-12-15 ENCOUNTER — Encounter: Payer: Self-pay | Admitting: Internal Medicine

## 2023-12-15 ENCOUNTER — Ambulatory Visit: Payer: Self-pay | Admitting: Internal Medicine

## 2023-12-15 ENCOUNTER — Ambulatory Visit: Payer: Self-pay

## 2023-12-15 ENCOUNTER — Other Ambulatory Visit: Payer: Self-pay

## 2023-12-15 DIAGNOSIS — I10 Essential (primary) hypertension: Secondary | ICD-10-CM

## 2023-12-15 DIAGNOSIS — G950 Syringomyelia and syringobulbia: Secondary | ICD-10-CM | POA: Insufficient documentation

## 2023-12-15 LAB — LIPID PANEL
Cholesterol: 104 mg/dL (ref 0–200)
HDL: 19.8 mg/dL — ABNORMAL LOW (ref >39.00)
LDL Cholesterol: 75 mg/dL (ref 0–99)
NonHDL: 84.56
Total CHOL/HDL Ratio: 5
Triglycerides: 47 mg/dL (ref 0.0–149.0)
VLDL: 9.4 mg/dL (ref 0.0–40.0)

## 2023-12-15 LAB — CBC WITH DIFFERENTIAL/PLATELET
Basophils Absolute: 0 K/uL (ref 0.0–0.1)
Basophils Relative: 0.3 % (ref 0.0–3.0)
Eosinophils Absolute: 0.1 K/uL (ref 0.0–0.7)
Eosinophils Relative: 1.4 % (ref 0.0–5.0)
HCT: 47 % (ref 39.0–52.0)
Hemoglobin: 15.7 g/dL (ref 13.0–17.0)
Lymphocytes Relative: 29.4 % (ref 12.0–46.0)
Lymphs Abs: 1.7 K/uL (ref 0.7–4.0)
MCHC: 33.4 g/dL (ref 30.0–36.0)
MCV: 88.9 fl (ref 78.0–100.0)
Monocytes Absolute: 0.8 K/uL (ref 0.1–1.0)
Monocytes Relative: 14.7 % — ABNORMAL HIGH (ref 3.0–12.0)
Neutro Abs: 3.1 K/uL (ref 1.4–7.7)
Neutrophils Relative %: 54.2 % (ref 43.0–77.0)
Platelets: 256 K/uL (ref 150.0–400.0)
RBC: 5.29 Mil/uL (ref 4.22–5.81)
RDW: 15.1 % (ref 11.5–15.5)
WBC: 5.7 K/uL (ref 4.0–10.5)

## 2023-12-15 LAB — COMPREHENSIVE METABOLIC PANEL WITH GFR
ALT: 60 U/L — ABNORMAL HIGH (ref 0–53)
AST: 55 U/L — ABNORMAL HIGH (ref 0–37)
Albumin: 4.1 g/dL (ref 3.5–5.2)
Alkaline Phosphatase: 77 U/L (ref 39–117)
BUN: 12 mg/dL (ref 6–23)
CO2: 28 meq/L (ref 19–32)
Calcium: 8.8 mg/dL (ref 8.4–10.5)
Chloride: 104 meq/L (ref 96–112)
Creatinine, Ser: 1.22 mg/dL (ref 0.40–1.50)
GFR: 62.38 mL/min (ref >60.00)
Glucose, Bld: 95 mg/dL (ref 70–99)
Potassium: 3.9 meq/L (ref 3.5–5.1)
Sodium: 142 meq/L (ref 135–145)
Total Bilirubin: 0.5 mg/dL (ref 0.2–1.2)
Total Protein: 6.4 g/dL (ref 6.0–8.3)

## 2023-12-15 LAB — LDL CHOLESTEROL, DIRECT: Direct LDL: 81 mg/dL

## 2023-12-15 LAB — HEMOGLOBIN A1C: Hgb A1c MFr Bld: 5.1 % (ref 4.6–6.5)

## 2023-12-15 LAB — PSA, MEDICARE: PSA: 1.23 ng/mL (ref 0.10–4.00)

## 2023-12-15 LAB — MAGNESIUM: Magnesium: 2.2 mg/dL (ref 1.5–2.5)

## 2023-12-15 LAB — TSH: TSH: 1.2 u[IU]/mL (ref 0.35–5.50)

## 2023-12-15 MED ORDER — SPIRONOLACTONE 50 MG PO TABS: 50.0000 mg | ORAL_TABLET | Freq: Every day | ORAL | 1 refills | Status: DC

## 2023-12-15 NOTE — Telephone Encounter (Signed)
 Patient was seen yesterday and given prescription for Crestor . Patient states there wasn't any discussion with starting Crestor -patient is asking for clarification. Patient's visit summary does say to start Aldactone  but patient is out of this medication. Does need a refill. Patient is asking for a follow up call with clarification.  FYI Only or Action Required?: Action required by provider: clinical question for provider.  Patient was last seen in primary care on 12/14/2023 by Marylynn Verneita CROME, MD.  Called Nurse Triage reporting Medication Problem.   Triage Disposition: Call PCP When Office is Open  Patient/caregiver understands and will follow disposition?: Yes  Copied from CRM #[id number]. Topic: Clinical - Medication Question >> Dec 15, 2023  3:55 PM Suzen RAMAN wrote: Reason for CRM: rosuvastatin  (CRESTOR ) 10 MG tablet patient would like a call back pertaining to medication rosuvastatin  (CRESTOR ) 10 MG tablet and how he is suppose to proceed forward with taking. All other medication sent in yesterday are notate on office note with details on how to take but not rosuvastatin .    CB#[phone] Reason for Disposition  [1] Caller has NON-URGENT medicine question about med that PCP prescribed AND [2] triager unable to answer question  Answer Assessment - Initial Assessment Questions 1. NAME of MEDICINE: What medicine(s) are you calling about?     Aldactone  and Crestor  2. QUESTION: What is your question? (e.g., double dose of medicine, side effect)     Patient is currently out of Aldactone  and didn't receive a refill. Patient's instructed state to start taking Aldactone . Patient is needing a refill. Patient states with the Crestor -he wasn't given any information on starting the medication. Patient is asking for clarification.  3. PRESCRIBER: Who prescribed the medicine? Reason: if prescribed by specialist, call should be referred to that group.     Tullo MD 4. SYMPTOMS: Do you have  any symptoms? If Yes, ask: What symptoms are you having?  How bad are the symptoms (e.g., mild, moderate, severe)     No symptoms  Protocols used: Medication Question Call-A-AH

## 2023-12-15 NOTE — Assessment & Plan Note (Signed)
 5 yr follow up due in 2028

## 2023-12-15 NOTE — Assessment & Plan Note (Addendum)
 Incidental finding on CT done durng hospitalization for diverticulitis.  He is a lifelong nonsmoker. And is asymptomatic .   Alpha 1 antitrypsin level was normal in 2023

## 2023-12-15 NOTE — Assessment & Plan Note (Signed)
 Will repeat enzymes in one month and obtain U's if still elevated   Lab Results  Component Value Date   ALT 60 (H) 12/14/2023   AST 55 (H) 12/14/2023   ALKPHOS 77 12/14/2023   BILITOT 0.5 12/14/2023

## 2023-12-15 NOTE — Assessment & Plan Note (Signed)
 Advised to resume rosuvastatin  when current simvastatin  supply has been depleted

## 2023-12-15 NOTE — Addendum Note (Signed)
 Addended by: Derry Kassel on: 12/15/2023 04:30 PM   Modules accepted: Orders

## 2023-12-15 NOTE — Assessment & Plan Note (Signed)
 Secondary to alcohol abuse.  Last U/S June 2023. Will repeat at next visit if LFTs remain elevated

## 2023-12-15 NOTE — Assessment & Plan Note (Signed)

## 2023-12-15 NOTE — Telephone Encounter (Signed)
 Spoke with pt to let himknow that we sent in the Spironolactone . Pt was wanting to know if he was supposed to start the Crestor . He stated that this medication was not mentioned in his visit. He stated that he had been taking the Simvastatin .

## 2023-12-15 NOTE — Assessment & Plan Note (Addendum)
 Simvastatin  was changed to rosuvastatin  n march 2023 to address aortic atherosclerosis,  but he has been taking simvastatin  10 mg daily (1/2 tablet )  .  LFts are elevated , likely due to alcoholic hepatitis,  And will need repeating   Lab Results  Component Value Date   CHOL 104 12/14/2023   HDL 19.80 (L) 12/14/2023   LDLCALC 75 12/14/2023   LDLDIRECT 81.0 12/14/2023   TRIG 47.0 12/14/2023   CHOLHDL 5 12/14/2023   Lab Results  Component Value Date   ALT 60 (H) 12/14/2023   AST 55 (H) 12/14/2023   ALKPHOS 77 12/14/2023   BILITOT 0.5 12/14/2023

## 2023-12-15 NOTE — Telephone Encounter (Signed)
 Copied from CRM [id number]. Topic: Clinical - Medication Refill >> Dec 15, 2023  3:53 PM Suzen RAMAN wrote: Medication: spironolactone  (ALDACTONE ) 50 MG tablet  Has the patient contacted their pharmacy? Yes   This is the patient's preferred pharmacy:   [id number] - [place], [place] - [address] AT [facility] OF [address] [place] [address] [place] [address] Phone: [phone] Fax: [phone]  Is this the correct pharmacy for this prescription? Yes If no, delete pharmacy and type the correct one.   Has the prescription been filled recently? No  Is the patient out of the medication? Yes  Has the patient been seen for an appointment in the last year OR does the patient have an upcoming appointment? Yes  Can we respond through MyChart? Yes  Agent: Please be advised that Rx refills may take up to 3 business days. We ask that you follow-up with your pharmacy.

## 2023-12-16 ENCOUNTER — Other Ambulatory Visit: Payer: Self-pay | Admitting: Internal Medicine

## 2023-12-16 ENCOUNTER — Telehealth: Payer: Self-pay

## 2023-12-16 DIAGNOSIS — I10 Essential (primary) hypertension: Secondary | ICD-10-CM

## 2023-12-16 NOTE — Telephone Encounter (Signed)
 Copied from CRM [id number]. Topic: General - Call Back - No Documentation >> Dec 16, 2023 12:26 PM Rea ORN wrote: Reason for CRM: Pt missed call from CMA. I advise pt the following messages from PCP:  The rosuvastatin  is not new .  He was switched to it in March 2023,  his last appt before yesterday,  the losartan  was pending review of his labs.  Message #2.  He can finish the current bottle of simvastatin  but should switch to rosuvastatin  when he is out. . The aldactone  will be sent.   If there was more that needed to be advised, please call pt back.

## 2023-12-16 NOTE — Telephone Encounter (Signed)
 LMTCB

## 2023-12-16 NOTE — Telephone Encounter (Signed)
Please see additional telephone encounter.

## 2023-12-16 NOTE — Addendum Note (Signed)
 Addended by: Cherylin Waguespack on: 12/16/2023 02:54 PM   Modules accepted: Orders

## 2023-12-16 NOTE — Telephone Encounter (Signed)
 Pt is aware and gave a verbal understanding.

## 2023-12-16 NOTE — Telephone Encounter (Signed)
 This RN initiated separate refill request with appropriate pharmacy attached.   Copied from CRM #[id number]. Topic: Clinical - Prescription Issue >> Dec 16, 2023 12:46 PM Franky GRADE wrote: Reason for CRM: Patient's prescription was sent to the incorrect pharmacy.   spironolactone  (ALDACTONE ) 50 MG tablet [[id number]] needs to be sent to  [id number] - [place], [place] - [address] AT [facility] OF [address] [facility] [phone] [address] [place] [address]

## 2023-12-19 ENCOUNTER — Other Ambulatory Visit: Payer: Self-pay

## 2023-12-19 DIAGNOSIS — I10 Essential (primary) hypertension: Secondary | ICD-10-CM

## 2023-12-19 MED ORDER — SPIRONOLACTONE 50 MG PO TABS: 50.0000 mg | ORAL_TABLET | Freq: Every day | ORAL | 1 refills | Status: DC

## 2023-12-19 MED ORDER — SPIRONOLACTONE 50 MG PO TABS: 50.0000 mg | ORAL_TABLET | Freq: Every day | ORAL | 1 refills | Status: AC

## 2023-12-19 NOTE — Telephone Encounter (Signed)
 Called pt he stated he hasn't pick this medication up but plans on getting it today. This medication is possibly a duplicate

## 2023-12-19 NOTE — Addendum Note (Signed)
 Addended by: HARRIETTE RAISIN on: 12/19/2023 01:57 PM   Modules accepted: Orders

## 2023-12-19 NOTE — Telephone Encounter (Signed)
Medication has been sent to correct pharmacy. 

## 2023-12-29 ENCOUNTER — Telehealth: Payer: Self-pay

## 2023-12-29 NOTE — Telephone Encounter (Signed)
 Copied from CRM #[id number]. Topic: General - Other >> Dec 29, 2023  9:42 AM Ahlexyia S wrote: Reason for CRM: Nurse practitioner Rumaldo with [facility] called in to inform clinic that she saw pt today and he had some expiratory wheezes. Pt mentioned that he does have a cough in the morning and that has been a ongoing thing. Rumaldo is questioning if pt is needing to have further lung testing. Rumaldo also mentioned that pt is having a murmur when listening to the pt heart. Pt isnt having any additional symptoms at this time. Carley agreed to be contacted if there are any additional questions.  Carley; [facility] [phone]

## 2023-12-29 NOTE — Telephone Encounter (Signed)
 Spoke with pt and he has been scheduled for next week.

## 2024-01-04 ENCOUNTER — Encounter: Payer: Self-pay | Admitting: Internal Medicine

## 2024-01-04 ENCOUNTER — Ambulatory Visit: Admitting: Internal Medicine

## 2024-01-04 VITALS — BP 162/98 | HR 78 | Ht 71.0 in | Wt 202.6 lb

## 2024-01-04 DIAGNOSIS — R011 Cardiac murmur, unspecified: Secondary | ICD-10-CM | POA: Diagnosis not present

## 2024-01-04 DIAGNOSIS — Z87898 Personal history of other specified conditions: Secondary | ICD-10-CM | POA: Diagnosis not present

## 2024-01-04 MED ORDER — TETANUS-DIPHTH-ACELL PERTUSSIS 5-2-15.5 LF-MCG/0.5 IM SUSP: 0.5000 mL | Freq: Once | INTRAMUSCULAR | 0 refills | Status: AC

## 2024-01-04 NOTE — Patient Instructions (Addendum)
 Continue amlodipine  and start back on losartan  NOW .  HOLD OFF ON STARTING ALDACTONE    After one week of TAKING AMLODIPINE  AND LOSARTAN , MEASURE YOUR BP ONCE DAILY FOR 3  DAYS  and send me  the readings

## 2024-01-04 NOTE — Progress Notes (Unsigned)
 Subjective:  Patient ID: Jonathan Simon, male    DOB: [birth year]  Age: 63 y.o. MRN: [id number]  CC: There were no encounter diagnoses.   HPI Jonathan Simon presents for  Chief Complaint  Patient presents with   Medical Management of Chronic Issues    Follow up from NP home visit   1) wheezing noted by RN who made a home visit  2) HTN:  taking amlodipine   since oct 15 visit.   Never started spironolactone     Outpatient Medications Prior to Visit  Medication Sig Dispense Refill   albuterol  (PROVENTIL  HFA;VENTOLIN  HFA) 108 (90 BASE) MCG/ACT inhaler Inhale 2 puffs into the lungs every 6 (six) hours as needed for wheezing. 1 Inhaler 0   amLODipine  (NORVASC ) 10 MG tablet Take 1 tablet (10 mg total) by mouth daily. 90 tablet 1   losartan  (COZAAR ) 100 MG tablet Take 1 tablet (100 mg total) by mouth daily. 90 tablet 1   rosuvastatin  (CRESTOR ) 10 MG tablet Take 1 tablet (10 mg total) by mouth daily. 90 tablet 1   spironolactone  (ALDACTONE ) 50 MG tablet Take 1 tablet (50 mg total) by mouth daily. 90 tablet 1   traZODone  (DESYREL ) 50 MG tablet Take 1 tablet (50 mg total) by mouth at bedtime. 90 tablet 1   No facility-administered medications prior to visit.    Review of Systems;  Patient denies headache, fevers, malaise, unintentional weight loss, skin rash, eye pain, sinus congestion and sinus pain, sore throat, dysphagia,  hemoptysis , cough, dyspnea, wheezing, chest pain, palpitations, orthopnea, edema, abdominal pain, nausea, melena, diarrhea, constipation, flank pain, dysuria, hematuria, urinary  Frequency, nocturia, numbness, tingling, seizures,  Focal weakness, Loss of consciousness,  Tremor, insomnia, depression, anxiety, and suicidal ideation.      Objective:  BP (!) 162/98   Pulse 78   Ht 5' 11 (1.803 m)   Wt 202 lb 9.6 oz (91.9 kg)   SpO2 97%   BMI 28.26 kg/m   BP Readings from Last 3 Encounters:  01/04/24 (!) 162/98  12/14/23 (!) 170/96  08/27/22 (!) 151/101     Wt Readings from Last 3 Encounters:  01/04/24 202 lb 9.6 oz (91.9 kg)  12/14/23 208 lb (94.3 kg)  08/26/22 210 lb (95.3 kg)    Physical Exam  Lab Results  Component Value Date   HGBA1C 5.1 12/14/2023   HGBA1C 5.1 05/25/2021   HGBA1C 5.2 04/22/2016    Lab Results  Component Value Date   CREATININE 1.22 12/14/2023   CREATININE 0.76 08/26/2022   CREATININE 0.85 08/19/2021    Lab Results  Component Value Date   WBC 5.7 12/14/2023   HGB 15.7 12/14/2023   HCT 47.0 12/14/2023   PLT 256.0 12/14/2023   GLUCOSE 95 12/14/2023   CHOL 104 12/14/2023   TRIG 47.0 12/14/2023   HDL 19.80 (L) 12/14/2023   LDLDIRECT 81.0 12/14/2023   LDLCALC 75 12/14/2023   ALT 60 (H) 12/14/2023   AST 55 (H) 12/14/2023   NA 142 12/14/2023   K 3.9 12/14/2023   CL 104 12/14/2023   CREATININE 1.22 12/14/2023   BUN 12 12/14/2023   CO2 28 12/14/2023   TSH 1.20 12/14/2023   PSA 1.23 12/14/2023   INR 1.0 05/01/2021   HGBA1C 5.1 12/14/2023    DG Chest 2 View Result Date: 08/26/2022 CLINICAL DATA:  Chest pain, neck pain EXAM: CHEST - 2 VIEW COMPARISON:  08/14/2021 FINDINGS: Cardiac size is within normal limits. There are no signs of pulmonary  edema or focal pulmonary consolidation. Surgical staples are seen in medial right lung. Linear densities are seen in right upper and right lower lung fields with no significant change. Possible pleural calcification is seen in the right lower lung field. Flattening of diaphragms may suggest COPD. Blunting of both lateral CP angles has not changed. There is no pneumothorax. IMPRESSION: There are no new infiltrates or signs of pulmonary edema. Electronically Signed   By: Gearldine Mary M.D.   On: 08/26/2022 17:52    Assessment & Plan:  .There are no diagnoses linked to this encounter.   I spent 34 minutes on the day of this face to face encounter reviewing patient's  most recent visit with cardiology,  nephrology,  and neurology,  prior relevant surgical and  non surgical procedures, recent  labs and imaging studies, counseling on weight management,  reviewing the assessment and plan with patient, and post visit ordering and reviewing of  diagnostics and therapeutics with patient  .   Follow-up: No follow-ups on file.   Verneita LITTIE Kettering, MD

## 2024-01-05 DIAGNOSIS — R011 Cardiac murmur, unspecified: Secondary | ICD-10-CM | POA: Insufficient documentation

## 2024-01-05 DIAGNOSIS — Z87898 Personal history of other specified conditions: Secondary | ICD-10-CM | POA: Insufficient documentation

## 2024-01-05 NOTE — Assessment & Plan Note (Signed)
 Noted on exam by [employer] RN making an unsolicited house call.   Heart exam is normal and he exercises vigorously without side effects

## 2024-01-05 NOTE — Assessment & Plan Note (Signed)
 Reported by [employer] during an [employer] visit. He has no evidence of wheezing on exam or with exercise,  no  workup indicated

## 2024-01-16 ENCOUNTER — Ambulatory Visit

## 2024-02-09 ENCOUNTER — Telehealth: Payer: Self-pay

## 2024-02-13 NOTE — Telephone Encounter (Signed)
 Error

## 2024-02-17 ENCOUNTER — Emergency Department

## 2024-02-17 ENCOUNTER — Encounter (HOSPITAL_COMMUNITY): Payer: Self-pay

## 2024-02-17 ENCOUNTER — Other Ambulatory Visit: Payer: Self-pay

## 2024-02-17 ENCOUNTER — Emergency Department
Admission: EM | Admit: 2024-02-17 | Discharge: 2024-02-18 | Disposition: A | Discharge status: Other Acute Inpatient Hospital | Attending: Emergency Medicine | Admitting: Emergency Medicine

## 2024-02-17 DIAGNOSIS — I10 Essential (primary) hypertension: Secondary | ICD-10-CM | POA: Diagnosis not present

## 2024-02-17 DIAGNOSIS — K859 Acute pancreatitis without necrosis or infection, unspecified: Secondary | ICD-10-CM | POA: Insufficient documentation

## 2024-02-17 DIAGNOSIS — K5730 Diverticulosis of large intestine without perforation or abscess without bleeding: Secondary | ICD-10-CM | POA: Diagnosis not present

## 2024-02-17 DIAGNOSIS — K8042 Calculus of bile duct with acute cholecystitis without obstruction: Secondary | ICD-10-CM | POA: Diagnosis not present

## 2024-02-17 DIAGNOSIS — I700 Atherosclerosis of aorta: Secondary | ICD-10-CM | POA: Insufficient documentation

## 2024-02-17 DIAGNOSIS — R1031 Right lower quadrant pain: Secondary | ICD-10-CM | POA: Diagnosis present

## 2024-02-17 DIAGNOSIS — R0602 Shortness of breath: Secondary | ICD-10-CM | POA: Diagnosis present

## 2024-02-17 DIAGNOSIS — K7010 Alcoholic hepatitis without ascites: Secondary | ICD-10-CM | POA: Insufficient documentation

## 2024-02-17 DIAGNOSIS — R918 Other nonspecific abnormal finding of lung field: Secondary | ICD-10-CM | POA: Insufficient documentation

## 2024-02-17 LAB — HEPATIC FUNCTION PANEL
ALT: 264 U/L — ABNORMAL HIGH (ref 0–44)
AST: 175 U/L — ABNORMAL HIGH (ref 15–41)
Albumin: 4.5 g/dL (ref 3.5–5.0)
Alkaline Phosphatase: 138 U/L — ABNORMAL HIGH (ref 38–126)
Bilirubin, Direct: 1.6 mg/dL — ABNORMAL HIGH (ref 0.0–0.2)
Indirect Bilirubin: 0.9 mg/dL (ref 0.3–0.9)
Total Bilirubin: 2.6 mg/dL — ABNORMAL HIGH (ref 0.0–1.2)
Total Protein: 7.2 g/dL (ref 6.5–8.1)

## 2024-02-17 LAB — CBC
HCT: 49.7 % (ref 39.0–52.0)
Hemoglobin: 16.2 g/dL (ref 13.0–17.0)
MCH: 28.8 pg (ref 26.0–34.0)
MCHC: 32.6 g/dL (ref 30.0–36.0)
MCV: 88.3 fL (ref 80.0–100.0)
Platelets: 369 K/uL (ref 150–400)
RBC: 5.63 MIL/uL (ref 4.22–5.81)
RDW: 15.4 % (ref 11.5–15.5)
WBC: 11.9 K/uL — ABNORMAL HIGH (ref 4.0–10.5)
nRBC: 0 % (ref 0.0–0.2)

## 2024-02-17 LAB — BASIC METABOLIC PANEL WITH GFR
Anion gap: 13 (ref 5–15)
BUN: 13 mg/dL (ref 8–23)
CO2: 26 mmol/L (ref 22–32)
Calcium: 9.4 mg/dL (ref 8.9–10.3)
Chloride: 101 mmol/L (ref 98–111)
Creatinine, Ser: 1.12 mg/dL (ref 0.61–1.24)
GFR, Estimated: >60 mL/min
Glucose, Bld: 110 mg/dL — ABNORMAL HIGH (ref 70–99)
Potassium: 3.3 mmol/L — ABNORMAL LOW (ref 3.5–5.1)
Sodium: 140 mmol/L (ref 135–145)

## 2024-02-17 LAB — TROPONIN T, HIGH SENSITIVITY
Troponin T High Sensitivity: <15 ng/L (ref 0–19)
Troponin T High Sensitivity: <15 ng/L (ref 0–19)

## 2024-02-17 LAB — PRO BRAIN NATRIURETIC PEPTIDE: Pro Brain Natriuretic Peptide: 70.9 pg/mL

## 2024-02-17 LAB — LIPASE, BLOOD: Lipase: >2800 U/L — ABNORMAL HIGH (ref 11–51)

## 2024-02-17 LAB — TRIGLYCERIDES: Triglycerides: 44 mg/dL

## 2024-02-17 MED ORDER — HYDROMORPHONE HCL 1 MG/ML IJ SOLN
1.0000 mg | INTRAMUSCULAR | Status: DC | PRN
  Administered 2024-02-18 (×4): 1 mg via INTRAVENOUS
  Filled 2024-02-17 (×4): qty 1

## 2024-02-17 MED ORDER — PIPERACILLIN-TAZOBACTAM 3.375 G IVPB
3.3750 g | Freq: Three times a day (TID) | INTRAVENOUS | Status: DC
  Administered 2024-02-17 – 2024-02-18 (×3): 3.375 g via INTRAVENOUS
  Filled 2024-02-17 (×3): qty 50

## 2024-02-17 MED ORDER — SODIUM CHLORIDE 0.9 % IV SOLN: INTRAVENOUS | Status: DC

## 2024-02-17 MED ORDER — HYDROMORPHONE HCL 1 MG/ML IJ SOLN
1.0000 mg | Freq: Once | INTRAMUSCULAR | Status: AC
  Administered 2024-02-17: 1 mg via INTRAVENOUS
  Filled 2024-02-17: qty 1

## 2024-02-17 MED ORDER — ONDANSETRON HCL 4 MG/2ML IJ SOLN
4.0000 mg | Freq: Once | INTRAMUSCULAR | Status: AC
  Administered 2024-02-17: 4 mg via INTRAVENOUS
  Filled 2024-02-17: qty 2

## 2024-02-17 MED ORDER — POTASSIUM CHLORIDE 10 MEQ/100ML IV SOLN
10.0000 meq | INTRAVENOUS | Status: AC
  Administered 2024-02-17 (×2): 10 meq via INTRAVENOUS
  Filled 2024-02-17 (×2): qty 100

## 2024-02-17 MED ORDER — IOHEXOL 300 MG/ML  SOLN
100.0000 mL | Freq: Once | INTRAMUSCULAR | Status: AC | PRN
  Administered 2024-02-17: 100 mL via INTRAVENOUS

## 2024-02-17 MED ORDER — SODIUM CHLORIDE 0.9 % IV BOLUS
1000.0000 mL | Freq: Once | INTRAVENOUS | Status: AC
  Administered 2024-02-18: 1000 mL via INTRAVENOUS

## 2024-02-17 MED ORDER — PIPERACILLIN-TAZOBACTAM 3.375 G IVPB 30 MIN: 3.3750 g | Freq: Once | INTRAVENOUS | Status: DC

## 2024-02-17 NOTE — ED Provider Notes (Signed)
 Medical screening examination/treatment/procedure(s) were conducted as a shared visit with non-physician practitioner(s) and myself.  I personally evaluated the patient during the encounter.  Given the constellation of findings patient has concerning symptoms possibly indicative of acute choledocholithiasis with associated pancreatitis, possible signs of cholecystitis etc.  General surgery was consulted and also GI, they have recommended the patient be transferred for center that has ERCP available.  We do not have any ERCP availability this weekend.  Discussed with the patient and his family.  They would strongly prefer him to go to [place], otherwise [facility] or [facility].  He is previously been seen at [facility] and had good experience.  Patient awake alert pain starting to return will give additional hydromorphone .  Receiving IV fluid maintenance, ice chips, Zosyn , pain control.  Concern that the patient may be early or at high risk for developing cholangitis as well.  Will cover him broadly with antibiotics at this time thankfully is not febrile and he is nontoxic at this point.  He is currently stable for transfer to a center with higher capabilities including ERCP  I discussed his case with and provided intake information but he is not yet excepted.  Discussed with the intake center at [facility] via their coordinator [place].  Awaiting callback from physician, Devere Perry to continue to follow the patient   Dicky Anes, MD 02/17/24 2009

## 2024-02-17 NOTE — Progress Notes (Signed)
 Hospitalist Transfer Note:    Nursing staff, Please call [facility] Admits & Consults System-Wide number on Amion [phone]) as soon as patient's arrival, so appropriate admitting provider can evaluate the pt.   Transferring facility: [facility] Requesting provider: Devere Perry, PA (EDP at [facility]) Reason for transfer: admission for further evaluation and management of acute cholecystitis with concern for choledocholithiasis and potential acute biliary pancreatitis.     38 M w/ history essential hypertension, who presented to [facility] ED complaining of 1 to 2 days of right upper quadrant abdominal discomfort associated with nausea/vomiting, noting that the intensity of his right upper quadrant discomfort intensified on the drive over to [facility] ED today.    Vital signs in the ED were notable for the following: Afebrile; heart rates in the 60s to 80s; systolic blood pressures in the low 100s to 140s; respiratory rate 18-20, oxygen saturation 94 to 100% on room air.  Labs were notable for CMP, which showed potassium 3.3 alkaline phosphatase 138, total bilirubin 2.6, direct bilirubin 1.6, AST 175, ALT 264.  Lipase greater than 2800 troponin x 2 is found to be less than 15.  CBC showed white blood cell count of 11,900  Imaging notable for CT abdomen/pelvis with contrast, which was reported to show findings suggestive of acute cholecystitis as well as findings suggestive of choledocholithiasis in addition to evidence of acute pancreatitis. right upper quadrant ultrasound was also reported to show evidence consistent with acute cholecystitis and suggestive of choledocholithiasis.   EDP discussed with on-call general surgery at [facility] who confirmed that ERCP would be necessary prior to general surgery intervention. EDP then discussed with on-call GI at [facility], who conveyed that ERCP services would not be available until Monday, 02/20/24 and recommended transfer to the [place] for expedited ERCP.   EDP at [facility]  subsequently d/w on-call GI at [facility], Dr. Rollin, who recommended hospitalist admission to [facility]. Dr. Rollin conveyed that he will formally consult and is planning for ERCP tomorrow (12/20).   Medications administered prior to transfer included the following: Zosyn , potassium chloride  20 meq IV, Zofran  4 mg IV x 1 dose, Dilaudid  1 mg IV x 3 doses, continuous normal saline running at 150 cc/h.  Subsequently, I accepted this patient for transfer for inpatient admission to a med-tele bed at [facility] for further work-up and management of the above.   Reason/necessity for transfer on the basis of need for higher level of care and availability of specialist providers, namely timing of availability of ERCP capabilities.      Eva Pore, DO Hospitalist

## 2024-02-17 NOTE — ED Provider Notes (Signed)
----------------------------------------- °  11:15 PM on 02/17/2024 -----------------------------------------  I personally seen and evaluated the patient.  Patient has been accepted to [facility].  I have updated the patient he is agreeable to this plan.  Pain is controlled currently.   Dorothyann Drivers, MD 02/17/24 2317

## 2024-02-17 NOTE — ED Provider Notes (Signed)
 "  [facility] Provider Note    Event Date/Time   First MD Initiated Contact with Patient 02/17/24 1507     (approximate)   History   No chief complaint on file.   HPI  Jonathan Simon is a 63 y.o. male history of hypertension, alcoholic hepatitis, presents emergency department with complaints of right lower chest and abdominal pain.  Patient states pain is severe.  He has never had pain like this before.  States he was driving here to the hospital when all of a sudden his abdomen got worse.  Had to pull over and was brought in by EMS.  EMS gave him 324 mg of ASA and route to the hospital.  He has some nausea, vomiting and shortness of breath.  Denies fever chills.      Physical Exam   Triage Vital Signs: ED Triage Vitals  Encounter Vitals Group     BP 02/17/24 1156 108/69     Girls Systolic BP Percentile --      Girls Diastolic BP Percentile --      Boys Systolic BP Percentile --      Boys Diastolic BP Percentile --      Pulse Rate 02/17/24 1155 83     Resp 02/17/24 1155 20     Temp 02/17/24 1155 98 F (36.7 C)     Temp Source 02/17/24 1155 Oral     SpO2 02/17/24 1155 98 %     Weight 02/17/24 1156 210 lb (95.3 kg)     Height 02/17/24 1156 5' 11 (1.803 m)     Head Circumference --      Peak Flow --      Pain Score 02/17/24 1156 9     Pain Loc --      Pain Education --      Exclude from Growth Chart --     Most recent vital signs: Vitals:   02/17/24 1957 02/17/24 2231  BP: (!) 144/85 135/84  Pulse: 62 (!) 57  Resp: 19 18  Temp: 98.9 F (37.2 C) 98.2 F (36.8 C)  SpO2: 94% 97%     General: Awake, no distress.   CV:  Good peripheral perfusion.  Resp:  Normal effort.  Abd:  No distention.  Abdomen hard, tender, patient has excruciating pain when touched in all 4 quads, indicating peritoneal sign Other:      ED Results / Procedures / Treatments   Labs (all labs ordered are listed, but only abnormal results are displayed) Labs  Reviewed  BASIC METABOLIC PANEL WITH GFR - Abnormal; Notable for the following components:      Result Value   Potassium 3.3 (*)    Glucose, Bld 110 (*)    All other components within normal limits  CBC - Abnormal; Notable for the following components:   WBC 11.9 (*)    All other components within normal limits  HEPATIC FUNCTION PANEL - Abnormal; Notable for the following components:   AST 175 (*)    ALT 264 (*)    Alkaline Phosphatase 138 (*)    Total Bilirubin 2.6 (*)    Bilirubin, Direct 1.6 (*)    All other components within normal limits  LIPASE, BLOOD - Abnormal; Notable for the following components:   Lipase >2,800 (*)    All other components within normal limits  PRO BRAIN NATRIURETIC PEPTIDE  TRIGLYCERIDES  TROPONIN T, HIGH SENSITIVITY  TROPONIN T, HIGH SENSITIVITY     EKG  EKG   RADIOLOGY Chest x-ray, CT abdomen pelvis IV contrast    PROCEDURES:   Procedures  Critical Care:  no No chief complaint on file.     MEDICATIONS ORDERED IN ED: Medications  0.9 %  sodium chloride  infusion ( Intravenous New Bag/Given 02/17/24 2010)  piperacillin -tazobactam (ZOSYN ) IVPB 3.375 g (3.375 g Intravenous New Bag/Given 02/17/24 2011)  HYDROmorphone  (DILAUDID ) injection 1 mg (1 mg Intravenous Given 02/17/24 1605)  ondansetron  (ZOFRAN ) injection 4 mg (4 mg Intravenous Given 02/17/24 1605)  iohexol  (OMNIPAQUE ) 300 MG/ML solution 100 mL (100 mLs Intravenous Contrast Given 02/17/24 1608)  HYDROmorphone  (DILAUDID ) injection 1 mg (1 mg Intravenous Given 02/17/24 1818)  potassium chloride  10 mEq in 100 mL IVPB (0 mEq Intravenous Stopped 02/17/24 2211)  HYDROmorphone  (DILAUDID ) injection 1 mg (1 mg Intravenous Given 02/17/24 2010)  HYDROmorphone  (DILAUDID ) injection 1 mg (1 mg Intravenous Given 02/17/24 2243)     IMPRESSION / MDM / ASSESSMENT AND PLAN / ED COURSE  I reviewed the triage vital signs and the nursing notes.                              Differential  diagnosis includes, but is not limited to, MI, AAA, PE, pancreatitis, acute cholecystitis, choledocholithiasis, bowel obstruction, abscess, acute appendicitis, UTI, pyelonephritis  Patient's presentation is most consistent with acute presentation with potential threat to life or bodily function.   Medications given: Dilaudid  1 mg IV, Zofran  4 mg IV  CT abdomen pelvis IV contrast, chest x-ray   Chest x-ray independently reviewed interpreted by me as having some blunting, questionable concerns for CHF versus atelectasis  Patient's labs with extremely elevated liver enzymes of AST of 175, ALT 264, alk phos at 138, total bili 2.6 and bilirubin at 1.6, lipase also elevated at greater than 2800  CT abdomen pelvis, independent review interpretation by me as showing pancreatitis, concerns for cholelithiasis and inflammation of the gallbladder.  Radiologist recommends ultrasound right upper quadrant.  Ultrasound right upper quadrant ordered  Ultrasound right upper quadrant, independent review and interpretation by me is positive for choledocholithiasis along with acute cholecystitis.  Due to the findings, did consult surgery, they states due to the choledocholithiasis he would need a ERCP prior to them evaluating him.  Consult to Dr. Therisa from GI, states the patient findings, labs, make it concerning that he would need a more emergent ERCP.  Thinks that it should be done in less than 48 hours.  Consult GI at [facility], patient requested [facility], they do not do ERCP on the weekend.  Therefore they are refusing the transfer  Consult to [facility], spoke with Dr. Rollin, he is willing to accept the patient and do ERCP this weekend.  Consult to [facility] hospitalist at [facility], Dr. Cathern will except the patient, patient is awaiting transfer for bed assignment.  Patient is in agreement with this treatment plan.  He has been continuing to have pain, we have been giving Dilaudid  and have given at least 3 doses at this  time.  We did also give him potassium IV, continuous fluids IV, and Zosyn  antibiotics per pharmacy.  Patient is still pending transfer  FINAL CLINICAL IMPRESSION(S) / ED DIAGNOSES   Final diagnoses:  Acute pancreatitis, unspecified complication status, unspecified pancreatitis type  Choledocholithiasis with acute cholecystitis     Rx / DC Orders   ED Discharge Orders     None        Note:  This document was prepared using Dragon voice recognition software and may include unintentional dictation errors.    Gasper Devere ORN, PA-C 02/17/24 [id number]    Dicky Anes, MD 02/27/24 2050  "

## 2024-02-17 NOTE — ED Triage Notes (Signed)
 First nurse note: pt to ED [facility] for chest pain, was on the way to hospital and pulled over to pain. Started this am. Took asa PTA. VSS with ems.

## 2024-02-17 NOTE — ED Triage Notes (Signed)
 Pt c/o epigastric and chest pain that started at 0500. Pt reports pain feels like pressure. Pt denies alleviating or exacerbating factors. Pt endorses n/v and SOB. Pt received 324mg  ASA en route to hospital.

## 2024-02-17 NOTE — ED Notes (Signed)
 Pt complaining of right abdominal pain and is tender to touch on same side.

## 2024-02-18 ENCOUNTER — Other Ambulatory Visit: Payer: Self-pay

## 2024-02-18 ENCOUNTER — Inpatient Hospital Stay (HOSPITAL_COMMUNITY)
Admission: EM | Admit: 2024-02-18 | Discharge: 2024-02-22 | DRG: 444 | Disposition: A | Discharge status: Home / Self Care | Source: Other Acute Inpatient Hospital | Attending: Family Medicine | Admitting: Family Medicine

## 2024-02-18 ENCOUNTER — Encounter (HOSPITAL_COMMUNITY): Payer: Self-pay | Admitting: Internal Medicine

## 2024-02-18 DIAGNOSIS — K838 Other specified diseases of biliary tract: Secondary | ICD-10-CM | POA: Diagnosis not present

## 2024-02-18 DIAGNOSIS — R6889 Other general symptoms and signs: Secondary | ICD-10-CM | POA: Diagnosis not present

## 2024-02-18 DIAGNOSIS — R338 Other retention of urine: Secondary | ICD-10-CM | POA: Diagnosis present

## 2024-02-18 DIAGNOSIS — E66811 Obesity, class 1: Secondary | ICD-10-CM | POA: Diagnosis not present

## 2024-02-18 DIAGNOSIS — N401 Enlarged prostate with lower urinary tract symptoms: Secondary | ICD-10-CM | POA: Diagnosis present

## 2024-02-18 DIAGNOSIS — K8050 Calculus of bile duct without cholangitis or cholecystitis without obstruction: Secondary | ICD-10-CM | POA: Insufficient documentation

## 2024-02-18 DIAGNOSIS — Z8616 Personal history of COVID-19: Secondary | ICD-10-CM

## 2024-02-18 DIAGNOSIS — J439 Emphysema, unspecified: Secondary | ICD-10-CM | POA: Diagnosis present

## 2024-02-18 DIAGNOSIS — Z933 Colostomy status: Secondary | ICD-10-CM | POA: Diagnosis not present

## 2024-02-18 DIAGNOSIS — Z6828 Body mass index (BMI) 28.0-28.9, adult: Secondary | ICD-10-CM

## 2024-02-18 DIAGNOSIS — K859 Acute pancreatitis without necrosis or infection, unspecified: Secondary | ICD-10-CM | POA: Diagnosis present

## 2024-02-18 DIAGNOSIS — T886XXA Anaphylactic reaction due to adverse effect of correct drug or medicament properly administered, initial encounter: Secondary | ICD-10-CM | POA: Diagnosis not present

## 2024-02-18 DIAGNOSIS — F1011 Alcohol abuse, in remission: Secondary | ICD-10-CM | POA: Diagnosis present

## 2024-02-18 DIAGNOSIS — Z8249 Family history of ischemic heart disease and other diseases of the circulatory system: Secondary | ICD-10-CM

## 2024-02-18 DIAGNOSIS — R109 Unspecified abdominal pain: Secondary | ICD-10-CM | POA: Diagnosis not present

## 2024-02-18 DIAGNOSIS — E669 Obesity, unspecified: Secondary | ICD-10-CM | POA: Diagnosis present

## 2024-02-18 DIAGNOSIS — F1021 Alcohol dependence, in remission: Secondary | ICD-10-CM | POA: Diagnosis present

## 2024-02-18 DIAGNOSIS — T782XXA Anaphylactic shock, unspecified, initial encounter: Secondary | ICD-10-CM | POA: Diagnosis not present

## 2024-02-18 DIAGNOSIS — Z91040 Latex allergy status: Secondary | ICD-10-CM | POA: Diagnosis not present

## 2024-02-18 DIAGNOSIS — Z23 Encounter for immunization: Secondary | ICD-10-CM | POA: Diagnosis present

## 2024-02-18 DIAGNOSIS — Z743 Need for continuous supervision: Secondary | ICD-10-CM | POA: Diagnosis not present

## 2024-02-18 DIAGNOSIS — Z818 Family history of other mental and behavioral disorders: Secondary | ICD-10-CM

## 2024-02-18 DIAGNOSIS — I700 Atherosclerosis of aorta: Secondary | ICD-10-CM | POA: Diagnosis present

## 2024-02-18 DIAGNOSIS — T481X5A Adverse effect of skeletal muscle relaxants [neuromuscular blocking agents], initial encounter: Secondary | ICD-10-CM | POA: Diagnosis not present

## 2024-02-18 DIAGNOSIS — K760 Fatty (change of) liver, not elsewhere classified: Secondary | ICD-10-CM | POA: Diagnosis present

## 2024-02-18 DIAGNOSIS — Z825 Family history of asthma and other chronic lower respiratory diseases: Secondary | ICD-10-CM | POA: Diagnosis not present

## 2024-02-18 DIAGNOSIS — Y838 Other surgical procedures as the cause of abnormal reaction of the patient, or of later complication, without mention of misadventure at the time of the procedure: Secondary | ICD-10-CM | POA: Diagnosis not present

## 2024-02-18 DIAGNOSIS — E876 Hypokalemia: Secondary | ICD-10-CM | POA: Diagnosis present

## 2024-02-18 DIAGNOSIS — K851 Biliary acute pancreatitis without necrosis or infection: Principal | ICD-10-CM | POA: Diagnosis present

## 2024-02-18 DIAGNOSIS — K709 Alcoholic liver disease, unspecified: Secondary | ICD-10-CM | POA: Diagnosis present

## 2024-02-18 DIAGNOSIS — K8062 Calculus of gallbladder and bile duct with acute cholecystitis without obstruction: Secondary | ICD-10-CM | POA: Diagnosis present

## 2024-02-18 DIAGNOSIS — K810 Acute cholecystitis: Secondary | ICD-10-CM | POA: Insufficient documentation

## 2024-02-18 DIAGNOSIS — I10 Essential (primary) hypertension: Secondary | ICD-10-CM | POA: Diagnosis present

## 2024-02-18 DIAGNOSIS — Z79899 Other long term (current) drug therapy: Secondary | ICD-10-CM | POA: Diagnosis not present

## 2024-02-18 DIAGNOSIS — K219 Gastro-esophageal reflux disease without esophagitis: Secondary | ICD-10-CM | POA: Diagnosis present

## 2024-02-18 DIAGNOSIS — R21 Rash and other nonspecific skin eruption: Secondary | ICD-10-CM | POA: Diagnosis not present

## 2024-02-18 DIAGNOSIS — N400 Enlarged prostate without lower urinary tract symptoms: Secondary | ICD-10-CM | POA: Diagnosis present

## 2024-02-18 DIAGNOSIS — E785 Hyperlipidemia, unspecified: Secondary | ICD-10-CM | POA: Diagnosis present

## 2024-02-18 LAB — COMPREHENSIVE METABOLIC PANEL WITH GFR
ALT: 197 U/L — ABNORMAL HIGH (ref 0–44)
AST: 85 U/L — ABNORMAL HIGH (ref 15–41)
Albumin: 3.8 g/dL (ref 3.5–5.0)
Alkaline Phosphatase: 99 U/L (ref 38–126)
Anion gap: 9 (ref 5–15)
BUN: 11 mg/dL (ref 8–23)
CO2: 29 mmol/L (ref 22–32)
Calcium: 8.9 mg/dL (ref 8.9–10.3)
Chloride: 102 mmol/L (ref 98–111)
Creatinine, Ser: 1.08 mg/dL (ref 0.61–1.24)
GFR, Estimated: >60 mL/min
Glucose, Bld: 74 mg/dL (ref 70–99)
Potassium: 3.7 mmol/L (ref 3.5–5.1)
Sodium: 139 mmol/L (ref 135–145)
Total Bilirubin: 1.8 mg/dL — ABNORMAL HIGH (ref 0.0–1.2)
Total Protein: 6.2 g/dL — ABNORMAL LOW (ref 6.5–8.1)

## 2024-02-18 LAB — TYPE AND SCREEN
ABO/RH(D): O NEG
Antibody Screen: NEGATIVE

## 2024-02-18 LAB — CBC
HCT: 43 % (ref 39.0–52.0)
Hemoglobin: 14.2 g/dL (ref 13.0–17.0)
MCH: 29.5 pg (ref 26.0–34.0)
MCHC: 33 g/dL (ref 30.0–36.0)
MCV: 89.2 fL (ref 80.0–100.0)
Platelets: 250 K/uL (ref 150–400)
RBC: 4.82 MIL/uL (ref 4.22–5.81)
RDW: 15.6 % — ABNORMAL HIGH (ref 11.5–15.5)
WBC: 10.2 K/uL (ref 4.0–10.5)
nRBC: 0 % (ref 0.0–0.2)

## 2024-02-18 LAB — HIV ANTIBODY (ROUTINE TESTING W REFLEX): HIV Screen 4th Generation wRfx: NONREACTIVE

## 2024-02-18 LAB — ABO/RH: ABO/RH(D): O NEG

## 2024-02-18 MED ORDER — HYDROMORPHONE HCL 1 MG/ML IJ SOLN
0.5000 mg | INTRAMUSCULAR | Status: DC | PRN
  Administered 2024-02-18 – 2024-02-19 (×3): 1 mg via INTRAVENOUS
  Administered 2024-02-20: 0.5 mg via INTRAVENOUS
  Filled 2024-02-18 (×4): qty 1

## 2024-02-18 MED ORDER — SODIUM CHLORIDE 0.9 % IV SOLN: INTRAVENOUS | Status: AC

## 2024-02-18 MED ORDER — HYDROMORPHONE HCL 1 MG/ML IJ SOLN
1.0000 mg | Freq: Once | INTRAMUSCULAR | Status: AC
  Administered 2024-02-18: 1 mg via INTRAVENOUS
  Filled 2024-02-18: qty 1

## 2024-02-18 MED ORDER — ACETAMINOPHEN 325 MG PO TABS: 650.0000 mg | ORAL_TABLET | Freq: Four times a day (QID) | ORAL | Status: DC | PRN

## 2024-02-18 MED ORDER — ONDANSETRON HCL 4 MG/2ML IJ SOLN: 4.0000 mg | Freq: Three times a day (TID) | INTRAMUSCULAR | Status: DC | PRN

## 2024-02-18 MED ORDER — ALBUTEROL SULFATE (2.5 MG/3ML) 0.083% IN NEBU: 3.0000 mL | INHALATION_SOLUTION | Freq: Four times a day (QID) | RESPIRATORY_TRACT | Status: DC | PRN

## 2024-02-18 MED ORDER — PIPERACILLIN-TAZOBACTAM 3.375 G IVPB
3.3750 g | Freq: Three times a day (TID) | INTRAVENOUS | Status: DC
  Administered 2024-02-19 – 2024-02-22 (×11): 3.375 g via INTRAVENOUS
  Filled 2024-02-18 (×11): qty 50

## 2024-02-18 MED ORDER — KCL IN DEXTROSE-NACL 20-5-0.45 MEQ/L-%-% IV SOLN: Freq: Once | INTRAVENOUS | Status: DC

## 2024-02-18 MED ORDER — ACETAMINOPHEN 650 MG RE SUPP: 650.0000 mg | Freq: Four times a day (QID) | RECTAL | Status: DC | PRN

## 2024-02-18 MED ORDER — SODIUM CHLORIDE 0.9% FLUSH
3.0000 mL | Freq: Two times a day (BID) | INTRAVENOUS | Status: DC
  Administered 2024-02-19 – 2024-02-22 (×5): 3 mL via INTRAVENOUS

## 2024-02-18 MED ORDER — SODIUM CHLORIDE 0.9 % IV SOLN: INTRAVENOUS | Status: DC

## 2024-02-18 NOTE — ED Notes (Signed)
 Pending transfer plan/ dispo. D/w [facility] EDP, [facility], and [facility] ED CN

## 2024-02-18 NOTE — Progress Notes (Signed)
 NEW ADMISSION NOTE New Admission Note:   Arrival Method: stretcher from [place]. Walking in room. Steady gait. Mental Orientation: alert and oriented Telemetry: none Assessment: Completed Skin: intact IV: PIV NSL R forearm Pain: 10/10 upper abdomen Tubes: none Safety Measures: Safety Fall Prevention Plan has been given, discussed and signed Admission: Completed [facility] Orientation: Patient has been orientated to the room, unit and staff.  Family: notified by pt  Orders have been reviewed and implemented. Will continue to monitor the patient. Call light has been placed within reach and bed alarm has been activated. Dr. Melvin just assigned as admitting MD and on way to see pt. No orders at present. NPO all day except ice chips  Rosina DELENA Miguel, RN

## 2024-02-18 NOTE — ED Notes (Signed)
 EDP at [facility]

## 2024-02-18 NOTE — ED Provider Notes (Signed)
 Patient is awaiting transfer to [facility] health.  Continues to have some discomfort, I attempted to reach out to see if patient would be appropriate for ER to ER transfer.  Unfortunately there ER is also on we route at this time, and will be difficult to have the patient sent over ER to ER.  Continues to await bed placement at this time.  Fortunately vitals remained stable for now.   [name] HERO, MD 02/18/24 1440

## 2024-02-18 NOTE — ED Notes (Addendum)
 [facility] contacted for transfer, initiating transfer for ERCP. Establishing timeframe. Pending response. Discussed with [facility] EDP.

## 2024-02-18 NOTE — ED Provider Notes (Signed)
 Clinical Course as of 02/18/24 2002  Sat Feb 18, 2024  1507 Transfer: Choledocolithiasis, getting transferred to [facility]. Accepted by medicine service awaiting bed.  [HD]  1509 Dr. Dr. Cathern IM accepted patient.  [HD]  1518 Temp: 99.2 F (37.3 C) [HD]  1623 Patient has now been on oral care for over 28 hours with no lateral movement.  We have reached back out to our secretary to call the transfer center to see if patient can go emergency department to the emergency department [HD]  1644 Notified by nursing that patient now does indeed have a bed being ready for transfer [HD]  1644 Patient and family updated on plan. Patient appears unwell, abdomen soft, but tender throughout.  [HD]  8261 Transfer EMS at bedside [HD]    Clinical Course User Index [HD] Nicholaus Rolland BRAVO, MD      Nicholaus Rolland BRAVO, MD 02/18/24 2002

## 2024-02-18 NOTE — ED Notes (Signed)
 EMTALA and transfer signature reviewed by this RN
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
## 2024-02-19 ENCOUNTER — Inpatient Hospital Stay (HOSPITAL_COMMUNITY)

## 2024-02-19 ENCOUNTER — Encounter (HOSPITAL_COMMUNITY)
Admission: EM | Disposition: A | Discharge status: Home / Self Care | Payer: Self-pay | Source: Other Acute Inpatient Hospital | Attending: Family Medicine

## 2024-02-19 ENCOUNTER — Encounter (HOSPITAL_COMMUNITY): Payer: Self-pay | Admitting: Internal Medicine

## 2024-02-19 DIAGNOSIS — I10 Essential (primary) hypertension: Secondary | ICD-10-CM | POA: Diagnosis not present

## 2024-02-19 DIAGNOSIS — T886XXA Anaphylactic reaction due to adverse effect of correct drug or medicament properly administered, initial encounter: Secondary | ICD-10-CM | POA: Diagnosis not present

## 2024-02-19 DIAGNOSIS — K8050 Calculus of bile duct without cholangitis or cholecystitis without obstruction: Secondary | ICD-10-CM | POA: Diagnosis not present

## 2024-02-19 DIAGNOSIS — K810 Acute cholecystitis: Secondary | ICD-10-CM

## 2024-02-19 HISTORY — PX: SPHINCTEROTOMY: SHX5279

## 2024-02-19 HISTORY — PX: STONE EXTRACTION WITH BASKET: SHX5318

## 2024-02-19 HISTORY — PX: PANCREATIC STENT PLACEMENT: SHX5539

## 2024-02-19 LAB — COMPREHENSIVE METABOLIC PANEL WITH GFR
ALT: 175 U/L — ABNORMAL HIGH (ref 0–44)
AST: 75 U/L — ABNORMAL HIGH (ref 15–41)
Albumin: 3.7 g/dL (ref 3.5–5.0)
Alkaline Phosphatase: 100 U/L (ref 38–126)
Anion gap: 9 (ref 5–15)
BUN: 10 mg/dL (ref 8–23)
CO2: 25 mmol/L (ref 22–32)
Calcium: 8.8 mg/dL — ABNORMAL LOW (ref 8.9–10.3)
Chloride: 101 mmol/L (ref 98–111)
Creatinine, Ser: 0.96 mg/dL (ref 0.61–1.24)
GFR, Estimated: >60 mL/min
Glucose, Bld: 114 mg/dL — ABNORMAL HIGH (ref 70–99)
Potassium: 3.5 mmol/L (ref 3.5–5.1)
Sodium: 136 mmol/L (ref 135–145)
Total Bilirubin: 1.9 mg/dL — ABNORMAL HIGH (ref 0.0–1.2)
Total Protein: 6.4 g/dL — ABNORMAL LOW (ref 6.5–8.1)

## 2024-02-19 LAB — POCT I-STAT 7, (LYTES, BLD GAS, ICA,H+H)
Acid-base deficit: 2 mmol/L (ref 0.0–2.0)
Bicarbonate: 21.6 mmol/L (ref 20.0–28.0)
Calcium, Ion: 1.03 mmol/L — ABNORMAL LOW (ref 1.15–1.40)
HCT: 43 % (ref 39.0–52.0)
Hemoglobin: 14.6 g/dL (ref 13.0–17.0)
O2 Saturation: 90 %
Patient temperature: 98
Potassium: 3.3 mmol/L — ABNORMAL LOW (ref 3.5–5.1)
Sodium: 136 mmol/L (ref 135–145)
TCO2: 23 mmol/L (ref 22–32)
pCO2 arterial: 31.1 mmHg — ABNORMAL LOW (ref 32–48)
pH, Arterial: 7.45 (ref 7.35–7.45)
pO2, Arterial: 54 mmHg — ABNORMAL LOW (ref 83–108)

## 2024-02-19 LAB — MRSA NEXT GEN BY PCR, NASAL: MRSA by PCR Next Gen: NOT DETECTED

## 2024-02-19 LAB — CBC
HCT: 41.7 % (ref 39.0–52.0)
Hemoglobin: 14 g/dL (ref 13.0–17.0)
MCH: 29.6 pg (ref 26.0–34.0)
MCHC: 33.6 g/dL (ref 30.0–36.0)
MCV: 88.2 fL (ref 80.0–100.0)
Platelets: 257 K/uL (ref 150–400)
RBC: 4.73 MIL/uL (ref 4.22–5.81)
RDW: 15.6 % — ABNORMAL HIGH (ref 11.5–15.5)
WBC: 9.5 K/uL (ref 4.0–10.5)
nRBC: 0 % (ref 0.0–0.2)

## 2024-02-19 LAB — GLUCOSE, CAPILLARY
Glucose-Capillary: 130 mg/dL — ABNORMAL HIGH (ref 70–99)
Glucose-Capillary: 154 mg/dL — ABNORMAL HIGH (ref 70–99)

## 2024-02-19 SURGERY — ERCP, WITH LITHROTRIPSY OR REMOVAL OF COMMON BILE DUCT CALCULUS USING BASKET
Anesthesia: General

## 2024-02-19 MED ORDER — METHYLPREDNISOLONE SODIUM SUCC 125 MG IJ SOLR
80.0000 mg | Freq: Once | INTRAMUSCULAR | Status: AC
  Administered 2024-02-19: 80 mg via INTRAVENOUS
  Filled 2024-02-19: qty 2

## 2024-02-19 MED ORDER — DOCUSATE SODIUM 50 MG/5ML PO LIQD
100.0000 mg | Freq: Two times a day (BID) | ORAL | Status: DC
  Filled 2024-02-19: qty 10

## 2024-02-19 MED ORDER — PROPOFOL 1000 MG/100ML IV EMUL
INTRAVENOUS | Status: AC
  Filled 2024-02-19: qty 100

## 2024-02-19 MED ORDER — DICLOFENAC SUPPOSITORY 100 MG
RECTAL | Status: DC | PRN
  Administered 2024-02-19: 100 mg via RECTAL

## 2024-02-19 MED ORDER — OXYCODONE HCL 5 MG PO TABS
5.0000 mg | ORAL_TABLET | ORAL | Status: DC | PRN
  Administered 2024-02-20: 5 mg via ORAL
  Filled 2024-02-19: qty 1

## 2024-02-19 MED ORDER — FENTANYL CITRATE (PF) 50 MCG/ML IJ SOSY: 50.0000 ug | PREFILLED_SYRINGE | INTRAMUSCULAR | Status: DC | PRN

## 2024-02-19 MED ORDER — SODIUM CHLORIDE 0.9 % IV SOLN
250.0000 mL | INTRAVENOUS | Status: AC
  Administered 2024-02-19: 250 mL via INTRAVENOUS

## 2024-02-19 MED ORDER — MIDAZOLAM HCL (PF) 2 MG/2ML IJ SOLN
INTRAMUSCULAR | Status: DC | PRN
  Administered 2024-02-19: 2 mg via INTRAVENOUS

## 2024-02-19 MED ORDER — MIDAZOLAM HCL 2 MG/2ML IJ SOLN
INTRAMUSCULAR | Status: AC
  Filled 2024-02-19: qty 2

## 2024-02-19 MED ORDER — EPINEPHRINE HCL 5 MG/250ML IV SOLN IN NS
INTRAVENOUS | Status: DC | PRN
  Administered 2024-02-19: 2 ug/min via INTRAVENOUS

## 2024-02-19 MED ORDER — IPRATROPIUM-ALBUTEROL 0.5-2.5 (3) MG/3ML IN SOLN
3.0000 mL | Freq: Four times a day (QID) | RESPIRATORY_TRACT | Status: DC
  Administered 2024-02-19 – 2024-02-21 (×7): 3 mL via RESPIRATORY_TRACT
  Filled 2024-02-19 (×7): qty 3

## 2024-02-19 MED ORDER — DEXAMETHASONE SOD PHOSPHATE PF 10 MG/ML IJ SOLN
INTRAMUSCULAR | Status: DC | PRN
  Administered 2024-02-19: 10 mg via INTRAVENOUS

## 2024-02-19 MED ORDER — ORAL CARE MOUTH RINSE: 15.0000 mL | OROMUCOSAL | Status: DC | PRN

## 2024-02-19 MED ORDER — LIDOCAINE 2% (20 MG/ML) 5 ML SYRINGE
INTRAMUSCULAR | Status: DC | PRN
  Administered 2024-02-19: 100 mg via INTRAVENOUS

## 2024-02-19 MED ORDER — SUGAMMADEX SODIUM 200 MG/2ML IV SOLN
INTRAVENOUS | Status: DC | PRN
  Administered 2024-02-19: 200 mg via INTRAVENOUS

## 2024-02-19 MED ORDER — ALBUTEROL SULFATE HFA 108 (90 BASE) MCG/ACT IN AERS
INHALATION_SPRAY | RESPIRATORY_TRACT | Status: DC | PRN
  Administered 2024-02-19: 8 via RESPIRATORY_TRACT

## 2024-02-19 MED ORDER — EPINEPHRINE HCL 5 MG/250ML IV SOLN IN NS
INTRAVENOUS | Status: AC
  Filled 2024-02-19: qty 250

## 2024-02-19 MED ORDER — INFLUENZA VAC SPLIT HIGH-DOSE 0.5 ML IM SUSY
0.5000 mL | PREFILLED_SYRINGE | INTRAMUSCULAR | Status: AC
  Administered 2024-02-21: 0.5 mL via INTRAMUSCULAR
  Filled 2024-02-19: qty 0.5

## 2024-02-19 MED ORDER — SODIUM CHLORIDE 0.9 % IV SOLN
INTRAVENOUS | Status: DC | PRN
  Administered 2024-02-19: 30 mL

## 2024-02-19 MED ORDER — PHENYLEPHRINE 80 MCG/ML (10ML) SYRINGE FOR IV PUSH (FOR BLOOD PRESSURE SUPPORT)
PREFILLED_SYRINGE | INTRAVENOUS | Status: DC | PRN
  Administered 2024-02-19: 120 ug via INTRAVENOUS
  Administered 2024-02-19 (×2): 160 ug via INTRAVENOUS
  Administered 2024-02-19: 320 ug via INTRAVENOUS

## 2024-02-19 MED ORDER — PROPOFOL 500 MG/50ML IV EMUL
INTRAVENOUS | Status: DC | PRN
  Administered 2024-02-19: 50 ug/kg/min via INTRAVENOUS

## 2024-02-19 MED ORDER — PROPOFOL 10 MG/ML IV BOLUS
INTRAVENOUS | Status: DC | PRN
  Administered 2024-02-19: 150 mg via INTRAVENOUS
  Administered 2024-02-19: 50 mg via INTRAVENOUS

## 2024-02-19 MED ORDER — METHYLPREDNISOLONE SODIUM SUCC 125 MG IJ SOLR
40.0000 mg | INTRAMUSCULAR | Status: DC
  Administered 2024-02-20 – 2024-02-21 (×2): 40 mg via INTRAVENOUS
  Filled 2024-02-19 (×2): qty 2

## 2024-02-19 MED ORDER — HYDRALAZINE HCL 20 MG/ML IJ SOLN: 5.0000 mg | Freq: Four times a day (QID) | INTRAMUSCULAR | Status: DC | PRN

## 2024-02-19 MED ORDER — EPHEDRINE SULFATE-NACL 50-0.9 MG/10ML-% IV SOSY
PREFILLED_SYRINGE | INTRAVENOUS | Status: DC | PRN
  Administered 2024-02-19: 15 mg via INTRAVENOUS
  Administered 2024-02-19 (×2): 5 mg via INTRAVENOUS

## 2024-02-19 MED ORDER — ONDANSETRON HCL 4 MG/2ML IJ SOLN
INTRAMUSCULAR | Status: DC | PRN
  Administered 2024-02-19: 4 mg via INTRAVENOUS

## 2024-02-19 MED ORDER — EPINEPHRINE 1 MG/10ML IV SOSY
PREFILLED_SYRINGE | INTRAVENOUS | Status: AC
  Filled 2024-02-19: qty 10

## 2024-02-19 MED ORDER — VASOPRESSIN 20 UNIT/ML IV SOLN
INTRAVENOUS | Status: DC | PRN
  Administered 2024-02-19: 6 [IU] via INTRAVENOUS
  Administered 2024-02-19 (×2): 2 [IU] via INTRAVENOUS

## 2024-02-19 MED ORDER — FENTANYL CITRATE (PF) 100 MCG/2ML IJ SOLN
INTRAMUSCULAR | Status: DC | PRN
  Administered 2024-02-19: 100 ug via INTRAVENOUS

## 2024-02-19 MED ORDER — NOREPINEPHRINE 4 MG/250ML-% IV SOLN
INTRAVENOUS | Status: DC | PRN
  Administered 2024-02-19: 3 ug/min via INTRAVENOUS

## 2024-02-19 MED ORDER — FENTANYL CITRATE (PF) 100 MCG/2ML IJ SOLN: 50.0000 ug | INTRAMUSCULAR | Status: DC | PRN

## 2024-02-19 MED ORDER — CIPROFLOXACIN IN D5W 400 MG/200ML IV SOLN
INTRAVENOUS | Status: AC
  Filled 2024-02-19: qty 200

## 2024-02-19 MED ORDER — ALBUMIN HUMAN 5 % IV SOLN: INTRAVENOUS | Status: DC | PRN

## 2024-02-19 MED ORDER — POLYETHYLENE GLYCOL 3350 17 G PO PACK: 17.0000 g | PACK | Freq: Every day | ORAL | Status: AC

## 2024-02-19 MED ORDER — SODIUM CHLORIDE 0.9 % IV SOLN: INTRAVENOUS | Status: DC

## 2024-02-19 MED ORDER — GLUCAGON HCL RDNA (DIAGNOSTIC) 1 MG IJ SOLR
INTRAMUSCULAR | Status: AC
  Filled 2024-02-19: qty 1

## 2024-02-19 MED ORDER — NOREPINEPHRINE 4 MG/250ML-% IV SOLN: 0.0000 ug/min | INTRAVENOUS | Status: DC

## 2024-02-19 MED ORDER — DIPHENHYDRAMINE HCL 50 MG/ML IJ SOLN
INTRAMUSCULAR | Status: DC | PRN
  Administered 2024-02-19: 50 mg via INTRAVENOUS

## 2024-02-19 MED ORDER — ORAL CARE MOUTH RINSE
15.0000 mL | OROMUCOSAL | Status: DC
  Administered 2024-02-19 – 2024-02-20 (×7): 15 mL via OROMUCOSAL

## 2024-02-19 MED ORDER — ROCURONIUM BROMIDE 10 MG/ML (PF) SYRINGE
PREFILLED_SYRINGE | INTRAVENOUS | Status: DC | PRN
  Administered 2024-02-19: 20 mg via INTRAVENOUS
  Administered 2024-02-19: 50 mg via INTRAVENOUS

## 2024-02-19 MED ORDER — GLYCOPYRROLATE 0.2 MG/ML IJ SOLN
INTRAMUSCULAR | Status: DC | PRN
  Administered 2024-02-19: .2 mg via INTRAVENOUS

## 2024-02-19 MED ORDER — METHYLPREDNISOLONE SODIUM SUCC 125 MG IJ SOLR
INTRAMUSCULAR | Status: AC
  Filled 2024-02-19: qty 2

## 2024-02-19 MED ORDER — FENTANYL CITRATE (PF) 100 MCG/2ML IJ SOLN
INTRAMUSCULAR | Status: AC
  Filled 2024-02-19: qty 2

## 2024-02-19 MED ORDER — LACTATED RINGERS IV SOLN: INTRAVENOUS | Status: DC | PRN

## 2024-02-19 MED ORDER — DIPHENHYDRAMINE HCL 50 MG/ML IJ SOLN
50.0000 mg | Freq: Two times a day (BID) | INTRAMUSCULAR | Status: DC
  Administered 2024-02-19 – 2024-02-20 (×2): 50 mg via INTRAVENOUS
  Filled 2024-02-19 (×2): qty 1

## 2024-02-19 MED ORDER — EPINEPHRINE HCL 5 MG/250ML IV SOLN IN NS: 0.5000 ug/min | INTRAVENOUS | Status: DC

## 2024-02-19 MED ORDER — GLUCAGON HCL RDNA (DIAGNOSTIC) 1 MG IJ SOLR
INTRAMUSCULAR | Status: DC | PRN
  Administered 2024-02-19: .5 mg via INTRAVENOUS

## 2024-02-19 MED ORDER — DIPHENHYDRAMINE HCL 50 MG/ML IJ SOLN
INTRAMUSCULAR | Status: AC
  Filled 2024-02-19: qty 2

## 2024-02-19 MED ORDER — PHENYLEPHRINE HCL-NACL 20-0.9 MG/250ML-% IV SOLN
INTRAVENOUS | Status: DC | PRN
  Administered 2024-02-19: 35 ug/min via INTRAVENOUS

## 2024-02-19 MED ORDER — PROPOFOL 1000 MG/100ML IV EMUL
0.0000 ug/kg/min | INTRAVENOUS | Status: DC
  Administered 2024-02-19 (×3): 60 ug/kg/min via INTRAVENOUS
  Administered 2024-02-19: 80 ug/kg/min via INTRAVENOUS
  Administered 2024-02-20 (×2): 60 ug/kg/min via INTRAVENOUS
  Administered 2024-02-20: 40 ug/kg/min via INTRAVENOUS
  Filled 2024-02-19: qty 200
  Filled 2024-02-19 (×4): qty 100

## 2024-02-19 MED ORDER — CHLORHEXIDINE GLUCONATE CLOTH 2 % EX PADS
6.0000 | MEDICATED_PAD | Freq: Every day | CUTANEOUS | Status: DC
  Administered 2024-02-19 – 2024-02-21 (×3): 6 via TOPICAL

## 2024-02-19 MED ORDER — NOREPINEPHRINE 4 MG/250ML-% IV SOLN
INTRAVENOUS | Status: AC
  Filled 2024-02-19: qty 250

## 2024-02-19 MED ORDER — PNEUMOCOCCAL 20-VAL CONJ VACC 0.5 ML IM SUSY
0.5000 mL | PREFILLED_SYRINGE | INTRAMUSCULAR | Status: DC
  Filled 2024-02-19: qty 0.5

## 2024-02-19 NOTE — Transfer of Care (Signed)
 Immediate Anesthesia Transfer of Care Note  Patient: Jonathan Simon  Procedure(s) Performed: ERCP, WITH LITHROTRIPSY OR REMOVAL OF COMMON BILE DUCT CALCULUS USING BASKET SPHINCTEROTOMY, GI  Patient Location: PACU  Anesthesia Type:General  Level of Consciousness: sedated and Patient remains intubated per anesthesia plan  Airway & Oxygen Therapy: Patient remains intubated per anesthesia plan and Patient placed on Ventilator (see vital sign flow sheet for setting)  Post-op Assessment: Report given to RN, Post -op Vital signs reviewed and stable, and Patient moving all extremities  Post vital signs: Reviewed and stable  Last Vitals:  Vitals Value Taken Time  BP 109/69 02/19/24 14:12  Temp    Pulse 65 02/19/24 14:13  Resp 20 02/19/24 14:13  SpO2 97 % 02/19/24 14:13  Vitals shown include unfiled device data.  Last Pain:  Vitals:   02/19/24 1052  TempSrc: Temporal  PainSc: 9       Patients Stated Pain Goal: 0 (02/18/24 1835)  Complications: No notable events documented.

## 2024-02-19 NOTE — Progress Notes (Signed)
 eLink Physician-Brief Progress Note Patient Name: Jonathan Simon DOB: May 13, 1960 MRN: [id number]   Date of Service  02/19/2024  HPI/Events of Note  63 year old male with history of HTN, BPH, alcohol use disorder, emphysema who presents with abdominal pain and being managed for gallstone pancreatitis and choledocholithiasis.  Having urinary retention  eICU Interventions  Retention protocol, IO cath as needed     Intervention Category Minor Interventions: Routine modifications to care plan (e.g. PRN medications for pain, fever)  Jonathan Simon 02/19/2024, 11:47 PM

## 2024-02-19 NOTE — Anesthesia Postprocedure Evaluation (Signed)
"   Anesthesia Post Note  Patient: Jonathan Simon  Procedure(s) Performed: ERCP, WITH LITHROTRIPSY OR REMOVAL OF COMMON BILE DUCT CALCULUS USING BASKET SPHINCTEROTOMY, GI INSERTION, STENT, PANCREATIC DUCT     Patient location during evaluation: PACU Anesthesia Type: General Level of consciousness: awake and alert Pain management: pain level controlled Vital Signs Assessment: post-procedure vital signs reviewed and stable Respiratory status: spontaneous breathing, nonlabored ventilation, respiratory function stable and patient connected to nasal cannula oxygen Cardiovascular status: blood pressure returned to baseline and stable Postop Assessment: no apparent nausea or vomiting Anesthetic complications: yes Comments: Anaphylaxis intraoperatively. Suspect reaction to rocuronium .    No notable events documented.  Last Vitals:  Vitals:   02/19/24 1507 02/19/24 1518  BP: 106/63 109/73  Pulse: 67 65  Resp: 20 20  Temp:    SpO2: 100% 98%    Last Pain:  Vitals:   02/19/24 1423  TempSrc: Oral  PainSc:                  Jonathan Simon      "

## 2024-02-19 NOTE — Hospital Course (Signed)
 Jonathan Simon is a 63 y.o. male with a history of primary hypertension, hyperlipidemia, obesity, alcohol use, emphysema.  Patient presented secondary to chest and abdominal pain, found to have evidence of gallstone pancreatitis. Patient underwent successful ERCP with pancreatic duct stent placement. Hospitalization complicated by development of anaphylaxis requiring a short ICU admission on 12/21 with transfer out on 12/22. General surgery deferring laparoscopic cholecystectomy.
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
## 2024-02-19 NOTE — Plan of Care (Signed)
" °  Problem: Education: Goal: Knowledge of Pancreatitis treatment and prevention will improve Outcome: Progressing   Problem: Health Behavior/Discharge Planning: Goal: Ability to formulate a plan to maintain an alcohol-free life will improve Outcome: Progressing   Problem: Nutritional: Goal: Ability to achieve adequate nutritional intake will improve Outcome: Progressing   Problem: Clinical Measurements: Goal: Complications related to the disease process, condition or treatment will be avoided or minimized Outcome: Progressing   Problem: Education: Goal: Knowledge of Pancreatitis treatment and prevention will improve Outcome: Progressing   Problem: Education: Goal: Knowledge of Pancreatitis treatment and prevention will improve Outcome: Progressing   "
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
## 2024-02-19 NOTE — Anesthesia Preprocedure Evaluation (Addendum)
"                                    Anesthesia Evaluation  Patient identified by MRN, date of birth, ID band Patient awake    Reviewed: Allergy & Precautions, H&P , NPO status , Patient's Chart, lab work & pertinent test results  Airway Mallampati: II  TM Distance: >3 FB Neck ROM: Full    Dental no notable dental hx.    Pulmonary COPD   Pulmonary exam normal breath sounds clear to auscultation       Cardiovascular hypertension, (-) angina (-) Past MI Normal cardiovascular exam Rhythm:Regular Rate:Normal     Neuro/Psych neg Seizures negative neurological ROS  negative psych ROS   GI/Hepatic Neg liver ROS,GERD  ,,Choledocholithiasis   Endo/Other  negative endocrine ROS    Renal/GU negative Renal ROS  negative genitourinary   Musculoskeletal negative musculoskeletal ROS (+)    Abdominal   Peds negative pediatric ROS (+)  Hematology negative hematology ROS (+)   Anesthesia Other Findings   Reproductive/Obstetrics negative OB ROS                              Anesthesia Physical Anesthesia Plan  ASA: 3  Anesthesia Plan: General   Post-op Pain Management:    Induction: Intravenous  PONV Risk Score and Plan: 2 and Ondansetron , Dexamethasone  and Treatment may vary due to age or medical condition  Airway Management Planned: Oral ETT  Additional Equipment: None  Intra-op Plan:   Post-operative Plan: Extubation in OR  Informed Consent: I have reviewed the patients History and Physical, chart, labs and discussed the procedure including the risks, benefits and alternatives for the proposed anesthesia with the patient or authorized representative who has indicated his/her understanding and acceptance.     Dental advisory given  Plan Discussed with: CRNA  Anesthesia Plan Comments:         Anesthesia Quick Evaluation  "

## 2024-02-19 NOTE — Op Note (Signed)
 [facility] Patient Name: Jonathan Simon Procedure Date : 02/19/2024 MRN: [id number] Attending MD: Belvie Just , MD, [id number] Date of Birth: Dec 06, 1960 CSN: [id number] Age: 63 Admit Type: Inpatient Procedure:                ERCP Indications:              Common bile duct stone(s) Providers:                Belvie Just, MD, Collene Edu, RN, Felice Sar,                            Technician Referring MD:             Franky LABOR. Paduchowski Medicines:                General Anesthesia Complications:            No immediate complications. Estimated Blood Loss:     Estimated blood loss: none. Procedure:                Pre-Anesthesia Assessment:                           - Prior to the procedure, a History and Physical                            was performed, and patient medications and                            allergies were reviewed. The patient's tolerance of                            previous anesthesia was also reviewed. The risks                            and benefits of the procedure and the sedation                            options and risks were discussed with the patient.                            All questions were answered, and informed consent                            was obtained. Prior Anticoagulants: The patient has                            taken no anticoagulant or antiplatelet agents. ASA                            Grade Assessment: III - A patient with severe                            systemic disease. After reviewing the risks and  benefits, the patient was deemed in satisfactory                            condition to undergo the procedure.                           - Sedation was administered by an anesthesia                            professional. General anesthesia was attained.                           After obtaining informed consent, the scope was                            passed under direct vision.  Throughout the                            procedure, the patient's blood pressure, pulse, and                            oxygen saturations were monitored continuously. The                            [id number]-Q190V ([id number] Olympus duodenoscope was                            introduced through the mouth, and used to inject                            contrast into and used to inject contrast into the                            bile, dorsal and ventral pancreatic ducts. The ERCP                            was performed with difficulty. The patient                            tolerated the procedure well. Scope In: Scope Out: Findings:      The major papilla was normal. The bile duct was deeply cannulated with       the short-nosed traction sphincterotome. Contrast was injected. I       personally interpreted the bile duct images. There was brisk flow of       contrast through the ducts. Image quality was excellent. Contrast       extended to the gallbladder. Contrast extended to the bifurcation. The       common bile duct contained filling defect(s) thought to be a stone. A       short 0.035 inch Soft Jagwire was passed into the biliary tree. An 8 mm       biliary sphincterotomy was made with a traction (standard)       sphincterotome using ERBE electrocautery. There was no       post-sphincterotomy bleeding. The biliary tree  was swept with a 10 mm       balloon starting at the bifurcation. Sludge was swept from the duct. One       4 Fr by 3 cm plastic pancreatic stent with a single external pigtail and       no internal flaps was placed 3 cm into the ventral pancreatic duct.       Fluid flowed through the stent. The stent was in good position.      During the procedure the patients was hypotensive and he was not       responding well to the pressures, but ultimately anesthesia did       stabilize the blood pressure. Cannulation of the CBD was extremely       difficult. There was preferential  cannulation of the PD. After the third       attempt the guidewire was left in the PD and a two wire technique was       used. This did not aloow for cannulation of the CBD. The Spy Bite forcep       was used to assist in cannulation but the straightening of the duct did       not allow for CBD cannulation. An external pigstail 4 Fr x 3 cm PD stent       was inserted. Insertion of this stent ultimately help to exclude the PD       and allow for better angulation into the CBD. The guidewire was secured       in the CBD. Contrast injection revealed a small CBD measuring 3-4 mm.       There appeared to be some filling defect in the distal CBD. A       sphincterotomy was created along side of the PD stent, but it was       difficult to completely maneuver the sphincterotome around the PD stent.       A limited sphincterotomy was achieved. The CBD was swept 3 times and       some scant sludge was removed. No stone was removed. The PD stent was       repositioned with the Rat-toothed forceps.      The patient was not able to be extubated as it appeared he had some type       of an allergic anaphylactic reaction to some latex or medication. He was       noted to be erythematous with wheals in his lower extremities and upper       part of his chest and arms. The patient is stable and well supported. Impression:               - The major papilla appeared normal.                           - A filling defect consistent with a stone was seen                            on the cholangiogram.                           - A biliary sphincterotomy was performed.                           - The  biliary tree was swept and sludge was found.                           - One plastic pancreatic stent was placed into the                            ventral pancreatic duct. Recommendation:           - Transfer to ICU.                           - Treatment for the anaphylaxis per ICU.                           - Clear  liquid diet.                           - KUB in two weeks to ensure that the PD stent fell                            out.                           - Lap chole per Surgery. Procedure Code(s):        --- Professional ---                           [phone], Endoscopic retrograde                            cholangiopancreatography (ERCP); with placement of                            endoscopic stent into biliary or pancreatic duct,                            including pre- and post-dilation and guide wire                            passage, when performed, including sphincterotomy,                            when performed, each stent                           43264, Endoscopic retrograde                            cholangiopancreatography (ERCP); with removal of                            calculi/debris from biliary/pancreatic duct(s)                           43262, 59, Endoscopic retrograde  cholangiopancreatography (ERCP); with                            sphincterotomy/papillotomy                           [phone], Endoscopic catheterization of the biliary                            ductal system, radiological supervision and                            interpretation Diagnosis Code(s):        --- Professional ---                           K80.50, Calculus of bile duct without cholangitis                            or cholecystitis without obstruction                           R93.2, Abnormal findings on diagnostic imaging of                            liver and biliary tract CPT copyright 2022 American Medical Association. All rights reserved. The codes documented in this report are preliminary and upon coder review may  be revised to meet current compliance requirements. Belvie Just, MD Belvie Just, MD 02/19/2024 1:43:52 PM This report has been signed electronically. Number of Addenda: 0

## 2024-02-19 NOTE — Anesthesia Procedure Notes (Signed)
 Procedure Name: Intubation Date/Time: 02/19/2024 11:20 AM  Performed by: Jerl Donald LABOR, CRNAPre-anesthesia Checklist: Patient identified, Emergency Drugs available, Suction available and Patient being monitored Patient Re-evaluated:Patient Re-evaluated prior to induction Oxygen Delivery Method: Circle System Utilized Preoxygenation: Pre-oxygenation with 100% oxygen Induction Type: IV induction Ventilation: Mask ventilation without difficulty Laryngoscope Size: Mac and 3 Grade View: Grade II Tube type: Oral Tube size: 7.0 mm Number of attempts: 1 Airway Equipment and Method: Stylet and Oral airway Placement Confirmation: ETT inserted through vocal cords under direct vision, positive ETCO2 and breath sounds checked- equal and bilateral Secured at: 23 cm Tube secured with: Tape Dental Injury: Teeth and Oropharynx as per pre-operative assessment

## 2024-02-19 NOTE — Progress Notes (Signed)
 Pt transported from PACU bay 11 to 2H20 via ventilator without complications.

## 2024-02-19 NOTE — Progress Notes (Signed)
 Contacted by Dr. Rollin of GI-patient apparently had an anaphylactic reaction intra procedure--- procedure was about 2 hours long--- discussed at bedside with Dr. Treen:-Received Decadron  as well as Benadryl  so far-needed Levophed  throughout the procedure and was started on epi subsequently Swelling at bedside seems improved according to nursing staff CRNA and Dr. Erma he is intubated he has swelling of his right  Lid swelling of his right upper lip he has a diffuse macular rash over his abdomen and some over his upper chest S1-S2 no murmur Abdomen soft slight distention Slightly tremulous?  Asterixis   I have updated the patient's sister Charlies on the phone and informed her of the events General Surgery is aware of patient and once patient stabilizes I suppose will be transferred to floor and they can evaluate for lap chole  For now he will be going to the ICU under critical care service-appreciate their cares  > 30 minutes critical care time

## 2024-02-19 NOTE — Progress Notes (Signed)
 [facility]  Jonathan Simon FMW:[id number]  DOB: [birth year]  DOA: 02/18/2024  PCP: Marylynn Verneita CROME, MD  02/19/2024,9:44 AM  LOS: 1 day    Code Status: Full     from: Home   63 year old male EtOH dependence in remission since 2023 with steatohepatitis HTN HLD Reflux Obesity Previous admission 2023 for diverticulitis Previous history of spontaneous bilateral pneumothoraces in the past, history of traumatic injury in the 80s to abdomen on a construction site with end colostomy previously now reversed  Admission from [facility] secondary to severe abdominal pain found to have lipase 2800 AST/ALT 175/274 alk phos 138 bili 2.6 CT showed cholelithiasis inflammation of gallbladder US  RUQ choledocholithiasis with acute cholecystitis Eventually transferred to [facility] Dr. Rollin consulted and doing ERCP this morning 12/21 General Surgery consulted   Assessment  & Plan :    Acute choledocholithiasis with probable CBD stone 6 mm ERCP today, continue Zosyn  3.375 every 8, saline 100 cc/h--- repeat lipase LFT a.m. Pain control Tylenol  650, Dilaudid  0.5-1 Q2 for now-adding Oxy IR after intervention by GI General surgery peripherally aware patient--- will evaluate for eventual lap chole  HTN Holding losartan  100 amlodipine  10 Aldactone  50 for now Will need discussion about steatohepatitis in the outpatient setting with GI etc. As needed hydralazine  iv for blood pressure >160  Previous EtOH, steatohepatitis FibroSure-4 testing as an outpatient  Hypokalemia on admission Replaced and improved-labs a.m.  Mild obesity BMI 28 Outpatient follow-up   Data Reviewed today: Sodium 136 potassium 3.5 chloride 101 BUN/creatinine 10/0.9--AST/ALT 75/175: Bilirubin 1.9--WBC 9.5 platelet 257 hemoglobin 14   DVT prophylaxis: SCD   Dispo/Global plan: ERCP then potential lap chole     Subjective:   Pain is about 8-9/10 N.p.o. for procedure No vomiting or dark stool      Objective + exam Vitals:    02/18/24 1835 02/19/24 0600 02/19/24 0700 02/19/24 0812  BP: (!) 151/94 (!) 145/78  135/79  Pulse: 67 62  (!) 59  Resp: 18   18  Temp: 98.1 F (36.7 C) 98.5 F (36.9 C)  98.6 F (37 C)  TempSrc: Oral Oral    SpO2: 95% 100%  96%  Weight:      Height:   5' 11 (1.803 m)    Filed Weights   02/18/24 1832  Weight: 92.2 kg     Examination: EOMI NCAT no focal deficit looks about stated age well-built nourished-power 5/5 bilaterally no lower extremity edema Abdomen distended no rebound no guarding RUQ tenderness present CTAB no added sound S1-S2 no murmur     Scheduled Meds:  [START ON 02/20/2024] Influenza vac split trivalent PF  0.5 mL Intramuscular Tomorrow-1000   [START ON 02/20/2024] pneumococcal 20-valent conjugate vaccine  0.5 mL Intramuscular Tomorrow-1000   sodium chloride  flush  3 mL Intravenous Q12H   Continuous Infusions:  sodium chloride  100 mL/hr at 02/19/24 0938   sodium chloride  Stopped (02/19/24 0745)   piperacillin -tazobactam 12.5 mL/hr at 02/19/24 9061   acetaminophen  **OR** acetaminophen , albuterol , HYDROmorphone  (DILAUDID ) injection, ondansetron  (ZOFRAN ) IV  Time 50  Colen Grimes, MD  [facility]

## 2024-02-20 ENCOUNTER — Encounter (HOSPITAL_COMMUNITY): Payer: Self-pay | Admitting: Gastroenterology

## 2024-02-20 DIAGNOSIS — K838 Other specified diseases of biliary tract: Secondary | ICD-10-CM

## 2024-02-20 DIAGNOSIS — K851 Biliary acute pancreatitis without necrosis or infection: Secondary | ICD-10-CM | POA: Diagnosis not present

## 2024-02-20 DIAGNOSIS — T782XXA Anaphylactic shock, unspecified, initial encounter: Secondary | ICD-10-CM | POA: Diagnosis not present

## 2024-02-20 LAB — COMPREHENSIVE METABOLIC PANEL WITH GFR
ALT: 113 U/L — ABNORMAL HIGH (ref 0–44)
AST: 42 U/L — ABNORMAL HIGH (ref 15–41)
Albumin: 3.6 g/dL (ref 3.5–5.0)
Alkaline Phosphatase: 68 U/L (ref 38–126)
Anion gap: 13 (ref 5–15)
BUN: 13 mg/dL (ref 8–23)
CO2: 22 mmol/L (ref 22–32)
Calcium: 8.7 mg/dL — ABNORMAL LOW (ref 8.9–10.3)
Chloride: 101 mmol/L (ref 98–111)
Creatinine, Ser: 1.21 mg/dL (ref 0.61–1.24)
GFR, Estimated: >60 mL/min
Glucose, Bld: 195 mg/dL — ABNORMAL HIGH (ref 70–99)
Potassium: 3.8 mmol/L (ref 3.5–5.1)
Sodium: 136 mmol/L (ref 135–145)
Total Bilirubin: 1.3 mg/dL — ABNORMAL HIGH (ref 0.0–1.2)
Total Protein: 5.8 g/dL — ABNORMAL LOW (ref 6.5–8.1)

## 2024-02-20 LAB — CBC
HCT: 41.9 % (ref 39.0–52.0)
Hemoglobin: 14.4 g/dL (ref 13.0–17.0)
MCH: 29.8 pg (ref 26.0–34.0)
MCHC: 34.4 g/dL (ref 30.0–36.0)
MCV: 86.7 fL (ref 80.0–100.0)
Platelets: 265 K/uL (ref 150–400)
RBC: 4.83 MIL/uL (ref 4.22–5.81)
RDW: 15.3 % (ref 11.5–15.5)
WBC: 12.8 K/uL — ABNORMAL HIGH (ref 4.0–10.5)
nRBC: 0 % (ref 0.0–0.2)

## 2024-02-20 LAB — LIPASE, BLOOD: Lipase: 17 U/L (ref 11–51)

## 2024-02-20 LAB — GLUCOSE, CAPILLARY
Glucose-Capillary: 105 mg/dL — ABNORMAL HIGH (ref 70–99)
Glucose-Capillary: 134 mg/dL — ABNORMAL HIGH (ref 70–99)
Glucose-Capillary: 150 mg/dL — ABNORMAL HIGH (ref 70–99)
Glucose-Capillary: 182 mg/dL — ABNORMAL HIGH (ref 70–99)
Glucose-Capillary: 184 mg/dL — ABNORMAL HIGH (ref 70–99)
Glucose-Capillary: 196 mg/dL — ABNORMAL HIGH (ref 70–99)

## 2024-02-20 LAB — TRIGLYCERIDES: Triglycerides: 82 mg/dL

## 2024-02-20 MED ORDER — ORAL CARE MOUTH RINSE
15.0000 mL | Freq: Four times a day (QID) | OROMUCOSAL | Status: AC
  Administered 2024-02-20 – 2024-02-22 (×7): 15 mL via OROMUCOSAL

## 2024-02-20 MED ORDER — DOCUSATE SODIUM 50 MG/5ML PO LIQD
100.0000 mg | Freq: Two times a day (BID) | ORAL | Status: DC
  Administered 2024-02-20 – 2024-02-21 (×4): 100 mg via ORAL
  Filled 2024-02-20 (×5): qty 10

## 2024-02-20 MED ORDER — POLYETHYLENE GLYCOL 3350 17 G PO PACK
17.0000 g | PACK | Freq: Every day | ORAL | Status: DC
  Administered 2024-02-20 – 2024-02-21 (×2): 17 g via ORAL
  Filled 2024-02-20 (×3): qty 1

## 2024-02-20 MED ORDER — DIPHENHYDRAMINE HCL 25 MG PO CAPS
25.0000 mg | ORAL_CAPSULE | Freq: Three times a day (TID) | ORAL | Status: AC
  Administered 2024-02-20 (×2): 25 mg via ORAL
  Filled 2024-02-20 (×2): qty 1

## 2024-02-20 MED ORDER — INSULIN ASPART 100 UNIT/ML IJ SOLN
2.0000 [IU] | INTRAMUSCULAR | Status: AC
  Administered 2024-02-20: 4 [IU] via SUBCUTANEOUS
  Administered 2024-02-20 – 2024-02-21 (×3): 2 [IU] via SUBCUTANEOUS
  Filled 2024-02-20 (×2): qty 1
  Filled 2024-02-20: qty 2
  Filled 2024-02-20: qty 1

## 2024-02-20 MED ORDER — ENOXAPARIN SODIUM 40 MG/0.4ML IJ SOSY
40.0000 mg | PREFILLED_SYRINGE | Freq: Every day | INTRAMUSCULAR | Status: AC
  Administered 2024-02-20 – 2024-02-22 (×3): 40 mg via SUBCUTANEOUS
  Filled 2024-02-20 (×3): qty 0.4

## 2024-02-20 MED ORDER — KETOROLAC TROMETHAMINE 15 MG/ML IJ SOLN
15.0000 mg | Freq: Three times a day (TID) | INTRAMUSCULAR | Status: DC
  Administered 2024-02-20 – 2024-02-22 (×7): 15 mg via INTRAVENOUS
  Filled 2024-02-20 (×7): qty 1

## 2024-02-20 MED ORDER — DIPHENHYDRAMINE HCL 25 MG PO CAPS: 25.0000 mg | ORAL_CAPSULE | Freq: Three times a day (TID) | ORAL | Status: DC

## 2024-02-20 NOTE — Progress Notes (Signed)
 02/20/2024  Seen in f/u for anaphylactoid reaction  S: No events. On sedation  O:    02/20/2024    8:00 AM 02/20/2024    7:30 AM 02/20/2024    7:00 AM  Vitals with BMI  Systolic 113 123 883  Diastolic 80 68 72  Pulse 71 77 77  Lip swelling better Lung mechanics fine Starting to move around Triggers vent Abd looks benign  LFTs look good  A: Anaphylaxis, unclear culprit, improved Gallstone pancreatitis s/p PD stent placement Choledocholithiasis Hx of COPD Urinary retention HTN  P: Benadryl  for a couple days SAT/SBT, cuff leak check, then wean to extubate Timing of cholecystectomy TBD Check AM CBC Qshift bladder scan, suspect will start voiding once mobilizes To floor if stable off vent, appreciate [facility] taking back over 02/21/24 Okay to start DVT ppx Can resume home antihypertensives PRN  31 min cc time  Rolan Sharps MD Pulmonary Critical Care Medicine Secure[employer] if during day (7a-7p) [phone] if after hours (7p-7a)

## 2024-02-20 NOTE — Progress Notes (Signed)
 "   Inpatient Progress Note     Patient Profile/Chief Complaint  63 year old male with a PMH of HTN, ETOH hepatitis, GERD, and diverticulitis admitted with abdominal pain, gallstone pancreatitis and CBD dilation.  ERCP 02/19/2024 with scant sludge and CBD status post PD stent  Patient hypotensive during ERCP and determined to have allergic anaphylactic reaction for which he was transferred to the ICU -now extubated   Interval History   - Extubated this morning with improvement in facial swelling - Abdominal pain improved - LFTs downtrending: AST 42, ALT 113, total bili 1.3   Objective   Vital signs in last 24 hours: Temp:  [97.6 F (36.4 C)-98.3 F (36.8 C)] 98.3 F (36.8 C) (12/22 1249) Pulse Rate:  [55-79] 69 (12/22 1249) Resp:  [10-21] 16 (12/22 1249) BP: (83-131)/(45-97) 115/71 (12/22 1249) SpO2:  [96 %-100 %] 98 % (12/22 1249) FiO2 (%):  [40 %] 40 % (12/22 0840) Weight:  [93.1 kg] 93.1 kg (12/21 1507) Last BM Date : 02/17/24 General:    Sitting in chair at bedside with mild swelling of lips and around eyes Heart:  Regular rate and rhythm; no murmurs Lungs: Respirations even and unlabored, lungs CTA bilaterally Abdomen:  Soft, mild tenderness to palpation in mid abdomen and nondistended. Normal bowel sounds. Extremities:  Without edema. Neurologic:  Alert and oriented,  grossly normal neurologically. Psych:  Cooperative. Normal mood and affect.  Intake/Output from previous day: 12/21 0701 - 12/22 0700 In: 3608.4 [I.V.:2703.7; IV Piggyback:904.7] Out: 1200 [Urine:1200] Intake/Output this shift: Total I/O In: 68.1 [I.V.:28; IV Piggyback:40.1] Out: 780 [Urine:780]  Lab Results: Recent Labs    02/18/24 1941 02/19/24 0313 02/19/24 1727 02/20/24 0819  WBC 10.2 9.5  --  12.8*  HGB 14.2 14.0 14.6 14.4  HCT 43.0 41.7 43.0 41.9  PLT 250 257  --  265   BMET Recent Labs    02/18/24 1941 02/19/24 0313 02/19/24 1727 02/20/24 0156  NA 139 136 136 136  K 3.7  3.5 3.3* 3.8  CL 102 101  --  101  CO2 29 25  --  22  GLUCOSE 74 114*  --  195*  BUN 11 10  --  13  CREATININE 1.08 0.96  --  1.21  CALCIUM  8.9 8.8*  --  8.7*   LFT Recent Labs    02/17/24 1508 02/18/24 1941 02/20/24 0156  PROT 7.2   < > 5.8*  ALBUMIN  4.5   < > 3.6  AST 175*   < > 42*  ALT 264*   < > 113*  ALKPHOS 138*   < > 68  BILITOT 2.6*   < > 1.3*  BILIDIR 1.6*  --   --   IBILI 0.9  --   --    < > = values in this interval not displayed.   PT/INR No results for input(s): LABPROT, INR in the last 72 hours.  Studies/Results: DG C-Arm 1-60 Min-No Report Result Date: 02/19/2024 Fluoroscopy was utilized by the requesting physician.  No radiographic interpretation.   DG C-Arm 1-60 Min-No Report Result Date: 02/19/2024 Fluoroscopy was utilized by the requesting physician.  No radiographic interpretation.    Endoscopic Studies: ERCP 02/19/2024 with scant sludge and CBD status post PD stent   Clinical Impression   63 year old male with a PMH of HTN, ETOH hepatitis, GERD, and diverticulitis admitted with abdominal pain, gallstone pancreatitis and CBD dilation.  ERCP 02/19/2024 with scant sludge and CBD status post PD stent -LFTs now downtrending.  Patient  hypotensive during ERCP and determined to have allergic anaphylactic reaction for which he was transferred to the ICU -now extubated.  Overall appears clinically improved compared to yesterday.   Plan  Continue to trend LFTs status post ERCP Patient will need KUB in 2 weeks to confirm pancreatic stent has fallen out Continue Zosyn  Continue clear liquid diet General Surgery following to determine timing of lap chole pending resolution of anaphylactic reaction    LOS: 2 days   Inocente CHRISTELLA Hausen  02/20/2024, 2:00 PM  Inocente Hausen, MD [facility]  "

## 2024-02-20 NOTE — Procedures (Signed)
 Extubation Procedure Note  Patient Details:   Name: Jonathan Simon DOB: 1960-09-05 MRN: [id number]   Airway Documentation:    Vent end date: 02/20/24 Vent end time: 0840   Evaluation  O2 sats: stable throughout Complications: No apparent complications Patient did tolerate procedure well. Bilateral Breath Sounds: Clear, Diminished   Yes  RT extubated patient to 4L  per MD order with RN at bedside. Positive cuff leak noted. Patient tolerating well at this time and no stridor or distress. RT will continue to monitor as needed.   [name] 02/20/2024, 8:47 AM

## 2024-02-20 NOTE — Progress Notes (Signed)
 Pt arrived from ...2H.., A/ox .4.Marland Kitchenpt denies any pain, MD aware,CCMD called. CHG bath given,no further needs at this time

## 2024-02-20 NOTE — Progress Notes (Signed)
 "  Progress Note  1 Day Post-Op  Subjective: Family at bedside.  Noted to have some urinary retention requiring IO cath as needed.   ROS  All negative with the exception of above.  Objective: Vital signs in last 24 hours: Temp:  [97.6 F (36.4 C)-98.9 F (37.2 C)] 98 F (36.7 C) (12/22 0826) Pulse Rate:  [55-77] 71 (12/22 0800) Resp:  [10-21] 17 (12/22 0800) BP: (83-156)/(45-94) 113/80 (12/22 0800) SpO2:  [96 %-100 %] 100 % (12/22 0800) FiO2 (%):  [40 %] 40 % (12/22 0356) Weight:  [93.1 kg] 93.1 kg (12/21 1507) Last BM Date : 02/17/24  Intake/Output from previous day: 12/21 0701 - 12/22 0700 In: 3608.4 [I.V.:2703.7; IV Piggyback:904.7] Out: 1200 [Urine:1200] Intake/Output this shift: Total I/O In: 30.9 [I.V.:28; IV Piggyback:2.9] Out: -   PE: General: Intubated male who is laying in bed in NAD. Heart: HR during encounter 71.  Lungs: Respiratory effort assisted. Abd: Soft with mild distention.  Skin: Warm and dry.    Lab Results:  Recent Labs    02/18/24 1941 02/19/24 0313 02/19/24 1727  WBC 10.2 9.5  --   HGB 14.2 14.0 14.6  HCT 43.0 41.7 43.0  PLT 250 257  --    BMET Recent Labs    02/19/24 0313 02/19/24 1727 02/20/24 0156  NA 136 136 136  K 3.5 3.3* 3.8  CL 101  --  101  CO2 25  --  22  GLUCOSE 114*  --  195*  BUN 10  --  13  CREATININE 0.96  --  1.21  CALCIUM  8.8*  --  8.7*   PT/INR No results for input(s): LABPROT, INR in the last 72 hours. CMP     Component Value Date/Time   NA 136 02/20/2024 0156   K 3.8 02/20/2024 0156   CL 101 02/20/2024 0156   CO2 22 02/20/2024 0156   GLUCOSE 195 (H) 02/20/2024 0156   BUN 13 02/20/2024 0156   CREATININE 1.21 02/20/2024 0156   CALCIUM  8.7 (L) 02/20/2024 0156   PROT 5.8 (L) 02/20/2024 0156   ALBUMIN  3.6 02/20/2024 0156   AST 42 (H) 02/20/2024 0156   ALT 113 (H) 02/20/2024 0156   ALKPHOS 68 02/20/2024 0156   BILITOT 1.3 (H) 02/20/2024 0156   GFRNONAA >60 02/20/2024 0156   GFRAA >60  01/31/2015 1224   Lipase     Component Value Date/Time   LIPASE 17 02/20/2024 0156       Studies/Results: DG C-Arm 1-60 Min-No Report Result Date: 02/19/2024 Fluoroscopy was utilized by the requesting physician.  No radiographic interpretation.   DG C-Arm 1-60 Min-No Report Result Date: 02/19/2024 Fluoroscopy was utilized by the requesting physician.  No radiographic interpretation.    Anti-infectives: Anti-infectives (From admission, onward)    Start     Dose/Rate Route Frequency Ordered Stop   02/19/24 0000  piperacillin -tazobactam (ZOSYN ) IVPB 3.375 g        3.375 g 12.5 mL/hr over 240 Minutes Intravenous Every 8 hours 02/18/24 1911          Assessment/Plan 63 yo male with choledocholithiasis, pancreatitis s/p ERCP complicated by anaphylaxis  -ERCP completed on 12/21 with placement of PD stent and removal of sludge.  -Afebrile. -Lipase 17, Tbili 1.3, AST 42, ALT 113 -Abdomen soft. -Will plan to discuss surgical options further once anaphylaxis complications have been resolved and patient has been extubated.    FEN: NPO; Sodium chloride  infusions at 10-20 mL/hr.  VTE: Lovenox  ID: Zosyn   LOS: 2 days   I reviewed critical care notes, hospitalist notes, consulting provider notes,  nursing notes, last 24 h vitals and pain scores, last 48 h intake and output, last 24 h labs and trends, and last 24 h imaging results.  This care required moderate level of medical decision making.    Marjorie Carlyon Favre, [facility] 02/20/2024, 8:36 AM Please see Amion for pager number during day hours 7:00am-4:30pm  "

## 2024-02-20 NOTE — Plan of Care (Signed)
" °  Problem: Education: Goal: Knowledge of Pancreatitis treatment and prevention will improve Outcome: Progressing   Problem: Health Behavior/Discharge Planning: Goal: Ability to formulate a plan to maintain an alcohol-free life will improve Outcome: Progressing   Problem: Nutritional: Goal: Ability to achieve adequate nutritional intake will improve Outcome: Progressing   Problem: Clinical Measurements: Goal: Complications related to the disease process, condition or treatment will be avoided or minimized Outcome: Progressing   Problem: Education: Goal: Knowledge of General Education information will improve Description: Including pain rating scale, medication(s)/side effects and non-pharmacologic comfort measures Outcome: Progressing   Problem: Health Behavior/Discharge Planning: Goal: Ability to manage health-related needs will improve Outcome: Progressing   Problem: Clinical Measurements: Goal: Ability to maintain clinical measurements within normal limits will improve Outcome: Progressing Goal: Will remain free from infection Outcome: Progressing Goal: Diagnostic test results will improve Outcome: Progressing Goal: Respiratory complications will improve Outcome: Progressing Goal: Cardiovascular complication will be avoided Outcome: Progressing   Problem: Activity: Goal: Risk for activity intolerance will decrease Outcome: Progressing   Problem: Nutrition: Goal: Adequate nutrition will be maintained Outcome: Progressing   Problem: Coping: Goal: Level of anxiety will decrease Outcome: Progressing   Problem: Elimination: Goal: Will not experience complications related to bowel motility Outcome: Progressing Goal: Will not experience complications related to urinary retention Outcome: Progressing   Problem: Pain Managment: Goal: General experience of comfort will improve and/or be controlled Outcome: Progressing   Problem: Safety: Goal: Ability to remain free  from injury will improve Outcome: Progressing   Problem: Skin Integrity: Goal: Risk for impaired skin integrity will decrease Outcome: Progressing   "

## 2024-02-21 ENCOUNTER — Telehealth: Payer: Self-pay

## 2024-02-21 ENCOUNTER — Other Ambulatory Visit: Payer: Self-pay

## 2024-02-21 DIAGNOSIS — K8050 Calculus of bile duct without cholangitis or cholecystitis without obstruction: Secondary | ICD-10-CM

## 2024-02-21 DIAGNOSIS — Z9889 Other specified postprocedural states: Secondary | ICD-10-CM

## 2024-02-21 LAB — CBC
HCT: 39.7 % (ref 39.0–52.0)
Hemoglobin: 13.2 g/dL (ref 13.0–17.0)
MCH: 29.8 pg (ref 26.0–34.0)
MCHC: 33.2 g/dL (ref 30.0–36.0)
MCV: 89.6 fL (ref 80.0–100.0)
Platelets: 237 K/uL (ref 150–400)
RBC: 4.43 MIL/uL (ref 4.22–5.81)
RDW: 15.9 % — ABNORMAL HIGH (ref 11.5–15.5)
WBC: 11.1 K/uL — ABNORMAL HIGH (ref 4.0–10.5)
nRBC: 0 % (ref 0.0–0.2)

## 2024-02-21 LAB — GLUCOSE, CAPILLARY
Glucose-Capillary: 100 mg/dL — ABNORMAL HIGH (ref 70–99)
Glucose-Capillary: 110 mg/dL — ABNORMAL HIGH (ref 70–99)
Glucose-Capillary: 113 mg/dL — ABNORMAL HIGH (ref 70–99)
Glucose-Capillary: 115 mg/dL — ABNORMAL HIGH (ref 70–99)
Glucose-Capillary: 119 mg/dL — ABNORMAL HIGH (ref 70–99)
Glucose-Capillary: 134 mg/dL — ABNORMAL HIGH (ref 70–99)
Glucose-Capillary: 139 mg/dL — ABNORMAL HIGH (ref 70–99)

## 2024-02-21 LAB — COMPREHENSIVE METABOLIC PANEL WITH GFR
ALT: 92 U/L — ABNORMAL HIGH (ref 0–44)
AST: 32 U/L (ref 15–41)
Albumin: 3.6 g/dL (ref 3.5–5.0)
Alkaline Phosphatase: 68 U/L (ref 38–126)
Anion gap: 12 (ref 5–15)
BUN: 16 mg/dL (ref 8–23)
CO2: 25 mmol/L (ref 22–32)
Calcium: 8.7 mg/dL — ABNORMAL LOW (ref 8.9–10.3)
Chloride: 104 mmol/L (ref 98–111)
Creatinine, Ser: 1.09 mg/dL (ref 0.61–1.24)
GFR, Estimated: >60 mL/min
Glucose, Bld: 124 mg/dL — ABNORMAL HIGH (ref 70–99)
Potassium: 4.2 mmol/L (ref 3.5–5.1)
Sodium: 141 mmol/L (ref 135–145)
Total Bilirubin: 0.8 mg/dL (ref 0.0–1.2)
Total Protein: 5.8 g/dL — ABNORMAL LOW (ref 6.5–8.1)

## 2024-02-21 LAB — LIPASE, BLOOD: Lipase: 30 U/L (ref 11–51)

## 2024-02-21 NOTE — Progress Notes (Signed)
 "   2 Days Post-Op  Subjective: CC: Pateint extubated yesterday. He reports no abdominal pain after extubation. He developed MEG and RUQ abdominal pain this AM around 2am. Pain is not worse after PO intake and he reports he has been able to drink a large amount of CLD without worsening of his symptoms or n/v.   Objective: Vital signs in last 24 hours: Temp:  [98 F (36.7 C)-98.6 F (37 C)] 98.6 F (37 C) (12/23 1119) Pulse Rate:  [66-72] 72 (12/23 1119) Resp:  [14-16] 15 (12/23 1119) BP: (105-129)/(58-80) 129/80 (12/23 1119) SpO2:  [96 %-99 %] 96 % (12/23 1119) FiO2 (%):  [21 %] 21 % (12/23 0219) Last BM Date : 02/17/24  Intake/Output from previous day: 12/22 0701 - 12/23 0700 In: 468.1 [P.O.:400; I.V.:28; IV Piggyback:40.1] Out: 2430 [Urine:2430] Intake/Output this shift: No intake/output data recorded.  PE: Gen:  Alert, NAD, pleasant Abd: Soft, ND, generalized ttp that is greatest in the epigastrium and ruq  Lab Results:  Recent Labs    02/19/24 0313 02/19/24 1727 02/20/24 0819  WBC 9.5  --  12.8*  HGB 14.0 14.6 14.4  HCT 41.7 43.0 41.9  PLT 257  --  265   BMET Recent Labs    02/20/24 0156 02/21/24 0445  NA 136 141  K 3.8 4.2  CL 101 104  CO2 22 25  GLUCOSE 195* 124*  BUN 13 16  CREATININE 1.21 1.09  CALCIUM  8.7* 8.7*   PT/INR No results for input(s): LABPROT, INR in the last 72 hours. CMP     Component Value Date/Time   NA 141 02/21/2024 0445   K 4.2 02/21/2024 0445   CL 104 02/21/2024 0445   CO2 25 02/21/2024 0445   GLUCOSE 124 (H) 02/21/2024 0445   BUN 16 02/21/2024 0445   CREATININE 1.09 02/21/2024 0445   CALCIUM  8.7 (L) 02/21/2024 0445   PROT 5.8 (L) 02/21/2024 0445   ALBUMIN  3.6 02/21/2024 0445   AST 32 02/21/2024 0445   ALT 92 (H) 02/21/2024 0445   ALKPHOS 68 02/21/2024 0445   BILITOT 0.8 02/21/2024 0445   GFRNONAA >60 02/21/2024 0445   GFRAA >60 01/31/2015 1224   Lipase     Component Value Date/Time   LIPASE 17 02/20/2024  0156    Studies/Results: DG ERCP Result Date: 02/21/2024 CLINICAL DATA:  Common bile duct stones EXAM: ERCP 16 cine loops of the right upper quadrant TECHNIQUE: Multiple spot images obtained with the fluoroscopic device and submitted for interpretation post-procedure. FLUOROSCOPY: Radiation Exposure Index (as provided by the fluoroscopic device): 81.43 mGy Kerma COMPARISON:  None Available. FINDINGS: Images of the right upper quadrant obtained intra procedural demonstrate flexible endoscopy device with guidewire into the pancreatic duct. Subsequent cine loops demonstrate dual wire technique and contrast injection into the common bile duct. Contrast flows into the bile duct with numerous filling defects. Contrast can also be seen flowing into the cystic duct and the gallbladder with filling defects consistent with gallstones. No significant ductal dilatation or intrahepatic ductal dilatation. Interval placement of a pancreatic plastic stent. Balloon sweep of the common bile duct. IMPRESSION: ERCP with balloon sweep of the common bile duct. Interval placement of a plastic pancreatic duct stent. These images were submitted for radiologic interpretation only. Please see the procedural report for the amount of contrast and the fluoroscopy time utilized. Electronically Signed   By: Cordella Banner   On: 02/21/2024 09:16   DG C-Arm 1-60 Min-No Report Result Date: 02/19/2024 Fluoroscopy was  utilized by the requesting physician.  No radiographic interpretation.   DG C-Arm 1-60 Min-No Report Result Date: 02/19/2024 Fluoroscopy was utilized by the requesting physician.  No radiographic interpretation.    Anti-infectives: Anti-infectives (From admission, onward)    Start     Dose/Rate Route Frequency Ordered Stop   02/19/24 0000  piperacillin -tazobactam (ZOSYN ) IVPB 3.375 g        3.375 g 12.5 mL/hr over 240 Minutes Intravenous Every 8 hours 02/18/24 1911          Assessment/Plan 63 yo male with  choledocholithiasis, pancreatitis s/p ERCP complicated by anaphylaxis  - ERCP 12/21 with biliary sphincterotomy, sweeping with removal of sludge and placement of plastic pancreatitic stent.  - With new MEG/RUQ pain will check lipase to ensure he has not develop post ERCP pancreatitis - Per ERCP report, patient had contrast that extended to the gallbladder during ERCP which argues against Cholecystitis. He is afebrile. WBC pending this AM.  - LFT's downtrending with normal alk phos and T. Bili - Ideally, we would perform a cholecystectomy here to prevent reoccurrence. However, in light of his recent allergic anaphylactic reaction during ERCP requiring him to remain intubated post op requiring ongoing steroids, I think it would be best to delay this and have him see us  as an outpatient to discuss elective cholecystectomy after he has been able to formally see an allergist as an outpatient to help identify the culprit of his allergic anaphylactic reaction.  - We will follow with you today to ensure he is able to tolerate diet advancement   FEN: CLD. If Lipase is wnl, can advance to [facility] diet.  VTE: SCDs, Lovenox  ID: Zosyn  per primary/GI. He does not need abx from our standpoint.   I reviewed nursing notes, Consultant (GI) notes, hospitalist notes, last 24 h vitals and pain scores, last 48 h intake and output, last 24 h labs and trends, and last 24 h imaging results.   LOS: 3 days    Ozell CHRISTELLA Shaper, [facility] 02/21/2024, 11:24 AM Please see Amion for pager number during day hours 7:00am-4:30pm  "

## 2024-02-21 NOTE — Telephone Encounter (Signed)
 KUB order placed. Patient is currently hospitalized, will contact patient once d/c.

## 2024-02-21 NOTE — Progress Notes (Signed)
 "  PROGRESS NOTE    Jonathan Simon  FMW:[id number] DOB: 1960/10/12 DOA: 02/18/2024 PCP: Marylynn Verneita CROME, MD   Brief Narrative: Jonathan Simon is a 63 y.o. male with a history of primary hypertension, hyperlipidemia, obesity, alcohol use, emphysema.  Patient presented secondary to chest and abdominal pain, found to have evidence of gallstone pancreatitis. Patient underwent successful ERCP with pancreatic duct stent placement. Hospitalization complicated by development of anaphylaxis requiring a short ICU admission on 12/21 with transfer out on 12/22. General surgery deferring laparoscopic cholecystectomy.   Assessment and Plan:  Acute gallstone pancreatitis Patient with associated cholelithiasis and choledocholithiasis. Lipase of >2000 initially. General surgery consulted with initial plan for a laparoscopic cholecystectomy, but are now deferring to outpatient management secondary to anaphylaxis reaction. Patient has been tolerating a clear liquid diet. - Advance diet - Outpatient general surgery follow-up  Acute choledocholithiasis Gastroenterology consulted and performed an ERCP on 12/21 with biliary sphincterotomy and plastic stent placement into the ventral pancreatic duct. - GI recommendations: abdominal x-ray in 2 weeks to ensure pancreatic duct stent fell out - Outpatient allergist follow-up  Anaphylactic reaction Occurred after anesthesia for ERCP on 12/21 and involved lip swelling, flush faced, and a diffuse macular rash. Presumed secondary to rocuronium . Patient received levophed , epinephrine  and benadryl  intraoperatively and remained intubated post-procedure. He was admitted to ICU for management. Patient started on Solu-medrol , benadryl  and epinephrine  for management. Symptoms improved and patient extubated successfully.  Primary hypertension Prior to arrival medication(s) include amlodipine , losartan  and spironolactone , which were held on admission. Blood pressure mostly  normotensive.  Hepatic steatosis Likely related to history of alcohol use. - Outpatient follow-up  Hypokalemia Resolved with repletion  Aortic atherosclerosis Noted.    DVT prophylaxis: Lovenox  Code Status:   Code Status: Full Code Family Communication: None at bedside Disposition Plan: Discharge home likely in 1-2 days pending ability to advance diet; general surgery do not plan for cholecystectomy while inpatient   Consultants:  Gastroenterology   Procedures:  ERCP  Antimicrobials: Zosyn  IV    Subjective: Patient reports no significant abdominal pain.  Objective: BP 129/80 (BP Location: Left Arm)   Pulse 72   Temp 98.6 F (37 C) (Oral)   Resp 15   Ht 5' 11 (1.803 m)   Wt 93.1 kg   SpO2 96%   BMI 28.63 kg/m   Examination:  General exam: Appears calm and comfortable. Respiratory system: Clear to auscultation. Respiratory effort normal. Cardiovascular system: S1 & S2 heard, RRR. Gastrointestinal system: Abdomen is slightly distended, soft and nontender. Normal bowel sounds heard. Central nervous system: Alert and oriented. No focal neurological deficits. Musculoskeletal: No edema. No calf tenderness Psychiatry: Judgement and insight appear normal. Mood & affect appropriate.    Data Reviewed: I have personally reviewed following labs and imaging studies  CBC Lab Results  Component Value Date   WBC 11.1 (H) 02/21/2024   RBC 4.43 02/21/2024   HGB 13.2 02/21/2024   HCT 39.7 02/21/2024   MCV 89.6 02/21/2024   MCH 29.8 02/21/2024   PLT 237 02/21/2024   MCHC 33.2 02/21/2024   RDW 15.9 (H) 02/21/2024   LYMPHSABS 1.7 12/14/2023   MONOABS 0.8 12/14/2023   EOSABS 0.1 12/14/2023   BASOSABS 0.0 12/14/2023     Last metabolic panel Lab Results  Component Value Date   NA 141 02/21/2024   K 4.2 02/21/2024   CL 104 02/21/2024   CO2 25 02/21/2024   BUN 16 02/21/2024   CREATININE 1.09 02/21/2024  GLUCOSE 124 (H) 02/21/2024   GFRNONAA >60 02/21/2024    GFRAA >60 01/31/2015   CALCIUM  8.7 (L) 02/21/2024   PHOS 6.1 (H) 04/30/2021   PROT 5.8 (L) 02/21/2024   ALBUMIN  3.6 02/21/2024   BILITOT 0.8 02/21/2024   ALKPHOS 68 02/21/2024   AST 32 02/21/2024   ALT 92 (H) 02/21/2024   ANIONGAP 12 02/21/2024    GFR: Estimated Creatinine Clearance: 78.7 mL/min (by C-G formula based on SCr of 1.09 mg/dL).  Recent Results (from the past 240 hours)  MRSA Next Gen by PCR, Nasal     Status: None   Collection Time: 02/19/24  4:07 PM   Specimen: Nasal Mucosa; Nasal Swab  Result Value Ref Range Status   MRSA by PCR Next Gen NOT DETECTED NOT DETECTED Final    Comment: (NOTE) The GeneXpert MRSA Assay (FDA approved for NASAL specimens only), is one component of a comprehensive MRSA colonization surveillance program. It is not intended to diagnose MRSA infection nor to guide or monitor treatment for MRSA infections. Test performance is not FDA approved in patients less than 5 years old. Performed at [facility], [address] [place], [place] [address]       Radiology Studies: No results found.    LOS: 3 days    Elgin Lam, MD [facility] 02/21/2024, 2:17 PM   If 7PM-7AM, please contact night-coverage [email or web address]  "

## 2024-02-21 NOTE — Telephone Encounter (Signed)
-----   Message from Inocente Hausen, MD sent at 02/21/2024  7:48 AM EST ----- Regarding: Outpatient KUB in 2 weeks Christy/POD B -  I was not sure who to route this message to you so I am sending it to both.  Mr. Dowdell was seen inpatient for choledocholithiasis -he does not need a clinic appointment but does need a KUB in 2 weeks to confirm that his pancreatic stent placed by Dr. Rollin has fallen out.  Can you please coordinate scheduling of the KUB?  Thanks,  Inocente

## 2024-02-21 NOTE — Plan of Care (Signed)
  Problem: Education: Goal: Knowledge of Pancreatitis treatment and prevention will improve Outcome: Progressing   Problem: Health Behavior/Discharge Planning: Goal: Ability to formulate a plan to maintain an alcohol-free life will improve Outcome: Progressing   

## 2024-02-22 ENCOUNTER — Other Ambulatory Visit (HOSPITAL_COMMUNITY): Payer: Self-pay

## 2024-02-22 DIAGNOSIS — T782XXA Anaphylactic shock, unspecified, initial encounter: Secondary | ICD-10-CM

## 2024-02-22 LAB — COMPREHENSIVE METABOLIC PANEL WITH GFR
ALT: 98 U/L — ABNORMAL HIGH (ref 0–44)
AST: 34 U/L (ref 15–41)
Albumin: 3.8 g/dL (ref 3.5–5.0)
Alkaline Phosphatase: 66 U/L (ref 38–126)
Anion gap: 7 (ref 5–15)
BUN: 21 mg/dL (ref 8–23)
CO2: 29 mmol/L (ref 22–32)
Calcium: 8.9 mg/dL (ref 8.9–10.3)
Chloride: 105 mmol/L (ref 98–111)
Creatinine, Ser: 0.95 mg/dL (ref 0.61–1.24)
GFR, Estimated: >60 mL/min
Glucose, Bld: 130 mg/dL — ABNORMAL HIGH (ref 70–99)
Potassium: 4.7 mmol/L (ref 3.5–5.1)
Sodium: 141 mmol/L (ref 135–145)
Total Bilirubin: 0.8 mg/dL (ref 0.0–1.2)
Total Protein: 6 g/dL — ABNORMAL LOW (ref 6.5–8.1)

## 2024-02-22 LAB — GLUCOSE, CAPILLARY
Glucose-Capillary: 120 mg/dL — ABNORMAL HIGH (ref 70–99)
Glucose-Capillary: 106 mg/dL — ABNORMAL HIGH (ref 70–99)
Glucose-Capillary: 109 mg/dL — ABNORMAL HIGH (ref 70–99)

## 2024-02-22 MED ORDER — EPINEPHRINE 0.3 MG/0.3ML IJ SOAJ
0.3000 mg | INTRAMUSCULAR | 1 refills | Status: AC | PRN
  Filled 2024-02-22: qty 1, 30d supply, fill #0

## 2024-02-22 NOTE — Care Management (Signed)
 Discharging today no needs identified has follow up appts

## 2024-02-22 NOTE — Discharge Summary (Addendum)
 Physician Discharge Summary  [name] FMW:[id number] DOB: 1960/09/12 DOA: 02/18/2024  PCP: Marylynn Verneita CROME, MD  Admit date: 02/18/2024 Discharge date: 02/22/2024  Time spent: 40 minutes  Recommendations for Outpatient Follow-up:  Follow outpatient CBC/CMP  Follow with allergist outpatient - anaphylaxis with unclear cause (suspected rocuronium ? Unclear) - discharged with epipen  Needs general surgery follow up outpatient KUB in 2 weeks to ensure passage of pancreatic duct stent   Discharge Diagnoses:  Active Problems:   Hypertension   Hyperlipidemia with target LDL less than 70   Obesity (BMI 30.0-34.9)   Benign prostatic hyperplasia   History of alcohol abuse   Emphysema lung (HCC)   Acute cholecystitis   Choledocholithiasis   Acute pancreatitis   Anaphylactic syndrome   Discharge Condition: stable  Diet recommendation: heart healthy   Filed Weights   02/18/24 1832 02/19/24 1507  Weight: 92.2 kg 93.1 kg    History of present illness:  Jonathan Simon is Jonathan Simon 63 y.o. male with Dacota Ruben history of primary hypertension, hyperlipidemia, obesity, alcohol use, emphysema.  Patient presented secondary to chest and abdominal pain, found to have evidence of gallstone pancreatitis. Patient underwent successful ERCP with pancreatic duct stent placement. Hospitalization complicated by development of anaphylaxis requiring Onyx Schirmer short ICU admission on 12/21 with transfer out on 12/22. General surgery deferring laparoscopic cholecystectomy in setting of anaphylaxis.  Stable for discharge 12/24.    Hospital Course:  Assessment and Plan:  Acute gallstone pancreatitis Patient with associated cholelithiasis and choledocholithiasis. Lipase of >2000 initially.  CT with findings c/w pancreatitis, cholelithiasis with gallbladder wall thickening RUQ US  concerning for cholecystitis, but per gen surg, contrast extending to gallbladder during ERCP argues against cholecystitis General surgery  consulted with initial plan for Esias Mory laparoscopic cholecystectomy, but are now deferring to outpatient management secondary to anaphylaxis reaction.  - tolerating soft diet - Outpatient general surgery follow-up after he's seen allergies   Acute choledocholithiasis Gastroenterology consulted and performed an ERCP on 12/21 with biliary sphincterotomy and plastic stent placement into the ventral pancreatic duct. - GI recommendations: abdominal x-ray in 2 weeks to ensure pancreatic duct stent fell out   Anaphylactic reaction Occurred after anesthesia for ERCP on 12/21 and involved lip swelling, flush faced, and Jaxsyn Azam diffuse macular rash. Presumed secondary to rocuronium , though it's unclear and difficult to say definitively.  Patient received levophed , epinephrine  and benadryl  intraoperatively and remained intubated post-procedure. He was admitted to ICU for management. Patient started on Solu-medrol , benadryl  and epinephrine  for management. Symptoms improved and patient extubated successfully. Will discharge with epipen  and allergy referral    Primary hypertension Resume amlodipine , losartan  and spironolactone  at discharge   Hepatic steatosis Likely related to history of alcohol use. - Outpatient follow-up   Hypokalemia resolved   Aortic atherosclerosis Noted.    Procedures: ERCP   Impression: - The major papilla appeared normal. - Alwaleed Obeso filling defect consistent with Promyse Ardito stone was seen on the cholangiogram. - Philemon Riedesel biliary sphincterotomy was performed. - The biliary tree was swept and sludge was found. - One plastic pancreatic stent was placed into the ventral pancreatic duct. Recommendation: - Transfer to ICU. - Treatment for the anaphylaxis per ICU. - Clear liquid diet. - KUB in two weeks to ensure that the PD stent fell out.  Lap chole per surgery.  Consultations: GI Surgery Critical care  Discharge Exam: Vitals:   02/22/24 0751 02/22/24 1153  BP: (!) 158/91 (!) 148/88  Pulse: (!) 46 60   Resp: 18 17  Temp: 98.5 F (  36.9 C) 98.7 F (37.1 C)  SpO2: 100% 100%   No complaints, eager to discharge  General: No acute distress. Cardiovascular: Heart sounds show Keiara Sneeringer regular rate, and rhythm.  Lungs: Clear to auscultation bilaterally  Abdomen: Soft, nontender, nondistended  Neurological: Alert and oriented 3. Moves all extremities 4 with equal strength. Cranial nerves II through XII grossly intact. Extremities: No clubbing or cyanosis. No edema.  Discharge Instructions   Discharge Instructions     Ambulatory referral to Allergy   Complete by: As directed    Anaphylactic reaction during ERCP. Would like to be seen before appointment with surgery to discuss elective cholecystectomy if possible. Thank you!   Call MD for:  difficulty breathing, headache or visual disturbances   Complete by: As directed    Call MD for:  extreme fatigue   Complete by: As directed    Call MD for:  hives   Complete by: As directed    Call MD for:  persistant dizziness or light-headedness   Complete by: As directed    Call MD for:  persistant nausea and vomiting   Complete by: As directed    Call MD for:  redness, tenderness, or signs of infection (pain, swelling, redness, odor or green/yellow discharge around incision site)   Complete by: As directed    Call MD for:  severe uncontrolled pain   Complete by: As directed    Call MD for:  temperature >100.4   Complete by: As directed    Discharge instructions   Complete by: As directed    You were seen for gallstone pancreatitis.  You've improved.  You had an ERCP with Calen Posch biliary sphincterotomy and Ramandeep Arington plastic pancreatic stent placed.  You'll need an x ray in 2 weeks to ensure the pancreatic duct stent comes out.    General surgery evaluated you for Leauna Sharber cholecystectomy (gallbladder removal surgery), but given your allergic reaction (anaphylaxis), they plan to follow you outpatient.   Your ERCP was complicated by an anaphylactic reaction.   It's not exactly clear what caused your allergic reaction, but the suspicion is possibly the rocuronium .  I added this to your allergy list for now.  You'll need to see an allergist as an outpatient.  I'll send you home with an epipen  in the case of an allergic reaction.  Avoid alcohol.  This can cause pancreatitis.    Return for new, recurrent, or worsening symptoms.  Please ask your PCP to request records from this hospitalization so they know what was done and what the next steps will be.   Increase activity slowly   Complete by: As directed       Allergies as of 02/22/2024       Reactions   Latex Rash   Rocuronium     Unclear, suspected culprit - anaphylaxis while under anesthesia        Medication List     TAKE these medications    albuterol  108 (90 Base) MCG/ACT inhaler Commonly known as: VENTOLIN  HFA Inhale 2 puffs into the lungs every 6 (six) hours as needed for wheezing.   amLODipine  10 MG tablet Commonly known as: NORVASC  Take 1 tablet (10 mg total) by mouth daily.   EPINEPHrine  0.3 mg/0.3 mL Soaj injection Commonly known as: EPI-PEN Inject 0.3 mg into the muscle as needed for anaphylaxis.   losartan  100 MG tablet Commonly known as: COZAAR  Take 1 tablet (100 mg total) by mouth daily.   rosuvastatin  10 MG tablet Commonly known as: CRESTOR   Take 1 tablet (10 mg total) by mouth daily.   spironolactone  50 MG tablet Commonly known as: ALDACTONE  Take 1 tablet (50 mg total) by mouth daily.   traZODone  50 MG tablet Commonly known as: DESYREL  Take 1 tablet (50 mg total) by mouth at bedtime.       Allergies[1]  Follow-up Information     [facility], [facility] Follow up.   Specialty: General Surgery Why: Call to confirm your appointment date and time, bring Spencer Cardinal copy of your photo ID and insurance card, arrive 30 minutes prior to your appointment.  Please follow up with an allergist before your appointment. We have made Destony Prevost referral for this. Contact  information: [address] [place] [address] [phone]         Rollin Dover, MD Follow up.   Specialty: Gastroenterology Why: follow up as an outpatient, you'll need Sashia Campas repeat abdominal x ray in 2 weeks to ensure the pancreatic stent passes Contact information: [address] [place] [address] [phone]         Marylynn Verneita CROME, MD Follow up.   Specialty: Internal Medicine Contact information: [address] [place] [address] [phone]         [facility] Follow up.   Specialty: Allergy and Asthma  Why: We've placed Deago Burruss referral to the [facility] practice.  Call if you haven't heard from them within 1 week. Contact information: [address] [place]  [address] [phone]                 The results of significant diagnostics from this hospitalization (including imaging, microbiology, ancillary and laboratory) are listed below for reference.    Significant Diagnostic Studies: DG ERCP Result Date: 02/21/2024 CLINICAL DATA:  Common bile duct stones EXAM: ERCP 16 cine loops of the right upper quadrant TECHNIQUE: Multiple spot images obtained with the fluoroscopic device and submitted for interpretation post-procedure. FLUOROSCOPY: Radiation Exposure Index (as provided by the fluoroscopic device): 81.43 mGy Kerma COMPARISON:  None Available. FINDINGS: Images of the right upper quadrant obtained intra procedural demonstrate flexible endoscopy device with guidewire into the pancreatic duct. Subsequent cine loops demonstrate dual wire technique and contrast injection into the common bile duct. Contrast flows into the bile duct with numerous filling defects. Contrast can also be seen flowing into the cystic duct and the gallbladder with filling defects consistent with gallstones. No significant ductal dilatation or intrahepatic ductal dilatation. Interval  placement of Carlethia Mesquita pancreatic plastic stent. Balloon sweep of the common bile duct. IMPRESSION: ERCP with balloon sweep of the common bile duct. Interval placement of Jameal Razzano plastic pancreatic duct stent. These images were submitted for radiologic interpretation only. Please see the procedural report for the amount of contrast and the fluoroscopy time utilized. Electronically Signed   By: Cordella Banner   On: 02/21/2024 09:16   DG C-Arm 1-60 Min-No Report Result Date: 02/19/2024 Fluoroscopy was utilized by the requesting physician.  No radiographic interpretation.   DG C-Arm 1-60 Min-No Report Result Date: 02/19/2024 Fluoroscopy was utilized by the requesting physician.  No radiographic interpretation.   US  Abdomen Limited RUQ (LIVER/GB) Result Date: 02/17/2024 CLINICAL DATA:  Right upper quadrant pain. EXAM: ULTRASOUND ABDOMEN LIMITED RIGHT UPPER QUADRANT COMPARISON:  CT earlier today.  Ultrasound 08/11/2021 FINDINGS: Gallbladder: Moderately distended. Gallbladder wall thickening measuring up to 4.8 mm. Intraluminal gallstones. Small amount of pericholecystic fluid. No sonographic Murphy sign noted by  sonographer. Common bile duct: Diameter: 5 mm. Suspected 6 mm stone in the distal common bile duct. Liver: No focal lesion identified. Borderline increased in parenchymal echogenicity. Portal vein is patent on color Doppler imaging with normal direction of blood flow towards the liver. Other: None. IMPRESSION: 1. Suspected choledocholithiasis with 6 mm stone in the distal common bile duct. 2. Findings suspicious for acute cholecystitis. Distended gallbladder with gallstones, gallbladder wall thickening and small amount of pericholecystic fluid. Electronically Signed   By: Andrea Gasman M.D.   On: 02/17/2024 19:19   CT ABDOMEN PELVIS W CONTRAST Result Date: 02/17/2024 CLINICAL DATA:  Epigastric pain and chest pain. EXAM: CT ABDOMEN AND PELVIS WITH CONTRAST TECHNIQUE: Multidetector CT imaging of the abdomen  and pelvis was performed using the standard protocol following bolus administration of intravenous contrast. RADIATION DOSE REDUCTION: This exam was performed according to the departmental dose-optimization program which includes automated exposure control, adjustment of the mA and/or kV according to patient size and/or use of iterative reconstruction technique. CONTRAST:  OMNIPAQUE  IOHEXOL  300 MG/ML  SOLN COMPARISON:  April 30, 2021 FINDINGS: Lower chest: Pleural based calcifications are seen within the right lung base. Hepatobiliary: No focal liver abnormality is seen. Subcentimeter gallstones are seen within the gallbladder lumen. Diffuse gallbladder wall thickening is noted without pericholecystic inflammatory fat stranding or biliary dilatation. Pancreas: The pancreatic head is mildly enlarged without definite evidence of Bradlee Bridgers pancreatic mass. There is no evidence of pancreatic ductal dilatation. Mild peripancreatic inflammatory fat stranding is noted along the posterior and inferior aspect of the pancreatic body and tail. Spleen: Normal in size without focal abnormality. Adrenals/Urinary Tract: Adrenal glands are unremarkable. Kidneys are normal in size, without renal calculi or hydronephrosis. Jemel Ono 2.2 cm simple cyst is seen within the left kidney. Bladder is unremarkable. Stomach/Bowel: Mild inflammatory fat stranding is seen along the posterior aspect of the gastric body and gastric antrum. This may extend from the previously noted peripancreatic inflammatory fat stranding. The appendix is not clearly identified. No evidence of bowel wall thickening, distention, or inflammatory changes. Noninflamed diverticula are seen throughout the sigmoid colon. Vascular/Lymphatic: Aortic atherosclerosis. No enlarged abdominal or pelvic lymph nodes. Reproductive: Prostate is unremarkable. Other: No abdominal wall hernia or abnormality. No abdominopelvic ascites. Musculoskeletal: No acute or significant osseous findings.  IMPRESSION: 1. Findings suspicious for acute pancreatitis. Correlation with pancreatic enzymes is recommended. 2. Cholelithiasis with gallbladder wall thickening. Further evaluation with right upper quadrant ultrasound is recommended if acute cholecystitis is of clinical concern. 3. Sigmoid diverticulosis. 4. Aortic atherosclerosis. Electronically Signed   By: Suzen Dials M.D.   On: 02/17/2024 17:10   DG Chest 2 View Result Date: 02/17/2024 EXAM: 2 VIEW(S) XRAY OF THE CHEST 02/17/2024 12:09:00 PM COMPARISON: 08/26/22 CLINICAL HISTORY: chest pain FINDINGS: LUNGS AND PLEURA: Right lung suture chain within the perihilar region. Increased peripheral septal markings within the lower lung zones. Blunting of the costophrenic angles. Hazy opacity in right lower lung zone adjacent to right heart border. Calcified pleural plaques overlie the right lung. No pneumothorax. HEART AND MEDIASTINUM: No acute abnormality of the cardiac and mediastinal silhouettes. BONES AND SOFT TISSUES: Chronic compression fracture of midthoracic vertebral body new compared with 08/26/2022. IMPRESSION: 1. Hazy opacity in the right lower lung zone adjacent to the right heart border, which may reflect atelectasis, airspace disease, or asymmetric edema. 2. Increased peripheral septal markings within the lower lung zones and blunting of the costophrenic angles. Correlate for any clinical signs or symptoms of mild chf. 3. Chronic  compression fracture of midthoracic vertebral body, new compared with 08/26/2022. Electronically signed by: Waddell Calk MD 02/17/2024 02:14 PM EST RP Workstation: [id number]    Microbiology: Recent Results (from the past 240 hours)  MRSA Next Gen by PCR, Nasal     Status: None   Collection Time: 02/19/24  4:07 PM   Specimen: Nasal Mucosa; Nasal Swab  Result Value Ref Range Status   MRSA by PCR Next Gen NOT DETECTED NOT DETECTED Final    Comment: (NOTE) The GeneXpert MRSA Assay (FDA approved for NASAL  specimens only), is one component of Anaia Frith comprehensive MRSA colonization surveillance program. It is not intended to diagnose MRSA infection nor to guide or monitor treatment for MRSA infections. Test performance is not FDA approved in patients less than 83 years old. Performed at [facility], [address], [place], [place] [address]      Labs: Basic Metabolic Panel: Recent Labs  Lab 02/18/24 1941 02/19/24 0313 02/19/24 1727 02/20/24 0156 02/21/24 0445 02/22/24 0348  NA 139 136 136 136 141 141  K 3.7 3.5 3.3* 3.8 4.2 4.7  CL 102 101  --  101 104 105  CO2 29 25  --  22 25 29   GLUCOSE 74 114*  --  195* 124* 130*  BUN 11 10  --  13 16 21   CREATININE 1.08 0.96  --  1.21 1.09 0.95  CALCIUM  8.9 8.8*  --  8.7* 8.7* 8.9   Liver Function Tests: Recent Labs  Lab 02/18/24 1941 02/19/24 0313 02/20/24 0156 02/21/24 0445 02/22/24 0348  AST 85* 75* 42* 32 34  ALT 197* 175* 113* 92* 98*  ALKPHOS 99 100 68 68 66  BILITOT 1.8* 1.9* 1.3* 0.8 0.8  PROT 6.2* 6.4* 5.8* 5.8* 6.0*  ALBUMIN  3.8 3.7 3.6 3.6 3.8   Recent Labs  Lab 02/17/24 1508 02/20/24 0156 02/21/24 1150  LIPASE >2,800* 17 30   No results for input(s): AMMONIA in the last 168 hours. CBC: Recent Labs  Lab 02/17/24 1158 02/18/24 1941 02/19/24 0313 02/19/24 1727 02/20/24 0819 02/21/24 1150  WBC 11.9* 10.2 9.5  --  12.8* 11.1*  HGB 16.2 14.2 14.0 14.6 14.4 13.2  HCT 49.7 43.0 41.7 43.0 41.9 39.7  MCV 88.3 89.2 88.2  --  86.7 89.6  PLT 369 250 257  --  265 237   Cardiac Enzymes: No results for input(s): CKTOTAL, CKMB, CKMBINDEX, TROPONINI in the last 168 hours. BNP: BNP (last 3 results) No results for input(s): BNP in the last 8760 hours.  ProBNP (last 3 results) Recent Labs    02/17/24 1508  PROBNP 70.9    CBG: Recent Labs  Lab 02/21/24 2027 02/21/24 2322 02/22/24 0403 02/22/24 0752 02/22/24 1149  GLUCAP 139* 134* 120* 106* 109*       Signed:  Meliton Monte MD.   [facility] 02/22/2024, 2:29 PM       [1]  Allergies Allergen Reactions   Latex Rash   Rocuronium      Unclear, suspected culprit - anaphylaxis while under anesthesia

## 2024-02-22 NOTE — Progress Notes (Signed)
 "   3 Days Post-Op  Subjective: CC: Abdominal pain resolved. Tolerating soft diet without abdominal pain, n/v. Asking when he can go home.   Objective: Vital signs in last 24 hours: Temp:  [98.1 F (36.7 C)-98.8 F (37.1 C)] 98.5 F (36.9 C) (12/24 0751) Pulse Rate:  [46-72] 46 (12/24 0751) Resp:  [15-20] 18 (12/24 0751) BP: (123-158)/(71-91) 158/91 (12/24 0751) SpO2:  [95 %-100 %] 100 % (12/24 0751) Last BM Date : 02/21/24  Intake/Output from previous day: 12/23 0701 - 12/24 0700 In: 470 [P.O.:470] Out: 400 [Urine:400] Intake/Output this shift: No intake/output data recorded.  PE: Gen:  Alert, NAD, pleasant Abd: Soft, ND, NT  Lab Results:  Recent Labs    02/20/24 0819 02/21/24 1150  WBC 12.8* 11.1*  HGB 14.4 13.2  HCT 41.9 39.7  PLT 265 237   BMET Recent Labs    02/21/24 0445 02/22/24 0348  NA 141 141  K 4.2 4.7  CL 104 105  CO2 25 29  GLUCOSE 124* 130*  BUN 16 21  CREATININE 1.09 0.95  CALCIUM  8.7* 8.9   PT/INR No results for input(s): LABPROT, INR in the last 72 hours. CMP     Component Value Date/Time   NA 141 02/22/2024 0348   K 4.7 02/22/2024 0348   CL 105 02/22/2024 0348   CO2 29 02/22/2024 0348   GLUCOSE 130 (H) 02/22/2024 0348   BUN 21 02/22/2024 0348   CREATININE 0.95 02/22/2024 0348   CALCIUM  8.9 02/22/2024 0348   PROT 6.0 (L) 02/22/2024 0348   ALBUMIN  3.8 02/22/2024 0348   AST 34 02/22/2024 0348   ALT 98 (H) 02/22/2024 0348   ALKPHOS 66 02/22/2024 0348   BILITOT 0.8 02/22/2024 0348   GFRNONAA >60 02/22/2024 0348   GFRAA >60 01/31/2015 1224   Lipase     Component Value Date/Time   LIPASE 30 02/21/2024 1150    Studies/Results: No results found.   Anti-infectives: Anti-infectives (From admission, onward)    Start     Dose/Rate Route Frequency Ordered Stop   02/19/24 0000  piperacillin -tazobactam (ZOSYN ) IVPB 3.375 g        3.375 g 12.5 mL/hr over 240 Minutes Intravenous Every 8 hours 02/18/24 1911           Assessment/Plan 63 yo male with choledocholithiasis, pancreatitis s/p ERCP complicated by anaphylaxis  - ERCP 12/21 with biliary sphincterotomy, sweeping with removal of sludge and placement of plastic pancreatitic stent.  - Per ERCP report, patient had contrast that extended to the gallbladder during ERCP which argues against Cholecystitis. He is afebrile. WBC downtrending on last check. Tolerating diet. NT.  - LFT's stable with normal alk phos and T. Bili - Ideally, we would perform a cholecystectomy here to prevent reoccurrence. However, in light of his recent allergic anaphylactic reaction during ERCP requiring him to remain intubated post op requiring ongoing steroids, I think it would be best to delay this and have him see us  as an outpatient to discuss elective cholecystectomy after he has been able to formally see an allergist as an outpatient to help identify the culprit of his allergic anaphylactic reaction.  - I have sent for follow up. Also referred to an allergist so he can be seen before his appointment with us . He is okay for discharge from our standpoint. Message sent to [facility]. We will sign off. Please call back with questions or concerns.    FEN: Soft VTE: SCDs, Lovenox  ID: [email or web address]  per primary/GI. He does not  need abx from our standpoint.   I reviewed nursing notes, Consultant (GI) notes, hospitalist notes, last 24 h vitals and pain scores, last 48 h intake and output, last 24 h labs and trends, and last 24 h imaging results.   LOS: 4 days    Ozell CHRISTELLA Shaper, [facility] 02/22/2024, 10:44 AM Please see Amion for pager number during day hours 7:00am-4:30pm  "

## 2024-02-24 ENCOUNTER — Telehealth: Payer: Self-pay

## 2024-02-24 NOTE — Telephone Encounter (Signed)
 Left message for patient to call back

## 2024-02-24 NOTE — Transitions of Care (Post Inpatient/ED Visit) (Signed)
" ° °  02/24/2024  Name: Jonathan Simon MRN: [id number] DOB: 13-Jun-1960  Today's TOC FU Call Status: Today's TOC FU Call Status:: Successful TOC FU Call Completed TOC FU Call Complete Date: 02/24/24  Attempted to reach the patient regarding the most recent Inpatient/ED visit.  Follow Up Plan: Additional outreach attempts will be made to reach the patient to complete the Transitions of Care (Post Inpatient/ED visit) call.   Arvin Seip RN, BSN, CCM [employer], Population Health Case Manager Phone: [phone]  "

## 2024-02-27 ENCOUNTER — Telehealth: Payer: Self-pay

## 2024-02-27 NOTE — Transitions of Care (Post Inpatient/ED Visit) (Signed)
 "  02/27/2024  Name: Jonathan Simon MRN: [id number] DOB: September 18, 1960  Today's TOC FU Call Status:    Patient's Name and Date of Birth confirmed. Name, DOB  Transition Care Management Follow-up Telephone Call Date of Discharge: 02/22/24 Discharge Facility: [facility]) Type of Discharge: Inpatient Admission Primary Inpatient Discharge Diagnosis:: acute gallstones pancreatitis How have you been since you were released from the hospital?: Better Any questions or concerns?: No  Items Reviewed: Did you receive and understand the discharge instructions provided?: Yes Medications obtained,verified, and reconciled?: Yes (Medications Reviewed) Any new allergies since your discharge?: No (During my hospital stay I was allergic to something with the anesthesia is my understanding) Dietary orders reviewed?: Yes Do you have support at home?: Yes People in Home [RPT]: alone Name of Support/Comfort Primary Source: Great neighbors and friends  Medications Reviewed Today: Medications Reviewed Today     Reviewed by Eilleen Richerd GRADE, RN (Registered Nurse) on 02/27/24 at 1547  Med List Status: <None>   Medication Order Taking? Sig Documenting Provider Last Dose Status Informant  albuterol  (PROVENTIL  HFA;VENTOLIN  HFA) 108 (90 BASE) MCG/ACT inhaler [id number] Yes Inhale 2 puffs into the lungs every 6 (six) hours as needed for wheezing. Marylynn Verneita CROME, MD  Active Self  amLODipine  (NORVASC ) 10 MG tablet [id number] Yes Take 1 tablet (10 mg total) by mouth daily. Marylynn Verneita CROME, MD  Active Self  EPINEPHrine  0.3 mg/0.3 mL IJ SOAJ injection [id number] Yes Inject 0.3 mg into the muscle as needed for anaphylaxis. Perri DELENA Meliton Mickey., MD  Active   losartan  (COZAAR ) 100 MG tablet [id number] Yes Take 1 tablet (100 mg total) by mouth daily. Marylynn Verneita CROME, MD  Active Self  rosuvastatin  (CRESTOR ) 10 MG tablet [id number] Yes Take 1 tablet (10 mg total) by mouth daily. Marylynn Verneita CROME, MD  Active Self   spironolactone  (ALDACTONE ) 50 MG tablet [id number] Yes Take 1 tablet (50 mg total) by mouth daily. Marylynn Verneita CROME, MD  Active Self           Med Note ZENA NATHANAEL CROME   Fri Feb 17, 2024  6:09 PM) Has not started yet  traZODone  (DESYREL ) 50 MG tablet [id number] Yes Take 1 tablet (50 mg total) by mouth at bedtime. Marylynn Verneita CROME, MD  Active Self            Home Care and Equipment/Supplies: Were Home Health Services Ordered?: NA Any new equipment or medical supplies ordered?: NA  Functional Questionnaire: Do you need assistance with bathing/showering or dressing?: No Do you need assistance with meal preparation?: No Do you need assistance with eating?: No Do you have difficulty maintaining continence: No Do you need assistance with getting out of bed/getting out of a chair/moving?: No Do you have difficulty managing or taking your medications?: No  Follow up appointments reviewed: PCP Follow-up appointment confirmed?: No (I will call and get a follow up) Specialist Hospital Follow-up appointment confirmed?: No Reason Specialist Follow-Up Not Confirmed: Patient has Specialist Provider Number and will Call for Appointment (Patient prefers to make his own appointments) Do you need transportation to your follow-up appointment?: No Do you understand care options if your condition(s) worsen?: Yes-patient verbalized understanding  SDOH Interventions Today    Flowsheet Row Most Recent Value  SDOH Interventions   Food Insecurity Interventions Intervention Not Indicated  Housing Interventions Intervention Not Indicated  Transportation Interventions Intervention Not Indicated  Utilities Interventions Intervention Not Indicated   Education for self-mgmt of DC instructions reviewed per page 2  of AVS with  Call MD for:  difficulty breathing, headache or visual disturbances Call MD for:  extreme fatigue Call MD for:  hives Call MD for:  persistant dizziness or light-headedness Call MD for:   persistant nausea and vomiting Call MD for:  redness, tenderness, or signs of infection (pain, swelling, redness, odor or green/yellow discharge around incision site) Call MD for:  severe uncontrolled pain Call MD for:  temperature >100.4 Discharge instructions You were seen for gallstone pancreatitis.   You had an ERCP with a biliary sphincterotomy and a plastic pancreatic stent placed. You'll need an x ray in 2 weeks to ensure the pancreatic duct stent comes out (pt to call for appointments) provided during this outreach regarding: -s/s of worsening condition and when to seek medical attention -importance of completing all post discharge hospital hospital follow up appts -adherence to med regimen [facility] TOC 30-day program enrollment reviewed and discussed with pt/caregiver. They have declined enrollment in 30 day TOC program due to:  doesn't feel he needs it currently.  Richerd Fish, RN, BSN, CCM [facility], [facility] Management Coordinator Direct Dial: [phone]        "

## 2024-02-28 ENCOUNTER — Telehealth: Payer: Self-pay

## 2024-02-28 NOTE — Telephone Encounter (Signed)
"  Left message for pt to call back   "

## 2024-02-28 NOTE — Telephone Encounter (Signed)
-----   Message from [facility] sent at 02/27/2024  7:15 AM EST ----- Regarding: FW: Outpatient KUB in 2 weeks  ----- Message ----- From: Suzann Inocente HERO, MD Sent: 02/21/2024   7:50 AM EST To: Bari JONETTA Lesches; [facility] Triage Subject: Outpatient KUB in 2 weeks                      Christy/POD B -  I was not sure who to route this message to you so I am sending it to both.  Mr. Minder was seen inpatient for choledocholithiasis -he does not need a clinic appointment but does need a KUB in 2 weeks to confirm that his pancreatic stent placed by Dr. Rollin has fallen out.  Can you please coordinate scheduling of the KUB?  Thanks,  Inocente

## 2024-03-05 NOTE — Telephone Encounter (Signed)
"  Left message for pt to call back   "

## 2024-03-08 NOTE — Telephone Encounter (Signed)
 Multiple attempts have been made to reach patient Carolan, RN has attempted in phone note 12/30 as well). Letter mailed home to patient.

## 2024-03-20 ENCOUNTER — Other Ambulatory Visit: Payer: Self-pay | Admitting: Internal Medicine

## 2024-04-05 ENCOUNTER — Ambulatory Visit: Admitting: Allergy & Immunology

## 2024-04-06 ENCOUNTER — Other Ambulatory Visit: Payer: Self-pay

## 2024-04-06 MED ORDER — ROSUVASTATIN CALCIUM 10 MG PO TABS: 10.0000 mg | ORAL_TABLET | Freq: Every day | ORAL | 1 refills | Status: AC

## 2024-05-03 ENCOUNTER — Ambulatory Visit: Admitting: Allergy

## 2024-08-13 ENCOUNTER — Encounter: Admitting: Internal Medicine
